# Patient Record
Sex: Male | Born: 1960 | Race: White | Hispanic: No | Marital: Married | Smoking: Never smoker
Health system: Southern US, Community
[De-identification: ages and names within clinical notes are randomized; demographics above are authoritative.]

## PROBLEM LIST (undated history)

## (undated) DIAGNOSIS — K56609 Unspecified intestinal obstruction, unspecified as to partial versus complete obstruction: Secondary | ICD-10-CM

## (undated) DIAGNOSIS — R7303 Prediabetes: Secondary | ICD-10-CM

## (undated) DIAGNOSIS — I7121 Aneurysm of the ascending aorta, without rupture: Secondary | ICD-10-CM

## (undated) DIAGNOSIS — I519 Heart disease, unspecified: Secondary | ICD-10-CM

## (undated) DIAGNOSIS — M069 Rheumatoid arthritis, unspecified: Secondary | ICD-10-CM

## (undated) DIAGNOSIS — R600 Localized edema: Secondary | ICD-10-CM

## (undated) DIAGNOSIS — I10 Essential (primary) hypertension: Secondary | ICD-10-CM

## (undated) DIAGNOSIS — E669 Obesity, unspecified: Secondary | ICD-10-CM

## (undated) DIAGNOSIS — M1990 Unspecified osteoarthritis, unspecified site: Secondary | ICD-10-CM

## (undated) DIAGNOSIS — E785 Hyperlipidemia, unspecified: Secondary | ICD-10-CM

## (undated) DIAGNOSIS — R079 Chest pain, unspecified: Secondary | ICD-10-CM

## (undated) DIAGNOSIS — L0390 Cellulitis, unspecified: Secondary | ICD-10-CM

## (undated) DIAGNOSIS — I712 Thoracic aortic aneurysm, without rupture: Secondary | ICD-10-CM

## (undated) DIAGNOSIS — K219 Gastro-esophageal reflux disease without esophagitis: Secondary | ICD-10-CM

## (undated) DIAGNOSIS — J189 Pneumonia, unspecified organism: Secondary | ICD-10-CM

## (undated) DIAGNOSIS — K760 Fatty (change of) liver, not elsewhere classified: Secondary | ICD-10-CM

## (undated) DIAGNOSIS — M549 Dorsalgia, unspecified: Secondary | ICD-10-CM

## (undated) DIAGNOSIS — K769 Liver disease, unspecified: Secondary | ICD-10-CM

## (undated) DIAGNOSIS — G4730 Sleep apnea, unspecified: Secondary | ICD-10-CM

## (undated) DIAGNOSIS — Z87442 Personal history of urinary calculi: Secondary | ICD-10-CM

## (undated) DIAGNOSIS — M2550 Pain in unspecified joint: Secondary | ICD-10-CM

## (undated) HISTORY — PX: HERNIA REPAIR: SHX51

## (undated) HISTORY — DX: Obesity, unspecified: E66.9

## (undated) HISTORY — DX: Dorsalgia, unspecified: M54.9

## (undated) HISTORY — PX: JOINT REPLACEMENT: SHX530

## (undated) HISTORY — DX: Rheumatoid arthritis, unspecified: M06.9

## (undated) HISTORY — DX: Heart disease, unspecified: I51.9

## (undated) HISTORY — DX: Cellulitis, unspecified: L03.90

## (undated) HISTORY — DX: Fatty (change of) liver, not elsewhere classified: K76.0

## (undated) HISTORY — DX: Liver disease, unspecified: K76.9

## (undated) HISTORY — DX: Localized edema: R60.0

## (undated) HISTORY — DX: Prediabetes: R73.03

## (undated) HISTORY — DX: Pain in unspecified joint: M25.50

---

## 2000-07-16 ENCOUNTER — Inpatient Hospital Stay (HOSPITAL_COMMUNITY): Admission: EM | Admit: 2000-07-16 | Discharge: 2000-07-18 | Payer: Self-pay | Admitting: Emergency Medicine

## 2000-07-16 ENCOUNTER — Encounter: Payer: Self-pay | Admitting: Emergency Medicine

## 2000-07-17 ENCOUNTER — Encounter: Payer: Self-pay | Admitting: Internal Medicine

## 2004-10-26 ENCOUNTER — Ambulatory Visit: Payer: Self-pay | Admitting: Family Medicine

## 2008-02-15 ENCOUNTER — Other Ambulatory Visit: Payer: Self-pay

## 2008-02-15 ENCOUNTER — Inpatient Hospital Stay: Payer: Self-pay | Admitting: Surgery

## 2008-02-27 ENCOUNTER — Ambulatory Visit: Payer: Self-pay | Admitting: Surgery

## 2009-08-15 ENCOUNTER — Emergency Department (HOSPITAL_COMMUNITY): Admission: EM | Admit: 2009-08-15 | Discharge: 2009-08-16 | Payer: Self-pay | Admitting: Emergency Medicine

## 2009-12-24 ENCOUNTER — Ambulatory Visit (HOSPITAL_COMMUNITY): Admission: RE | Admit: 2009-12-24 | Discharge: 2009-12-24 | Payer: Self-pay | Admitting: Family Medicine

## 2010-01-24 ENCOUNTER — Emergency Department (HOSPITAL_COMMUNITY): Admission: EM | Admit: 2010-01-24 | Discharge: 2010-01-24 | Payer: Self-pay | Admitting: Emergency Medicine

## 2010-02-02 ENCOUNTER — Ambulatory Visit: Payer: Self-pay | Admitting: Gastroenterology

## 2010-03-03 ENCOUNTER — Ambulatory Visit: Payer: Self-pay | Admitting: Gastroenterology

## 2010-03-08 LAB — PATHOLOGY REPORT

## 2010-09-06 ENCOUNTER — Emergency Department (HOSPITAL_COMMUNITY)
Admission: EM | Admit: 2010-09-06 | Discharge: 2010-09-07 | Payer: Self-pay | Source: Home / Self Care | Admitting: Emergency Medicine

## 2010-09-12 LAB — URINE MICROSCOPIC-ADD ON

## 2010-09-12 LAB — DIFFERENTIAL
Basophils Absolute: 0 10*3/uL (ref 0.0–0.1)
Basophils Relative: 0 % (ref 0–1)
Eosinophils Absolute: 0 10*3/uL (ref 0.0–0.7)
Eosinophils Relative: 0 % (ref 0–5)
Lymphocytes Relative: 5 % — ABNORMAL LOW (ref 12–46)
Lymphs Abs: 0.6 10*3/uL — ABNORMAL LOW (ref 0.7–4.0)
Monocytes Absolute: 1 10*3/uL (ref 0.1–1.0)
Monocytes Relative: 7 % (ref 3–12)
Neutro Abs: 11.8 10*3/uL — ABNORMAL HIGH (ref 1.7–7.7)
Neutrophils Relative %: 88 % — ABNORMAL HIGH (ref 43–77)

## 2010-09-12 LAB — LACTIC ACID, PLASMA
Lactic Acid, Venous: 2.4 mmol/L — ABNORMAL HIGH (ref 0.5–2.2)
Lactic Acid, Venous: 1.5 mmol/L (ref 0.5–2.2)

## 2010-09-12 LAB — CBC
HCT: 51.9 % (ref 39.0–52.0)
Hemoglobin: 18.2 g/dL — ABNORMAL HIGH (ref 13.0–17.0)
MCH: 31.8 pg (ref 26.0–34.0)
MCHC: 35.1 g/dL (ref 30.0–36.0)
MCV: 90.6 fL (ref 78.0–100.0)
Platelets: 165 10*3/uL (ref 150–400)
RBC: 5.73 MIL/uL (ref 4.22–5.81)
RDW: 13.1 % (ref 11.5–15.5)
WBC: 13.4 10*3/uL — ABNORMAL HIGH (ref 4.0–10.5)

## 2010-09-12 LAB — URINALYSIS, ROUTINE W REFLEX MICROSCOPIC
Ketones, ur: NEGATIVE mg/dL
Leukocytes, UA: NEGATIVE
Nitrite: NEGATIVE
Protein, ur: 30 mg/dL — AB
Specific Gravity, Urine: 1.026 (ref 1.005–1.030)
Urine Glucose, Fasting: NEGATIVE mg/dL
Urobilinogen, UA: 0.2 mg/dL (ref 0.0–1.0)
pH: 5.5 (ref 5.0–8.0)

## 2010-09-12 LAB — COMPREHENSIVE METABOLIC PANEL
ALT: 82 U/L — ABNORMAL HIGH (ref 0–53)
AST: 46 U/L — ABNORMAL HIGH (ref 0–37)
Albumin: 4.1 g/dL (ref 3.5–5.2)
Alkaline Phosphatase: 63 U/L (ref 39–117)
BUN: 16 mg/dL (ref 6–23)
CO2: 26 mEq/L (ref 19–32)
Calcium: 9.1 mg/dL (ref 8.4–10.5)
Chloride: 101 mEq/L (ref 96–112)
Creatinine, Ser: 1.07 mg/dL (ref 0.4–1.5)
GFR calc Af Amer: >60 mL/min (ref >60)
GFR calc non Af Amer: >60 mL/min (ref >60)
Glucose, Bld: 156 mg/dL — ABNORMAL HIGH (ref 70–99)
Potassium: 3.8 mEq/L (ref 3.5–5.1)
Sodium: 136 mEq/L (ref 135–145)
Total Bilirubin: 1.3 mg/dL — ABNORMAL HIGH (ref 0.3–1.2)
Total Protein: 7.1 g/dL (ref 6.0–8.3)

## 2010-09-12 LAB — LIPASE, BLOOD: Lipase: 17 U/L (ref 11–59)

## 2010-10-28 ENCOUNTER — Other Ambulatory Visit: Payer: Self-pay | Admitting: Specialist

## 2010-10-28 ENCOUNTER — Ambulatory Visit
Admission: RE | Admit: 2010-10-28 | Discharge: 2010-10-28 | Disposition: A | Discharge status: Home / Self Care | Payer: No Typology Code available for payment source | Source: Ambulatory Visit | Attending: Specialist | Admitting: Specialist

## 2010-10-28 DIAGNOSIS — M542 Cervicalgia: Secondary | ICD-10-CM

## 2010-11-14 LAB — POCT CARDIAC MARKERS
CKMB, poc: 1 ng/mL (ref 1.0–8.0)
CKMB, poc: 1.1 ng/mL (ref 1.0–8.0)
Myoglobin, poc: 69.9 ng/mL (ref 12–200)
Myoglobin, poc: 77 ng/mL (ref 12–200)
Troponin i, poc: <0.05 ng/mL (ref 0.00–0.09)
Troponin i, poc: <0.05 ng/mL (ref 0.00–0.09)

## 2010-11-14 LAB — DIFFERENTIAL
Basophils Absolute: 0 10*3/uL (ref 0.0–0.1)
Basophils Relative: 1 % (ref 0–1)
Eosinophils Absolute: 0.1 10*3/uL (ref 0.0–0.7)
Eosinophils Relative: 1 % (ref 0–5)
Lymphocytes Relative: 31 % (ref 12–46)
Lymphs Abs: 1.9 10*3/uL (ref 0.7–4.0)
Monocytes Absolute: 0.5 10*3/uL (ref 0.1–1.0)
Monocytes Relative: 9 % (ref 3–12)
Neutro Abs: 3.6 10*3/uL (ref 1.7–7.7)
Neutrophils Relative %: 59 % (ref 43–77)

## 2010-11-14 LAB — CBC
HCT: 44.7 % (ref 39.0–52.0)
Hemoglobin: 15.4 g/dL (ref 13.0–17.0)
MCHC: 34.6 g/dL (ref 30.0–36.0)
MCV: 91.7 fL (ref 78.0–100.0)
Platelets: 149 10*3/uL — ABNORMAL LOW (ref 150–400)
RBC: 4.87 MIL/uL (ref 4.22–5.81)
RDW: 12.8 % (ref 11.5–15.5)
WBC: 6.1 10*3/uL (ref 4.0–10.5)

## 2010-11-14 LAB — URINE MICROSCOPIC-ADD ON

## 2010-11-14 LAB — COMPREHENSIVE METABOLIC PANEL
ALT: 53 U/L (ref 0–53)
AST: 35 U/L (ref 0–37)
Albumin: 3.8 g/dL (ref 3.5–5.2)
Alkaline Phosphatase: 61 U/L (ref 39–117)
BUN: 16 mg/dL (ref 6–23)
CO2: 26 mEq/L (ref 19–32)
Calcium: 8.8 mg/dL (ref 8.4–10.5)
Chloride: 106 mEq/L (ref 96–112)
Creatinine, Ser: 0.79 mg/dL (ref 0.4–1.5)
GFR calc Af Amer: >60 mL/min (ref >60)
GFR calc non Af Amer: >60 mL/min (ref >60)
Glucose, Bld: 98 mg/dL (ref 70–99)
Potassium: 3.5 mEq/L (ref 3.5–5.1)
Sodium: 138 mEq/L (ref 135–145)
Total Bilirubin: 0.5 mg/dL (ref 0.3–1.2)
Total Protein: 6.8 g/dL (ref 6.0–8.3)

## 2010-11-14 LAB — URINALYSIS, ROUTINE W REFLEX MICROSCOPIC
Bilirubin Urine: NEGATIVE
Glucose, UA: NEGATIVE mg/dL
Ketones, ur: NEGATIVE mg/dL
Leukocytes, UA: NEGATIVE
Nitrite: NEGATIVE
Protein, ur: NEGATIVE mg/dL
Specific Gravity, Urine: 1.019 (ref 1.005–1.030)
Urobilinogen, UA: 1 mg/dL (ref 0.0–1.0)
pH: 6 (ref 5.0–8.0)

## 2010-11-14 LAB — LIPASE, BLOOD: Lipase: 25 U/L (ref 11–59)

## 2010-11-28 LAB — DIFFERENTIAL
Basophils Absolute: 0.1 10*3/uL (ref 0.0–0.1)
Basophils Relative: 0 % (ref 0–1)
Eosinophils Absolute: 0.1 10*3/uL (ref 0.0–0.7)
Eosinophils Relative: 0 % (ref 0–5)
Lymphocytes Relative: 7 % — ABNORMAL LOW (ref 12–46)
Lymphs Abs: 1 10*3/uL (ref 0.7–4.0)
Monocytes Absolute: 1 10*3/uL (ref 0.1–1.0)
Monocytes Relative: 7 % (ref 3–12)
Neutro Abs: 12.9 10*3/uL — ABNORMAL HIGH (ref 1.7–7.7)
Neutrophils Relative %: 86 % — ABNORMAL HIGH (ref 43–77)

## 2010-11-28 LAB — COMPREHENSIVE METABOLIC PANEL
ALT: 47 U/L (ref 0–53)
AST: 66 U/L — ABNORMAL HIGH (ref 0–37)
Albumin: 4.3 g/dL (ref 3.5–5.2)
Alkaline Phosphatase: 65 U/L (ref 39–117)
BUN: 18 mg/dL (ref 6–23)
CO2: 24 mEq/L (ref 19–32)
Calcium: 9.2 mg/dL (ref 8.4–10.5)
Chloride: 100 mEq/L (ref 96–112)
Creatinine, Ser: 1.52 mg/dL — ABNORMAL HIGH (ref 0.4–1.5)
GFR calc Af Amer: 60 mL/min — ABNORMAL LOW (ref >60)
GFR calc non Af Amer: 49 mL/min — ABNORMAL LOW (ref >60)
Glucose, Bld: 171 mg/dL — ABNORMAL HIGH (ref 70–99)
Potassium: 5.5 mEq/L — ABNORMAL HIGH (ref 3.5–5.1)
Sodium: 136 mEq/L (ref 135–145)
Total Bilirubin: 2.4 mg/dL — ABNORMAL HIGH (ref 0.3–1.2)
Total Protein: 7 g/dL (ref 6.0–8.3)

## 2010-11-28 LAB — CBC
HCT: 51.4 % (ref 39.0–52.0)
Hemoglobin: 17.9 g/dL — ABNORMAL HIGH (ref 13.0–17.0)
MCHC: 34.8 g/dL (ref 30.0–36.0)
MCV: 91.8 fL (ref 78.0–100.0)
Platelets: 168 10*3/uL (ref 150–400)
RBC: 5.6 MIL/uL (ref 4.22–5.81)
RDW: 12.5 % (ref 11.5–15.5)
WBC: 15 10*3/uL — ABNORMAL HIGH (ref 4.0–10.5)

## 2010-11-28 LAB — URINE MICROSCOPIC-ADD ON

## 2010-11-28 LAB — LIPASE, BLOOD: Lipase: 15 U/L (ref 11–59)

## 2010-11-28 LAB — POCT CARDIAC MARKERS
CKMB, poc: 2.2 ng/mL (ref 1.0–8.0)
Myoglobin, poc: 132 ng/mL (ref 12–200)
Troponin i, poc: <0.05 ng/mL (ref 0.00–0.09)

## 2010-11-28 LAB — URINALYSIS, ROUTINE W REFLEX MICROSCOPIC
Bilirubin Urine: NEGATIVE
Glucose, UA: NEGATIVE mg/dL
Ketones, ur: NEGATIVE mg/dL
Leukocytes, UA: NEGATIVE
Nitrite: NEGATIVE
Protein, ur: 30 mg/dL — AB
Specific Gravity, Urine: >1.046 — ABNORMAL HIGH (ref 1.005–1.030)
Urobilinogen, UA: 0.2 mg/dL (ref 0.0–1.0)
pH: 6 (ref 5.0–8.0)

## 2011-01-13 NOTE — H&P (Signed)
[place]. [facility]  Patient:    Keith Vance, Keith Vance                         MRN: [id number] Adm. Date:  [id number] Attending:  Dyanne Carrel CC:         Dr. Cay Schillings in [place]   History and Physical  ADMITTING DIAGNOSES: 1. Abdominal pain. 2. Nausea, vomiting, and diarrhea. 3. Dehydration.  HISTORY:  The patient is a 50 year old white male with a one-week history of intermittent nausea, vomiting, and diarrhea with epigastric pain.  His symptoms began approximately July 10, 2000, and by the morning of November 14, the patient was in severe pain.  He went to [facility] and was evaluated there.  The patient states he had an NG tube, IV fluids, x-rays, and an abdominal ultrasound.  He recalls the two doctors disagreed - one saying that he had "a blockage" and the other saying that he had an infection. The patient was discharged on July 12, 2000 (I am not clear if he ever was admitted or just seen in the ER) and improved with no specific treatment other than pain medication.  He felt much better by the next day, and continued to improve until today.  Yesterday, he says, he actually felt pretty good.  By earlier today, he had a recurrence of his nausea, vomiting, diarrhea, and epigastric pain and came to [facility] ER.  In the emergency room, the patient was afebrile with a temperature of 98.3.  His laboratory studies were basically normal, except for a white count of 23.8 and a moderately-elevated SGPT at 52. His CT of abdomen showed a probable ileus.  Patient was requiring repeated doses of morphine for pain relief.  He will be admitted for nausea, vomiting, diarrhea, and abdominal pain.  The patient states his epigastric pain is constant and burning and nonradiating.  He denies any hematemesis, melena, or rectal bleeding.  He has no prior history of similar episodes or symptoms.  The patient did have an oral antibiotics  about three weeks ago for a penile rash, which cleared with treatment.  He is unsure of the name of the medication.  The patients wife and two children moved into a new house in [place] about a month ago.  They are on well water.  Apparently, the patient is really the only one of the family that drinks the well water.  No other family members are sick.  PAST MEDICAL HISTORY: 1. Hypertension, diagnosed three months ago.  Treated with Maxzide.  He has    been on no other antihypertensives. 2. Status post umbilical hernia repair eight years ago. 3. Status post right inguinal herniorrhaphy at age 50.  ALLERGIES:  None.  MEDICATIONS:  Maxzide 75 q.d.  FAMILY HISTORY:  His father had hypertension and diabetes.  His mother is alive and well, and there is no GI history in the family.  SOCIAL HISTORY:  He is married and lives in [place].  See above.  He is a Psychologist, occupational.  He does not drink or smoke.  REVIEW OF SYSTEMS:  As stated above, with the addition that he denies much fever, maybe 99 to 100 occasionally.  He denies any cough, chest pain, or dyspnea.  PHYSICAL EXAMINATION:  GENERAL:  The patient is alert and oriented and in mild discomfort.  He had just previously had morphine 5 mg before I arrived.  VITAL SIGNS:  Temperature  98.3, blood pressure 153/91, pulse 86, respiratory rate 20.  HEENT:  Dry mucous membranes.  NECK:  No mass or JVD.  HEART:  Regular rate and rhythm without murmur.  LUNGS:  Clear.  ABDOMEN:  Soft with a tender epigastrium, but no other areas of tenderness. His bowel sounds are hypoactive to absent, and he has no masses, rebound, or guarding.  RECTAL:  Reveals no masses and heme negative stool.  LABORATORY DATA:  White count 23.8 with a hemoglobin of 17.6 and a platelet count of 332,000.  Urinalysis is negative except for a specific gravity of 1.040.  Chemistry shows sodium 139, potassium 3.3, bicarb 31, chloride 100, BUN 18, creatinine 1.0, glucose  117.  His SGOT is normal at 33 and his SGPT is mildly elevated at 52, with normal being 0-42.  His alkaline phosphatase is normal at 67, and his total bilirubin is 1.3, with normal being 0.0-1.2.  His amylase is normal at 50 (normal 27-131) and lipase is normal at 25 (normal 22-51).  His CT shows an ileus with a question of an early small bowel obstruction, though the radiologist is favoring an ileus.  There are no masses and no apparent gallbladder disease.  IMPRESSION:  The patient is a 50 year old white male with a one-week history of intermittent nausea, vomiting, and diarrhea with epigastric pain.  His labs show a leukocytosis and his CT scan is consistent with an ileus.  I am unclear of the etiology at this point.  He will be admitted, maintained on IV fluids, and given medication for pain control.  Stool studies will be obtained, including culture, O&P, and C difficile toxin, given his previous antibiotic use.  We will hold his blood pressure medication, especially as it is a diuretic and he appears mildly dehydrated.  Will follow clinically and repeat his labs.  We will consider further evaluation including abdominal ultrasound or x-rays in another day or two, depending on his clinical course. DD:  07/16/00 TD:  07/17/00 Job: [id number]

## 2011-01-13 NOTE — Discharge Summary (Signed)
[place]. [facility] [place]  Patient:    Keith Vance, Keith Vance                         MRN: [id number] Adm. Date:  [id number] Disc. Date: [id number] Attending:  Dyanne Carrel CC:         Roosvelt Harps, M.D.  Marnee Spring. Wiliam Ke, M.D.  Cay Schillings, M.D., [place], [place]   Discharge Summary  DISCHARGE DIAGNOSES: 1. Ileus, presenting with nausea, vomiting and epigastric pain,    recurrent. 2. Hypertension. 3. Status post umbilical hernia repair eight years ago. 4. Right inguinal hernia repair at 50 years of age.  DISCHARGE MEDICATIONS: 1. Maxzide 75/50 one p.o. q.d. 2. Protonics 40 mg p.o. q.d.  PROCEDURES:  Contracted abdominal CT and pelvic CT consistent with nonspecific ileus with fluid levels in both the small and large bowel.  Abdominal series revealing right lower lobe atelectasis and a left upper quadrant ileus.  CONSULTATIONS:  None.  FOLLOW-UP APPOINTMENT:  Dr. Cay Schillings, [place], [facility] in one to weeks.  Outpatient colonoscopy with Dr. Luther Parody on Tuesday, July 31, 2000, at 9:30 a.m.  DISCHARGE CONDITION:  The patient had stable vital signs, tolerated two full meals with resolution of his nausea, vomiting and abdominal pain.  He is eager to go home.  REASON FOR ADMISSION:  The patient is a 50 year old white male with a one week history of intermittent nausea, vomiting and diarrhea associated with severe epigastric pain.  He notes that his symptoms began approximately one week prior to admission and indeed the patient was admitted overnight at [facility] at which time he had NG tube placed and received IV fluids. He was discharged shortly thereafter after resolution of his symptoms and he reports feeling quite well for several days until recurrence of symptoms the day of admission to [facility].  The patient describes his pain as located in the epigastrium, nonradiating but sharp, burning, constant.  He denied any GI  bleed in the form of hematemesis, melena, hematochezia and he denied any similar prior episodes.  He did report receiving a course of antibiotics approximately three weeks prior to admission for a penile rash which cleared up.  He reports moving into a new home with well water, however, denying drinking well water.  REVIEW OF SYSTEMS:  The patient denies any weight loss or change in stool.  He did endorse very slight intermittent hematochezia after straining.  He did endorse occasional Advil use up to three tablets in a 24 hour period for musculoskeletal aches and pains.  He is a nondrinker, nonsmoker.  His review of systems was otherwise negative.  FAMILY HISTORY:  Negative for inflammatory bowel disease, colon cancer.  LABORATORY DATA:  His evaluation in the emergency room revealed a leukocytosis of 23.8, hemoglobin of 17.6 and hematocrit of 48.3.  Amylase and lipase were within normal limits.  Liver function tests:  AST was 33, ALT 52, alkaline phosphatase 67, total bilirubin 1.3, lipase 25.  HOSPITAL COURSE: #1 - ILEUS ASSOCIATED WITH NAUSEA, VOMITING, DIARRHEA AND EPIGASTRIC PAIN AS WELL AS LEUKOCYTOSIS:  The patient was admitted for recurrence of these symptoms.  Differential diagnosis included intermittent bowel obstruction secondary to adhesions, colitis either inflammatory or infectious, especially given recent course of antibiotics, gastroenteritis, peptic ulcer disease, pancreatitis.  Following admission, a KUB was obtained which was consistent with a left upper quadrant ileus.   Helicobacter pylorus antibodies were normal at 1.2.  Stool studies were  obtained.  There was no evidence of fecal leukocytes and a C. difficile panel was sent and is pending.  As noted above, his lipase and amylase were within normal limits making pancreatitis unlikely. The patient was started on a proton pump inhibitor.  Through the course of his hospitalization on conservative management, IV  fluids and pain medications, the patient improved symptomatically to the point where he could tolerate a full diet without any symptoms.  He was therefore deemed ready for discharge given that his problem was recurrent and the etiology has yet to be clearly defined.  It was recommended that he be seen as an outpatient by GI for endoscopy to evaluate the colon and possibly upper GI tract.  Dr. Wiliam Ke of general surgery was also called regarding this patient at the patients request.  However, the patient was discharged prior to Dr. Althea Grimmer consultation.  Should the patient require surgery, the patients family does have a prior relationship with Dr. Wiliam Ke.  Of note, the patients discharge hematocrit is 43.7 and hemoglobin 16.3 and the leukocytosis quickly resolved after admission.  Discharge white count was 8.1 and that is without any antibiotic therapy. DD:  07/18/00 TD:  07/19/00 Job: [id number]

## 2012-02-12 ENCOUNTER — Emergency Department (HOSPITAL_COMMUNITY)
Admission: EM | Admit: 2012-02-12 | Discharge: 2012-02-12 | Disposition: A | Discharge status: Home / Self Care | Payer: BC Managed Care – PPO | Attending: Emergency Medicine | Admitting: Emergency Medicine

## 2012-02-12 ENCOUNTER — Encounter (HOSPITAL_COMMUNITY): Payer: Self-pay | Admitting: Emergency Medicine

## 2012-02-12 DIAGNOSIS — T7840XA Allergy, unspecified, initial encounter: Secondary | ICD-10-CM | POA: Insufficient documentation

## 2012-02-12 DIAGNOSIS — I10 Essential (primary) hypertension: Secondary | ICD-10-CM | POA: Insufficient documentation

## 2012-02-12 HISTORY — DX: Essential (primary) hypertension: I10

## 2012-02-12 HISTORY — DX: Hyperlipidemia, unspecified: E78.5

## 2012-02-12 MED ORDER — ONDANSETRON HCL 4 MG/2ML IJ SOLN
4.0000 mg | Freq: Once | INTRAMUSCULAR | Status: AC
  Administered 2012-02-12: 4 mg via INTRAVENOUS

## 2012-02-12 MED ORDER — ONDANSETRON HCL 4 MG/2ML IJ SOLN
INTRAMUSCULAR | Status: AC
  Administered 2012-02-12: 4 mg via INTRAVENOUS
  Filled 2012-02-12: qty 2

## 2012-02-12 MED ORDER — SODIUM CHLORIDE 0.9 % IV BOLUS (SEPSIS)
1000.0000 mL | Freq: Once | INTRAVENOUS | Status: AC
  Administered 2012-02-12: 1000 mL via INTRAVENOUS

## 2012-02-12 MED ORDER — DIPHENHYDRAMINE HCL 25 MG PO CAPS: 25.0000 mg | ORAL_CAPSULE | Freq: Four times a day (QID) | ORAL | Status: DC | PRN

## 2012-02-12 MED ORDER — DIPHENHYDRAMINE HCL 50 MG/ML IJ SOLN
25.0000 mg | Freq: Once | INTRAMUSCULAR | Status: AC
  Administered 2012-02-12: 25 mg via INTRAVENOUS
  Filled 2012-02-12: qty 1

## 2012-02-12 MED ORDER — METHYLPREDNISOLONE SODIUM SUCC 125 MG IJ SOLR
125.0000 mg | Freq: Once | INTRAMUSCULAR | Status: AC
  Administered 2012-02-12: 125 mg via INTRAVENOUS
  Filled 2012-02-12: qty 2

## 2012-02-12 MED ORDER — PREDNISONE 10 MG PO TABS: 20.0000 mg | ORAL_TABLET | Freq: Every day | ORAL | Status: DC

## 2012-02-12 MED ORDER — FAMOTIDINE IN NACL 20-0.9 MG/50ML-% IV SOLN
20.0000 mg | Freq: Once | INTRAVENOUS | Status: AC
Start: 2012-02-12 — End: 2012-02-12
  Administered 2012-02-12: 20 mg via INTRAVENOUS
  Filled 2012-02-12: qty 50

## 2012-02-12 MED ORDER — FAMOTIDINE 20 MG PO TABS: 20.0000 mg | ORAL_TABLET | Freq: Two times a day (BID) | ORAL | Status: DC

## 2012-02-12 NOTE — ED Notes (Signed)
Pt now sleeping, appears in no acute distress, Vital signs WDL.  Lung sounds remain clear.

## 2012-02-12 NOTE — ED Notes (Signed)
[id number]<AV> Expected date:<BR> Expected time: 5:23 PM<BR> Means of arrival:<BR> Comments:<BR> M31 - 51yoM allergic reaction, hives only

## 2012-02-12 NOTE — ED Notes (Signed)
Pt. Reports no itching or discomfort now.

## 2012-02-12 NOTE — ED Provider Notes (Signed)
History     CSN: [id number]  Arrival date & time 02/12/12  1737   First MD Initiated Contact with Patient 02/12/12 1746      Chief Complaint  Patient presents with  . Allergic Reaction    (Consider location/radiation/quality/duration/timing/severity/associated sxs/prior treatment) HPI Pt with allergic reaction this evening that started with hive to feet and quickly covered entire body. Mild lip swelling. No SOB, wheezing. Pt states he ate oysters last night and used cleaning compound. Neither are new exposure to him. No previous allergic reactions Past Medical History  Diagnosis Date  . Hypertension   . Hyperlipidemia     History reviewed. No pertinent past surgical history.  History reviewed. No pertinent family history.  History  Substance Use Topics  . Smoking status: Not on file  . Smokeless tobacco: Not on file  . Alcohol Use:       Review of Systems  Constitutional: Negative for fever and chills.  HENT: Positive for facial swelling. Negative for trouble swallowing and neck pain.   Respiratory: Negative for cough, shortness of breath and wheezing.   Cardiovascular: Negative for chest pain, palpitations and leg swelling.  Gastrointestinal: Negative for abdominal pain.  Musculoskeletal: Negative for back pain.  Skin: Positive for rash. Negative for pallor and wound.    Allergies  Review of patient's allergies indicates no known allergies.  Home Medications   Current Outpatient Rx  Name Route Sig Dispense Refill  . LISINOPRIL-HYDROCHLOROTHIAZIDE 20-25 MG PO TABS Oral Take 1 tablet by mouth daily.    [name] SIMVASTATIN 20 MG PO TABS Oral Take 20 mg by mouth every evening.    [name] DIPHENHYDRAMINE HCL 25 MG PO CAPS Oral Take 1 capsule (25 mg total) by mouth every 6 (six) hours as needed for itching. 30 capsule 0  . FAMOTIDINE 20 MG PO TABS Oral Take 1 tablet (20 mg total) by mouth 2 (two) times daily. 30 tablet 0  . PREDNISONE 10 MG PO TABS Oral Take 2 tablets (20 mg  total) by mouth daily. 10 tablet 0    BP 143/96  Pulse 97  Temp 97.7 F (36.5 C) (Oral)  Resp 20  SpO2 98%  Physical Exam  Nursing note and vitals reviewed. Constitutional: He is oriented to person, place, and time. He appears well-developed and well-nourished. No distress.  HENT:  Head: Normocephalic and atraumatic.  Mouth/Throat: Oropharynx is clear and moist.       Mild lower lip swelling. O/P clear. No tongue swelling  Eyes: EOM are normal. Pupils are equal, round, and reactive to light.  Neck: Normal range of motion. Neck supple.  Cardiovascular: Normal rate and regular rhythm.   Pulmonary/Chest: Effort normal and breath sounds normal. No stridor. No respiratory distress. He has no wheezes. He has no rales.  Abdominal: Soft. Bowel sounds are normal. There is no tenderness. There is no rebound and no guarding.  Musculoskeletal: Normal range of motion. He exhibits no edema and no tenderness.  Neurological: He is alert and oriented to person, place, and time.       5/5 motor in all ext. Sensation intact  Skin: Skin is warm and dry. Rash (raised, erythematous rash covering body consistent with urticaria) noted. No erythema.  Psychiatric: He has a normal mood and affect. His behavior is normal.    ED Course  Procedures (including critical care time)  Labs Reviewed - No data to display No results found.   1. Allergic reaction       MDM  Pt with significant improvement. F/u with PMD. Return for any worsening of symptoms       Loren Racer, MD 02/12/12 2321

## 2012-02-12 NOTE — ED Notes (Signed)
Pt called out stating he felt like he was having a hard time breathing.  Upon entering room, pt began dry heaving.  Lung sounds clear on auscultation, O2 sats 98%.  Nausea medication ordered per protocol.

## 2012-02-12 NOTE — ED Notes (Addendum)
Per EMS, pt with hives all over body.  Pt had oysters last night (states he eats oysters and shell fish frequently without reaction) but denies any new medications, detergents, lotions, etc. Pt states hives appeared suddenly at 4:15pm today.  Pt with clear lung sounds, denies SOB.  Pt given 50mg  Benadryl PO per EMS.  During assessment, pt noted to have hives and swelling to bilateral arms, legs, abd, and face.  Pt reports severe itching.

## 2012-02-12 NOTE — ED Notes (Signed)
Rechecked BP 143/96

## 2012-02-12 NOTE — Discharge Instructions (Signed)

## 2012-03-07 DIAGNOSIS — R198 Other specified symptoms and signs involving the digestive system and abdomen: Secondary | ICD-10-CM | POA: Insufficient documentation

## 2012-03-07 DIAGNOSIS — R7989 Other specified abnormal findings of blood chemistry: Secondary | ICD-10-CM | POA: Insufficient documentation

## 2012-03-07 DIAGNOSIS — G4730 Sleep apnea, unspecified: Secondary | ICD-10-CM | POA: Insufficient documentation

## 2012-06-07 ENCOUNTER — Ambulatory Visit: Payer: BC Managed Care – PPO | Admitting: Internal Medicine

## 2015-02-27 ENCOUNTER — Inpatient Hospital Stay (HOSPITAL_COMMUNITY)
Admission: EM | Admit: 2015-02-27 | Discharge: 2015-03-01 | DRG: 390 | Disposition: A | Discharge status: Home / Self Care | Payer: No Typology Code available for payment source | Attending: Internal Medicine | Admitting: Internal Medicine

## 2015-02-27 ENCOUNTER — Encounter (HOSPITAL_COMMUNITY): Payer: Self-pay

## 2015-02-27 ENCOUNTER — Emergency Department (HOSPITAL_COMMUNITY): Payer: No Typology Code available for payment source

## 2015-02-27 DIAGNOSIS — E785 Hyperlipidemia, unspecified: Secondary | ICD-10-CM | POA: Diagnosis present

## 2015-02-27 DIAGNOSIS — R1033 Periumbilical pain: Secondary | ICD-10-CM

## 2015-02-27 DIAGNOSIS — I10 Essential (primary) hypertension: Secondary | ICD-10-CM | POA: Diagnosis present

## 2015-02-27 DIAGNOSIS — K529 Noninfective gastroenteritis and colitis, unspecified: Secondary | ICD-10-CM | POA: Diagnosis present

## 2015-02-27 DIAGNOSIS — Z6838 Body mass index (BMI) 38.0-38.9, adult: Secondary | ICD-10-CM

## 2015-02-27 DIAGNOSIS — K5669 Other intestinal obstruction: Secondary | ICD-10-CM

## 2015-02-27 DIAGNOSIS — K56609 Unspecified intestinal obstruction, unspecified as to partial versus complete obstruction: Secondary | ICD-10-CM

## 2015-02-27 DIAGNOSIS — E669 Obesity, unspecified: Secondary | ICD-10-CM | POA: Diagnosis present

## 2015-02-27 DIAGNOSIS — R109 Unspecified abdominal pain: Secondary | ICD-10-CM | POA: Diagnosis present

## 2015-02-27 DIAGNOSIS — K5660 Unspecified intestinal obstruction: Principal | ICD-10-CM | POA: Diagnosis present

## 2015-02-27 DIAGNOSIS — Z8719 Personal history of other diseases of the digestive system: Secondary | ICD-10-CM | POA: Diagnosis present

## 2015-02-27 DIAGNOSIS — D72829 Elevated white blood cell count, unspecified: Secondary | ICD-10-CM | POA: Diagnosis present

## 2015-02-27 DIAGNOSIS — R1110 Vomiting, unspecified: Secondary | ICD-10-CM

## 2015-02-27 LAB — URINALYSIS, ROUTINE W REFLEX MICROSCOPIC
Bilirubin Urine: NEGATIVE
Glucose, UA: NEGATIVE mg/dL
Ketones, ur: NEGATIVE mg/dL
Leukocytes, UA: NEGATIVE
Nitrite: NEGATIVE
Protein, ur: NEGATIVE mg/dL
Specific Gravity, Urine: 1.025 (ref 1.005–1.030)
Urobilinogen, UA: 0.2 mg/dL (ref 0.0–1.0)
pH: 5 (ref 5.0–8.0)

## 2015-02-27 LAB — CBC WITH DIFFERENTIAL/PLATELET
Basophils Absolute: 0 10*3/uL (ref 0.0–0.1)
Basophils Relative: 0 % (ref 0–1)
Eosinophils Absolute: 0.1 10*3/uL (ref 0.0–0.7)
Eosinophils Relative: 1 % (ref 0–5)
HCT: 45.9 % (ref 39.0–52.0)
Hemoglobin: 15.9 g/dL (ref 13.0–17.0)
Lymphocytes Relative: 12 % (ref 12–46)
Lymphs Abs: 1.6 10*3/uL (ref 0.7–4.0)
MCH: 31.1 pg (ref 26.0–34.0)
MCHC: 34.6 g/dL (ref 30.0–36.0)
MCV: 89.6 fL (ref 78.0–100.0)
Monocytes Absolute: 0.9 10*3/uL (ref 0.1–1.0)
Monocytes Relative: 6 % (ref 3–12)
Neutro Abs: 11.4 10*3/uL — ABNORMAL HIGH (ref 1.7–7.7)
Neutrophils Relative %: 81 % — ABNORMAL HIGH (ref 43–77)
Platelets: 162 10*3/uL (ref 150–400)
RBC: 5.12 MIL/uL (ref 4.22–5.81)
RDW: 13.2 % (ref 11.5–15.5)
WBC: 14.1 10*3/uL — ABNORMAL HIGH (ref 4.0–10.5)

## 2015-02-27 LAB — COMPREHENSIVE METABOLIC PANEL
ALT: 61 U/L (ref 17–63)
AST: 45 U/L — ABNORMAL HIGH (ref 15–41)
Albumin: 4.2 g/dL (ref 3.5–5.0)
Alkaline Phosphatase: 64 U/L (ref 38–126)
Anion gap: 12 (ref 5–15)
BUN: 19 mg/dL (ref 6–20)
CO2: 25 mmol/L (ref 22–32)
Calcium: 9 mg/dL (ref 8.9–10.3)
Chloride: 102 mmol/L (ref 101–111)
Creatinine, Ser: 0.97 mg/dL (ref 0.61–1.24)
GFR calc Af Amer: >60 mL/min (ref >60)
GFR calc non Af Amer: >60 mL/min (ref >60)
Glucose, Bld: 146 mg/dL — ABNORMAL HIGH (ref 65–99)
Potassium: 3.9 mmol/L (ref 3.5–5.1)
Sodium: 139 mmol/L (ref 135–145)
Total Bilirubin: 0.9 mg/dL (ref 0.3–1.2)
Total Protein: 7.4 g/dL (ref 6.5–8.1)

## 2015-02-27 LAB — URINE MICROSCOPIC-ADD ON

## 2015-02-27 LAB — I-STAT CG4 LACTIC ACID, ED: Lactic Acid, Venous: 1.71 mmol/L (ref 0.5–2.0)

## 2015-02-27 LAB — LIPASE, BLOOD: Lipase: 18 U/L — ABNORMAL LOW (ref 22–51)

## 2015-02-27 MED ORDER — ONDANSETRON HCL 4 MG PO TABS: 4.0000 mg | ORAL_TABLET | Freq: Four times a day (QID) | ORAL | Status: DC | PRN

## 2015-02-27 MED ORDER — ACETAMINOPHEN 650 MG RE SUPP: 650.0000 mg | Freq: Four times a day (QID) | RECTAL | Status: DC | PRN

## 2015-02-27 MED ORDER — IOHEXOL 300 MG/ML  SOLN
100.0000 mL | Freq: Once | INTRAMUSCULAR | Status: AC | PRN
Start: 2015-02-27 — End: 2015-02-27
  Administered 2015-02-27: 100 mL via INTRAVENOUS

## 2015-02-27 MED ORDER — ONDANSETRON HCL 4 MG/2ML IJ SOLN: 4.0000 mg | Freq: Three times a day (TID) | INTRAMUSCULAR | Status: DC | PRN

## 2015-02-27 MED ORDER — HYDROMORPHONE HCL 1 MG/ML IJ SOLN
0.5000 mg | INTRAMUSCULAR | Status: DC | PRN
  Administered 2015-02-27: 1 mg via INTRAVENOUS
  Filled 2015-02-27: qty 1

## 2015-02-27 MED ORDER — SODIUM CHLORIDE 0.9 % IV SOLN
1000.0000 mL | INTRAVENOUS | Status: DC
  Administered 2015-02-27: 1000 mL via INTRAVENOUS

## 2015-02-27 MED ORDER — PANTOPRAZOLE SODIUM 40 MG IV SOLR
40.0000 mg | Freq: Two times a day (BID) | INTRAVENOUS | Status: DC
  Administered 2015-02-27 – 2015-02-28 (×4): 40 mg via INTRAVENOUS
  Filled 2015-02-27 (×4): qty 40

## 2015-02-27 MED ORDER — OXYCODONE HCL 5 MG PO TABS: 5.0000 mg | ORAL_TABLET | Freq: Four times a day (QID) | ORAL | Status: DC | PRN

## 2015-02-27 MED ORDER — HYDRALAZINE HCL 20 MG/ML IJ SOLN: 5.0000 mg | Freq: Four times a day (QID) | INTRAMUSCULAR | Status: DC | PRN

## 2015-02-27 MED ORDER — ALUM & MAG HYDROXIDE-SIMETH 200-200-20 MG/5ML PO SUSP
30.0000 mL | Freq: Four times a day (QID) | ORAL | Status: DC | PRN
Start: 2015-02-27 — End: 2015-03-01

## 2015-02-27 MED ORDER — ONDANSETRON HCL 4 MG/2ML IJ SOLN
4.0000 mg | Freq: Once | INTRAMUSCULAR | Status: AC
  Administered 2015-02-27: 4 mg via INTRAVENOUS
  Filled 2015-02-27: qty 2

## 2015-02-27 MED ORDER — IOHEXOL 300 MG/ML  SOLN
25.0000 mL | Freq: Once | INTRAMUSCULAR | Status: AC | PRN
  Administered 2015-02-27: 25 mL via ORAL

## 2015-02-27 MED ORDER — ACETAMINOPHEN 325 MG PO TABS: 650.0000 mg | ORAL_TABLET | Freq: Four times a day (QID) | ORAL | Status: DC | PRN

## 2015-02-27 MED ORDER — SODIUM CHLORIDE 0.9 % IV SOLN
INTRAVENOUS | Status: DC
  Administered 2015-02-27: 08:00:00 via INTRAVENOUS

## 2015-02-27 MED ORDER — ONDANSETRON HCL 4 MG/2ML IJ SOLN
4.0000 mg | Freq: Four times a day (QID) | INTRAMUSCULAR | Status: DC | PRN
  Administered 2015-02-27: 4 mg via INTRAVENOUS
  Filled 2015-02-27: qty 2

## 2015-02-27 MED ORDER — OXYCODONE HCL 5 MG PO TABS: 5.0000 mg | ORAL_TABLET | ORAL | Status: DC | PRN

## 2015-02-27 MED ORDER — HYDROMORPHONE HCL 1 MG/ML IJ SOLN
1.0000 mg | Freq: Once | INTRAMUSCULAR | Status: AC
  Administered 2015-02-27: 1 mg via INTRAVENOUS
  Filled 2015-02-27: qty 1

## 2015-02-27 MED ORDER — HYDROMORPHONE HCL 1 MG/ML IJ SOLN: 1.0000 mg | INTRAMUSCULAR | Status: DC | PRN

## 2015-02-27 MED ORDER — PANTOPRAZOLE SODIUM 40 MG IV SOLR
40.0000 mg | Freq: Two times a day (BID) | INTRAVENOUS | Status: DC
Start: 2015-02-27 — End: 2015-02-27

## 2015-02-27 MED ORDER — SODIUM CHLORIDE 0.9 % IV SOLN
1000.0000 mL | Freq: Once | INTRAVENOUS | Status: AC
  Administered 2015-02-27: 1000 mL via INTRAVENOUS

## 2015-02-27 MED ORDER — SODIUM CHLORIDE 0.9 % IV SOLN
INTRAVENOUS | Status: DC
  Administered 2015-02-27: 14:00:00 via INTRAVENOUS

## 2015-02-27 MED ORDER — HYDROMORPHONE HCL 1 MG/ML IJ SOLN: 0.5000 mg | INTRAMUSCULAR | Status: DC | PRN

## 2015-02-27 MED ORDER — PNEUMOCOCCAL VAC POLYVALENT 25 MCG/0.5ML IJ INJ
0.5000 mL | INJECTION | INTRAMUSCULAR | Status: AC
  Administered 2015-02-28: 0.5 mL via INTRAMUSCULAR
  Filled 2015-02-27: qty 0.5

## 2015-02-27 NOTE — ED Notes (Signed)
Pt reports feeling nauseous before going to bed this evening, began dry heaving early this morning when he woke up. Pt c/o generalized abdominal pain.

## 2015-02-27 NOTE — ED Notes (Signed)
Report given to RN on 6N

## 2015-02-27 NOTE — ED Notes (Signed)
Attempt to call report to floor x1. 

## 2015-02-27 NOTE — ED Notes (Signed)
CT aware pt finished contrast 

## 2015-02-27 NOTE — Progress Notes (Signed)
PROGRESS NOTE    Keith Vance WUJ:[id number] DOB: 1961/03/02 DOA: 02/27/2015 [facility]CP: [facility]  HPI/Brief narrative 54 y.o. male with a history of a previous Umbilical Hernia Repair, HTN, and Hyperlipidemia who presents to the ED with complaints of sudden sharp 8/10 Peri-Umbilical ABD Pain and nausea and vomiting since 9 pm.He reports that he has had 2 prior episodes of SBO in the past. He was evaluated in the ED and a CT scan of the ABD was performed and revealed findings suggestive of enteritis, and Ileus vs Early SBO.     Assessment/Plan:  Enteritis with ileus Vs SBO - Gives prior h/o SBO's - last one 3 years ago- resolved with conservative Rx. - Treating supportively with bowel rest, IV fluids and pain Mx. - Improving - Continue current care for additional 24 hours and if continues to improve then advance diet as tolerated. - follow KUB in AM - If worsens or not progressing, will call surgeons - Mobilize and minimize opioids.  Essential hypertension - Continue when necessary IV hydralazine. Oral medications on hold. - Controlled  HLD - Not on treatment    DVT prophylaxis: SCDs Code Status: Full Family Communication: None at bedside Disposition Plan: DC home when medically stable   Consultants:  None  Procedures:  None  Antibiotics:  None   Subjective: Feels better. Decreased abdominal pain. No vomiting since 3 AM. Last small BM 2:30 AM. No flatus. No sickly contacts or eating anything unusual prior to symptoms.  Objective: Filed Vitals:   02/27/15 0615 02/27/15 0630 02/27/15 0802 02/27/15 0841  BP: 123/76 121/81 128/80   Pulse: 82 77 75   Temp:   98.2 F (36.8 C)   TempSrc:   Oral   Resp: 14 13 16    Height:    5\' 10"  (1.778 m)  Weight:    121.11 kg (267 lb)  SpO2: 87% 96% 96%     Intake/Output Summary (Last 24 hours) at 02/27/15 1317 Last data filed at 02/27/15 0457  Gross per 24 hour  Intake      0 ml  Output     50 ml    Net    -50 ml   Filed Weights   02/27/15 0841  Weight: 121.11 kg (267 lb)     Exam:  General exam: Pleasant middle-aged male lying comfortably in bed. Obese. Respiratory system: Clear. No increased work of breathing. Cardiovascular system: S1 & S2 heard, RRR. No JVD, murmurs, gallops, clicks or pedal edema. Gastrointestinal system: Abdomen is nondistended, soft and nontender. Diminished bowel sounds heard. Central nervous system: Alert and oriented. No focal neurological deficits. Extremities: Symmetric 5 x 5 power.   Data Reviewed: Basic Metabolic Panel:  Recent Labs Lab 02/27/15 0350  NA 139  K 3.9  CL 102  CO2 25  GLUCOSE 146*  BUN 19  CREATININE 0.97  CALCIUM 9.0   Liver Function Tests:  Recent Labs Lab 02/27/15 0350  AST 45*  ALT 61  ALKPHOS 64  BILITOT 0.9  PROT 7.4  ALBUMIN 4.2    Recent Labs Lab 02/27/15 0350  LIPASE 18*   No results for input(s): AMMONIA in the last 168 hours. CBC:  Recent Labs Lab 02/27/15 0350  WBC 14.1*  NEUTROABS 11.4*  HGB 15.9  HCT 45.9  MCV 89.6  PLT 162   Cardiac Enzymes: No results for input(s): CKTOTAL, CKMB, CKMBINDEX, TROPONINI in the last 168 hours. BNP (last 3 results) No results for input(s): PROBNP in the last  8760 hours. CBG: No results for input(s): GLUCAP in the last 168 hours.  No results found for this or any previous visit (from the past 240 hour(s)).       Studies: Ct Abdomen Pelvis W Contrast  02/27/2015   CLINICAL DATA:  Initial evaluation for acute mid abdominal pain and distension, nausea and vomiting. History of hypertension, bowel blockage is. Prior inguinal hernia and umbilical hernia repair.  EXAM: CT ABDOMEN AND PELVIS WITH CONTRAST  TECHNIQUE: Multidetector CT imaging of the abdomen and pelvis was performed using the standard protocol following bolus administration of intravenous contrast.  CONTRAST:  OMNIPAQUE IOHEXOL 300 MG/ML  SOLN  COMPARISON:  Prior CT from 09/06/2010.   FINDINGS: Mild atelectatic changes present within the partially visualized left lower lobe. The visualized lungs are otherwise clear.  13 mm cyst present within the right hepatic lobe. Additional tiny subcentimeter hypodensity noted near the hepatic dome noted, too small the characterize, but may reflect an additional small cyst. Liver is otherwise unremarkable. Gallbladder within normal limits. No biliary dilatation. Spleen, adrenal glands, and pancreas demonstrate a normal contrast enhanced appearance. Few scattered small nonobstructive calculi present within the left kidney, the largest of which measures 3 mm in the lower pole. No hydronephrosis. No focal enhancing renal mass. No right-sided renal calculi.  Stomach within normal limits. There are multiple mildly prominent fluid-filled loops of bowel clustered within the lower abdomen. These loops of bowel measure up to 3.2 cm in diameter. A few air-fluid levels are present most of these dilated loops of bowel are proximal to a single loop located within the right lower quadrant was demonstrates prominent wall thickening (series 2, image 81). No definite discrete transition point identified, although the terminal ileum is decompressed. Findings are favored to reflect acute enteritis with associated ileus the appendix is well visualized in the Roux right lower quadrant and is unremarkable without evidence for acute appendicitis. Colon is decompressed without acute inflammatory changes.  Bladder within normal limits.  Prostate normal.  No free air or fluid. No pathologically enlarged intra-abdominal or pelvic lymph nodes identified. Normal intravascular enhancement seen within the intra-abdominal aorta and its branch vessels. Mild plaque present within the infrarenal aorta without aneurysm.  No acute osseous abnormality. No worrisome lytic or blastic osseous lesions.  IMPRESSION: 1. Multiple prominent fluid-filled loops of small bowel with associated wall  thickening clustered within the lower abdomen. Findings are felt to most likely reflect acute enteritis with associated ileus. While possible partial or early small bowel obstruction is not entirely excluded, this is felt to be less likely. If there is high clinical suspicion for developing obstructive process, short interval follow-up scan would likely be helpful to evaluate for interval change. 2. No other acute intra-abdominal or pelvic process. 3. Nonobstructive left renal nephrolithiasis measuring up to 3 mm.   Electronically Signed   By: Rise Mu M.D.   On: 02/27/2015 05:10        Scheduled Meds: . sodium chloride   Intravenous STAT  . pantoprazole (PROTONIX) IV  40 mg Intravenous Q12H  . [START ON 02/28/2015] pneumococcal 23 valent vaccine  0.5 mL Intramuscular Tomorrow-1000   Continuous Infusions: . sodium chloride 1,000 mL (02/27/15 0631)  . sodium chloride      Principal Problem:   SBO (small bowel obstruction) Active Problems:   Enteritis   Abdominal pain, periumbilical   Nausea and vomiting   Hypertension   Hyperlipidemia   Leukocytosis    Time spent: 25 minutes.  Marcellus ScottHONGALGI,Bronte Sabado, MD, FACP, FHM. [facility] Pager [phone]  If 7PM-7AM, please contact night-coverage [email or web address] Password [email or web address] 02/27/2015, 1:17 PM    LOS: 0 days

## 2015-02-27 NOTE — ED Notes (Signed)
Attempt to call report to floor x2. 

## 2015-02-27 NOTE — ED Notes (Signed)
Pt returned from CT °

## 2015-02-27 NOTE — H&P (Signed)
[facility] Admission History and Physical       BURRIS MATHERNE ZOX:[id number] DOB: [birth year] DOA: 02/27/2015  Referring physician: EDP PCP: [facility]  Specialists:   Chief Complaint: ABD Pain Nausea and Vomiting  HPI: Keith Vance is a 54 y.o. male with a history of a previous Umbilical Hernia Repair, HTN, and Hyperlipidemia who presents to the ED with complaints of sudden sharp 8/10 Peri-Umbilical ABD Pain and nausea and vomiting since 9 pm.   He denies any hematemesis.  He denies any fevers or chills or diarrhea or constipation.  His last BM was in the AM.  He reprts that he has had 2 prior episodes of SBO in the past.    He was evaluated in the ED and a CT scan of the ABD was performed and revealed findings suggestive of enteritis, and Ileus vs Early SBO.       Review of Systems:  Constitutional: No Weight Loss, No Weight Gain, Night Sweats, Fevers, Chills, Dizziness, Light Headedness, Fatigue, or Generalized Weakness HEENT: No Headaches, Difficulty Swallowing,Tooth/Dental Problems,Sore Throat,  No Sneezing, Rhinitis, Ear Ache, Nasal Congestion, or Post Nasal Drip,  Cardio-vascular:  No Chest pain, Orthopnea, PND, Edema in Lower Extremities, Anasarca, Dizziness, Palpitations  Resp: No Dyspnea, No DOE, No Productive Cough, No Non-Productive Cough, No Hemoptysis, No Wheezing.    GI: No Heartburn, Indigestion, +Abdominal Pain, +Nausea, +Vomiting, Diarrhea, Constipation, Hematemesis, Hematochezia, Melena, Change in Bowel Habits,  Loss of Appetite  GU: No Dysuria, No Change in Color of Urine, No Urgency or Urinary Frequency, No Flank pain.  Musculoskeletal: No Joint Pain or Swelling, No Decreased Range of Motion, No Back Pain.  Neurologic: No Syncope, No Seizures, Muscle Weakness, Paresthesia, Vision Disturbance or Loss, No Diplopia, No Vertigo, No Difficulty Walking,  Skin: No Rash or Lesions. Psych: No Change in Mood or Affect, No Depression or Anxiety, No Memory  loss, No Confusion, or Hallucinations   Past Medical History  Diagnosis Date  . Hypertension   . Hyperlipidemia      No past surgical history on file.    Prior to Admission medications   Medication Sig Start Date End Date Taking? Authorizing Provider  amLODipine (NORVASC) 10 MG tablet Take 10 mg by mouth daily. 02/06/15  Yes Historical Provider, MD  lisinopril-hydrochlorothiazide (PRINZIDE,ZESTORETIC) 20-25 MG per tablet Take 1 tablet by mouth daily.   Yes Historical Provider, MD  diphenhydrAMINE (BENADRYL) 25 mg capsule Take 1 capsule (25 mg total) by mouth every 6 (six) hours as needed for itching. Patient not taking: Reported on 02/27/2015 02/12/12 02/22/12  Loren Racer, MD  famotidine (PEPCID) 20 MG tablet Take 1 tablet (20 mg total) by mouth 2 (two) times daily. Patient not taking: Reported on 02/27/2015 02/12/12 02/11/13  Loren Racer, MD  predniSONE (DELTASONE) 10 MG tablet Take 2 tablets (20 mg total) by mouth daily. Patient not taking: Reported on 02/27/2015 02/12/12   Loren Racer, MD     No Known Allergies    Social History:  reports that he has never smoked. He has never used smokeless tobacco. He reports that he does not drink alcohol or use illicit drugs.     No family history on file.     Physical Exam:  GEN:  Pleasant Obese 54 y.o. Caucasian male examined and in no acute distress; cooperative with exam Filed Vitals:   02/27/15 0338 02/27/15 0415 02/27/15 0454  BP: 129/84 102/63 129/80  Pulse: 82 82 81  Temp: 97.9 F (36.6 C)  TempSrc: Oral    Resp: 15 15 18   SpO2: 93% 94% 94%   Blood pressure 129/80, pulse 81, temperature 97.9 F (36.6 C), temperature source Oral, resp. rate 18, SpO2 94 %. PSYCH: He is alert and oriented x4; does not appear anxious does not appear depressed; affect is normal HEENT: Normocephalic and Atraumatic, Mucous membranes pink; PERRLA; EOM intact; Fundi:  Benign;  No scleral icterus, Nares: Patent, Oropharynx: Clear, Fair  Dentition,    Neck:  FROM, No Cervical Lymphadenopathy nor Thyromegaly or Carotid Bruit; No JVD; Breasts:: Not examined CHEST WALL: No tenderness CHEST: Normal respiration, clear to auscultation bilaterally HEART: Regular rate and rhythm; no murmurs rubs or gallops BACK: No kyphosis or scoliosis; No CVA tenderness ABDOMEN: Decreased Bowel Sounds, Obese, Soft Non-Tender, No Rebound or Guarding; No Masses, No Organomegaly, No Pannus; No Intertriginous candida. Rectal Exam: Not done EXTREMITIES: No  Cyanosis, Clubbing, or Edema; No Ulcerations. Genitalia: not examined PULSES: 2+ and symmetric SKIN: Normal hydration no rash or ulceration CNS:  Alert and Oriented x 4, No Focal Deficits Vascular: pulses palpable throughout    Labs on Admission:  Basic Metabolic Panel:  Recent Labs Lab 02/27/15 0350  NA 139  K 3.9  CL 102  CO2 25  GLUCOSE 146*  BUN 19  CREATININE 0.97  CALCIUM 9.0   Liver Function Tests:  Recent Labs Lab 02/27/15 0350  AST 45*  ALT 61  ALKPHOS 64  BILITOT 0.9  PROT 7.4  ALBUMIN 4.2    Recent Labs Lab 02/27/15 0350  LIPASE 18*   No results for input(s): AMMONIA in the last 168 hours. CBC:  Recent Labs Lab 02/27/15 0350  WBC 14.1*  NEUTROABS 11.4*  HGB 15.9  HCT 45.9  MCV 89.6  PLT 162   Cardiac Enzymes: No results for input(s): CKTOTAL, CKMB, CKMBINDEX, TROPONINI in the last 168 hours.  BNP (last 3 results) No results for input(s): BNP in the last 8760 hours.  ProBNP (last 3 results) No results for input(s): PROBNP in the last 8760 hours.  CBG: No results for input(s): GLUCAP in the last 168 hours.  Radiological Exams on Admission: Ct Abdomen Pelvis W Contrast  02/27/2015   CLINICAL DATA:  Initial evaluation for acute mid abdominal pain and distension, nausea and vomiting. History of hypertension, bowel blockage is. Prior inguinal hernia and umbilical hernia repair.  EXAM: CT ABDOMEN AND PELVIS WITH CONTRAST  TECHNIQUE:  Multidetector CT imaging of the abdomen and pelvis was performed using the standard protocol following bolus administration of intravenous contrast.  CONTRAST:  100mL OMNIPAQUE IOHEXOL 300 MG/ML  SOLN  COMPARISON:  Prior CT from 09/06/2010.  FINDINGS: Mild atelectatic changes present within the partially visualized left lower lobe. The visualized lungs are otherwise clear.  13 mm cyst present within the right hepatic lobe. Additional tiny subcentimeter hypodensity noted near the hepatic dome noted, too small the characterize, but may reflect an additional small cyst. Liver is otherwise unremarkable. Gallbladder within normal limits. No biliary dilatation. Spleen, adrenal glands, and pancreas demonstrate a normal contrast enhanced appearance. Few scattered small nonobstructive calculi present within the left kidney, the largest of which measures 3 mm in the lower pole. No hydronephrosis. No focal enhancing renal mass. No right-sided renal calculi.  Stomach within normal limits. There are multiple mildly prominent fluid-filled loops of bowel clustered within the lower abdomen. These loops of bowel measure up to 3.2 cm in diameter. A few air-fluid levels are present most of these dilated loops of bowel are  proximal to a single loop located within the right lower quadrant was demonstrates prominent wall thickening (series 2, image 81). No definite discrete transition point identified, although the terminal ileum is decompressed. Findings are favored to reflect acute enteritis with associated ileus the appendix is well visualized in the Roux right lower quadrant and is unremarkable without evidence for acute appendicitis. Colon is decompressed without acute inflammatory changes.  Bladder within normal limits.  Prostate normal.  No free air or fluid. No pathologically enlarged intra-abdominal or pelvic lymph nodes identified. Normal intravascular enhancement seen within the intra-abdominal aorta and its branch vessels.  Mild plaque present within the infrarenal aorta without aneurysm.  No acute osseous abnormality. No worrisome lytic or blastic osseous lesions.  IMPRESSION: 1. Multiple prominent fluid-filled loops of small bowel with associated wall thickening clustered within the lower abdomen. Findings are felt to most likely reflect acute enteritis with associated ileus. While possible partial or early small bowel obstruction is not entirely excluded, this is felt to be less likely. If there is high clinical suspicion for developing obstructive process, short interval follow-up scan would likely be helpful to evaluate for interval change. 2. No other acute intra-abdominal or pelvic process. 3. Nonobstructive left renal nephrolithiasis measuring up to 3 mm.   Electronically Signed   By: Rise Mu M.D.   On: 02/27/2015 05:10       Assessment/Plan:   54 y.o. male with  Principal Problem:   1.    SBO (small bowel obstruction)/ vs  Enteritis   NPO except for ICe Chips   Anti-Emetics PRN   IV Protonix  Active Problems:   2.   Abdominal pain, periumbilical- due to #1   Pain Control        3.   Nausea and vomiting   PRN IV Zosyn     4.   Hypertension   PRN IV Hydralazine    Resume Amlodipine and Lisinopril/HCTZ when Sxs Resolve     5.   Hyperlipidemia   Not currently on Rx     6.   Leukocytosis- Stress Rxn    Monitor Trend     7.   DVT Prophylaxis   SCDs          Code Status:     FULL CODE       Family Communication:   Family at Bedside     Disposition Plan:    Inpatient  Status        Time spent:  42 Minutes      Ron Parker [facility] Pager [phone]   If 7AM -7PM Please Contact the Day Rounding Team MD for [facility]  If 7PM-7AM, Please Contact Night-Floor Coverage  [email or web address] Password [facility] 02/27/2015, 6:24 AM     ADDENDUM:   Patient was seen and examined on 02/27/2015

## 2015-02-27 NOTE — ED Provider Notes (Signed)
CSN: [id number]     Arrival date & time 02/27/15  4098 History  This chart was scribed for Dione Booze, MD by Roxy Cedar, ED Scribe. This patient was seen in room D30C/D30C and the patient's care was started at 3:32 AM.   Chief Complaint  Patient presents with  . Abdominal Pain   Patient is a 54 y.o. male presenting with abdominal pain. The history is provided by the patient. No language interpreter was used.  Abdominal Pain Associated symptoms: nausea   Associated symptoms: no constipation, no diarrhea and no vomiting    HPI Comments: Keith Vance is a 54 y.o. male with a PMHx of hypertension, hyperlipidemia and umbilical hernia, who presents to the Emergency Department complaining of moderate nausea, diaphoresis, fullness to stomach, periumbilical abdominal pain onset 1AM. He denies associated constipation or diarrhea. He states he has a hx of gas blockage in the past that presented with similar symptoms. Pt rates pain to be 8/10 and at worst, 10/10. Pt denies smoking or drinking. Pt is seen by PCP Dr. Mayford Knife.  Past Medical History  Diagnosis Date  . Hypertension   . Hyperlipidemia    No past surgical history on file. No family history on file. History  Substance Use Topics  . Smoking status: Never Smoker   . Smokeless tobacco: Never Used  . Alcohol Use: No   Review of Systems  Constitutional: Positive for diaphoresis.  Gastrointestinal: Positive for nausea and abdominal pain. Negative for vomiting, diarrhea and constipation.  All other systems reviewed and are negative.  Allergies  Review of patient's allergies indicates no known allergies.  Home Medications   Prior to Admission medications   Medication Sig Start Date End Date Taking? Authorizing Provider  diphenhydrAMINE (BENADRYL) 25 mg capsule Take 1 capsule (25 mg total) by mouth every 6 (six) hours as needed for itching. 02/12/12 02/22/12  Loren Racer, MD  famotidine (PEPCID) 20 MG tablet Take 1 tablet (20  mg total) by mouth 2 (two) times daily. 02/12/12 02/11/13  Loren Racer, MD  lisinopril-hydrochlorothiazide (PRINZIDE,ZESTORETIC) 20-25 MG per tablet Take 1 tablet by mouth daily.    Historical Provider, MD  predniSONE (DELTASONE) 10 MG tablet Take 2 tablets (20 mg total) by mouth daily. 02/12/12   Loren Racer, MD  simvastatin (ZOCOR) 20 MG tablet Take 20 mg by mouth every evening.    Historical Provider, MD   Triage Vitals: BP 129/84 mmHg  Pulse 82  Temp(Src) 97.9 F (36.6 C) (Oral)  Resp 15  SpO2 93%  Physical Exam  Constitutional: He is oriented to person, place, and time. He appears well-developed and well-nourished. No distress.  Appears uncomfortable.  HENT:  Head: Normocephalic and atraumatic.  Eyes: Conjunctivae and EOM are normal. Pupils are equal, round, and reactive to light.  Neck: Normal range of motion. Neck supple. No JVD present.  Cardiovascular: Normal rate, regular rhythm and normal heart sounds.   No murmur heard. Pulmonary/Chest: Effort normal and breath sounds normal. He has no wheezes. He has no rales. He exhibits no tenderness.  Abdominal: Soft. Bowel sounds are normal. He exhibits no distension and no mass. There is tenderness.  Moderate periumbilical tenderness. No rebound or guarding. Decreased bowel sounds.  Musculoskeletal: Normal range of motion. He exhibits no edema.  Lymphadenopathy:    He has no cervical adenopathy.  Neurological: He is alert and oriented to person, place, and time. No cranial nerve deficit. He exhibits normal muscle tone. Coordination normal.  Skin: Skin is warm and dry.  No rash noted.  Psychiatric: He has a normal mood and affect. His behavior is normal. Judgment and thought content normal.  Nursing note and vitals reviewed.  ED Course  Procedures (including critical care time)  DIAGNOSTIC STUDIES: Oxygen Saturation is 93% on RA, low by my interpretation.    COORDINATION OF CARE: 3:36 AM- Discussed plans to order diagnostic  CT imaging of abdomen, EKG and lab work. Will give Zofran, Dilaudid and IV fluids. Pt advised of plan for treatment and pt agrees.  Labs Review Results for orders placed or performed during the hospital encounter of 02/27/15  Comprehensive metabolic panel  Result Value Ref Range   Sodium 139 135 - 145 mmol/L   Potassium 3.9 3.5 - 5.1 mmol/L   Chloride 102 101 - 111 mmol/L   CO2 25 22 - 32 mmol/L   Glucose, Bld 146 (H) 65 - 99 mg/dL   BUN 19 6 - 20 mg/dL   Creatinine, Ser 4.090.97 0.61 - 1.24 mg/dL   Calcium 9.0 8.9 - 81.110.3 mg/dL   Total Protein 7.4 6.5 - 8.1 g/dL   Albumin 4.2 3.5 - 5.0 g/dL   AST 45 (H) 15 - 41 U/L   ALT 61 17 - 63 U/L   Alkaline Phosphatase 64 38 - 126 U/L   Total Bilirubin 0.9 0.3 - 1.2 mg/dL   GFR calc non Af Amer >60 >60 mL/min   GFR calc Af Amer >60 >60 mL/min   Anion gap 12 5 - 15  Lipase, blood  Result Value Ref Range   Lipase 18 (L) 22 - 51 U/L  CBC with Differential  Result Value Ref Range   WBC 14.1 (H) 4.0 - 10.5 K/uL   RBC 5.12 4.22 - 5.81 MIL/uL   Hemoglobin 15.9 13.0 - 17.0 g/dL   HCT 91.445.9 78.239.0 - 95.652.0 %   MCV 89.6 78.0 - 100.0 fL   MCH 31.1 26.0 - 34.0 pg   MCHC 34.6 30.0 - 36.0 g/dL   RDW 21.313.2 08.611.5 - 57.815.5 %   Platelets 162 150 - 400 K/uL   Neutrophils Relative % 81 (H) 43 - 77 %   Neutro Abs 11.4 (H) 1.7 - 7.7 K/uL   Lymphocytes Relative 12 12 - 46 %   Lymphs Abs 1.6 0.7 - 4.0 K/uL   Monocytes Relative 6 3 - 12 %   Monocytes Absolute 0.9 0.1 - 1.0 K/uL   Eosinophils Relative 1 0 - 5 %   Eosinophils Absolute 0.1 0.0 - 0.7 K/uL   Basophils Relative 0 0 - 1 %   Basophils Absolute 0.0 0.0 - 0.1 K/uL  Urinalysis, Routine w reflex microscopic (not [facility])  Result Value Ref Range   Color, Urine YELLOW YELLOW   APPearance CLOUDY (A) CLEAR   Specific Gravity, Urine 1.025 1.005 - 1.030   pH 5.0 5.0 - 8.0   Glucose, UA NEGATIVE NEGATIVE mg/dL   Hgb urine dipstick TRACE (A) NEGATIVE   Bilirubin Urine NEGATIVE NEGATIVE   Ketones, ur NEGATIVE  NEGATIVE mg/dL   Protein, ur NEGATIVE NEGATIVE mg/dL   Urobilinogen, UA 0.2 0.0 - 1.0 mg/dL   Nitrite NEGATIVE NEGATIVE   Leukocytes, UA NEGATIVE NEGATIVE  Urine microscopic-add on  Result Value Ref Range   Squamous Epithelial / LPF RARE RARE   WBC, UA 0-2 <3 WBC/hpf   RBC / HPF 0-2 <3 RBC/hpf   Bacteria, UA RARE RARE   Casts HYALINE CASTS (A) NEGATIVE   Urine-Other MUCOUS PRESENT   I-Stat  CG4 Lactic Acid, ED  Result Value Ref Range   Lactic Acid, Venous 1.71 0.5 - 2.0 mmol/L   Imaging Review Ct Abdomen Pelvis W Contrast  02/27/2015   CLINICAL DATA:  Initial evaluation for acute mid abdominal pain and distension, nausea and vomiting. History of hypertension, bowel blockage is. Prior inguinal hernia and umbilical hernia repair.  EXAM: CT ABDOMEN AND PELVIS WITH CONTRAST  TECHNIQUE: Multidetector CT imaging of the abdomen and pelvis was performed using the standard protocol following bolus administration of intravenous contrast.  CONTRAST:  OMNIPAQUE IOHEXOL 300 MG/ML  SOLN  COMPARISON:  Prior CT from 09/06/2010.  FINDINGS: Mild atelectatic changes present within the partially visualized left lower lobe. The visualized lungs are otherwise clear.  13 mm cyst present within the right hepatic lobe. Additional tiny subcentimeter hypodensity noted near the hepatic dome noted, too small the characterize, but may reflect an additional small cyst. Liver is otherwise unremarkable. Gallbladder within normal limits. No biliary dilatation. Spleen, adrenal glands, and pancreas demonstrate a normal contrast enhanced appearance. Few scattered small nonobstructive calculi present within the left kidney, the largest of which measures 3 mm in the lower pole. No hydronephrosis. No focal enhancing renal mass. No right-sided renal calculi.  Stomach within normal limits. There are multiple mildly prominent fluid-filled loops of bowel clustered within the lower abdomen. These loops of bowel measure up to 3.2 cm in  diameter. A few air-fluid levels are present most of these dilated loops of bowel are proximal to a single loop located within the right lower quadrant was demonstrates prominent wall thickening (series 2, image 81). No definite discrete transition point identified, although the terminal ileum is decompressed. Findings are favored to reflect acute enteritis with associated ileus the appendix is well visualized in the Roux right lower quadrant and is unremarkable without evidence for acute appendicitis. Colon is decompressed without acute inflammatory changes.  Bladder within normal limits.  Prostate normal.  No free air or fluid. No pathologically enlarged intra-abdominal or pelvic lymph nodes identified. Normal intravascular enhancement seen within the intra-abdominal aorta and its branch vessels. Mild plaque present within the infrarenal aorta without aneurysm.  No acute osseous abnormality. No worrisome lytic or blastic osseous lesions.  IMPRESSION: 1. Multiple prominent fluid-filled loops of small bowel with associated wall thickening clustered within the lower abdomen. Findings are felt to most likely reflect acute enteritis with associated ileus. While possible partial or early small bowel obstruction is not entirely excluded, this is felt to be less likely. If there is high clinical suspicion for developing obstructive process, short interval follow-up scan would likely be helpful to evaluate for interval change. 2. No other acute intra-abdominal or pelvic process. 3. Nonobstructive left renal nephrolithiasis measuring up to 3 mm.   Electronically Signed   By: Rise Mu M.D.   On: 02/27/2015 05:10   Images viewed by me.   EKG Interpretation   Date/Time:  Saturday February 27 2015 03:32:37 EDT Ventricular Rate:  88 PR Interval:  150 QRS Duration: 108 QT Interval:  378 QTC Calculation: 457 R Axis:   -63 Text Interpretation:  Sinus rhythm LAD, consider left anterior fascicular  block  Baseline wander in lead(s) V2 When compared with ECG of 01/05/2010,  No significant change was found Confirmed by [facility]  MD, Artesia Berkey ([id number]) on  02/27/2015 3:38:00 AM     MDM   Final diagnoses:  Abdominal pain, unspecified abdominal location  SBO (small bowel obstruction)    Abdominal pain and nausea and  vomiting strongly suspicious for small bowel obstruction. He has had surgery on umbilical hernia and inguinal hernia and has history of prior obstruction. He is given IV fluids, hydromorphone for pain, ondansetron for nausea and is sent for CT scan. CT scan shows dilated small bowel loops which are consistent with a small bowel obstruction. Radiologist is not convinced but I do believe that the clinical picture and x-ray are consistent with small bowel obstruction. Case is discussed with Dr. Lovell Sheehan [facility] who agrees to admit the patient.  I personally performed the services described in this documentation, which was scribed in my presence. The recorded information has been reviewed and is accurate.    Dione Booze, MD 02/27/15 [phone]

## 2015-02-27 NOTE — Progress Notes (Signed)
Utilization review completed.  

## 2015-02-28 ENCOUNTER — Inpatient Hospital Stay (HOSPITAL_COMMUNITY): Payer: No Typology Code available for payment source

## 2015-02-28 DIAGNOSIS — E785 Hyperlipidemia, unspecified: Secondary | ICD-10-CM

## 2015-02-28 DIAGNOSIS — I10 Essential (primary) hypertension: Secondary | ICD-10-CM

## 2015-02-28 LAB — CBC
HCT: 40.8 % (ref 39.0–52.0)
Hemoglobin: 13.7 g/dL (ref 13.0–17.0)
MCH: 30.5 pg (ref 26.0–34.0)
MCHC: 33.6 g/dL (ref 30.0–36.0)
MCV: 90.9 fL (ref 78.0–100.0)
Platelets: 126 10*3/uL — ABNORMAL LOW (ref 150–400)
RBC: 4.49 MIL/uL (ref 4.22–5.81)
RDW: 13.3 % (ref 11.5–15.5)
WBC: 5 10*3/uL (ref 4.0–10.5)

## 2015-02-28 MED ORDER — HYDROCHLOROTHIAZIDE 25 MG PO TABS
25.0000 mg | ORAL_TABLET | Freq: Every day | ORAL | Status: DC
  Administered 2015-02-28 – 2015-03-01 (×2): 25 mg via ORAL
  Filled 2015-02-28 (×2): qty 1

## 2015-02-28 MED ORDER — LISINOPRIL-HYDROCHLOROTHIAZIDE 20-25 MG PO TABS: 1.0000 | ORAL_TABLET | Freq: Every day | ORAL | Status: DC

## 2015-02-28 MED ORDER — AMLODIPINE BESYLATE 10 MG PO TABS
10.0000 mg | ORAL_TABLET | Freq: Every day | ORAL | Status: DC
  Administered 2015-02-28 – 2015-03-01 (×2): 10 mg via ORAL
  Filled 2015-02-28 (×2): qty 1

## 2015-02-28 MED ORDER — LISINOPRIL 20 MG PO TABS
20.0000 mg | ORAL_TABLET | Freq: Every day | ORAL | Status: DC
  Administered 2015-02-28 – 2015-03-01 (×2): 20 mg via ORAL
  Filled 2015-02-28 (×2): qty 1

## 2015-02-28 NOTE — Plan of Care (Signed)
Problem: Phase I Progression Outcomes Goal: OOB as tolerated unless otherwise ordered Outcome: Completed/Met Date Met:  02/28/15 Ambulating in hallway.

## 2015-02-28 NOTE — Progress Notes (Signed)
PROGRESS NOTE    Kandis CockingRoy A Thielman [id number] DOB: [birth year] DOA: 02/27/2015 [facility]CP: [facility]  HPI/Brief narrative 54 y.o. male with a history of a previous Umbilical Hernia Repair, HTN, and Hyperlipidemia who presents to the ED with complaints of sudden sharp 8/10 Peri-Umbilical ABD Pain and nausea and vomiting since 9 pm.He reports that he has had 2 prior episodes of SBO in the past. He was evaluated in the ED and a CT scan of the ABD was performed and revealed findings suggestive of enteritis, and Ileus vs Early SBO.     Assessment/Plan:  Enteritis with ileus Vs SBO - Gives prior h/o SBO's - last one 3 years ago- resolved with conservative Rx. - Treating supportively with bowel rest, IV fluids and pain Mx. - If worsens or not progressing, will call surgeons - Mobilize and minimize opioids. - Improved. Passing flatus. No BM yet. KUB without SBO. - Start clear liquid diet and advance diet as tolerated.  Essential hypertension - Continue when necessary IV hydralazine. Resume home meds. - Controlled  HLD - Not on treatment    DVT prophylaxis: SCDs Code Status: Full Family Communication: Discussed with patient's mother and aunt at bedside. Disposition Plan: DC home possibly 7/4.   Consultants:  None  Procedures:  None  Antibiotics:  None   Subjective: Feels much better. No abdominal pain. Flatus + + +. No BM. No nausea or vomiting.  Objective: Filed Vitals:   02/27/15 2112 02/28/15 0124 02/28/15 0517 02/28/15 0954  BP: 142/85 140/89 123/90 128/86  Pulse: 70 64 53 64  Temp: 99.2 F (37.3 C) 98.8 F (37.1 C) 98.7 F (37.1 C) 98.2 F (36.8 C)  TempSrc: Oral Oral Oral Oral  Resp: 16 18 16 18   Height:      Weight:      SpO2: 98% 96% 97% 98%    Intake/Output Summary (Last 24 hours) at 02/28/15 1334 Last data filed at 02/28/15 1304  Gross per 24 hour  Intake   1280 ml  Output    300 ml  Net    980 ml   Filed Weights   02/27/15  0841  Weight: 121.11 kg (267 lb)     Exam:  General exam: Pleasant middle-aged male sitting up comfortably in chair. Obese. Respiratory system: Clear. No increased work of breathing. Cardiovascular system: S1 & S2 heard, RRR. No JVD, murmurs, gallops, clicks or pedal edema. Gastrointestinal system: Abdomen is nondistended, soft and nontender. Normal bowel sounds heard. Central nervous system: Alert and oriented. No focal neurological deficits. Extremities: Symmetric 5 x 5 power.   Data Reviewed: Basic Metabolic Panel:  Recent Labs Lab 02/27/15 0350  NA 139  K 3.9  CL 102  CO2 25  GLUCOSE 146*  BUN 19  CREATININE 0.97  CALCIUM 9.0   Liver Function Tests:  Recent Labs Lab 02/27/15 0350  AST 45*  ALT 61  ALKPHOS 64  BILITOT 0.9  PROT 7.4  ALBUMIN 4.2    Recent Labs Lab 02/27/15 0350  LIPASE 18*   No results for input(s): AMMONIA in the last 168 hours. CBC:  Recent Labs Lab 02/27/15 0350 02/28/15 0430  WBC 14.1* 5.0  NEUTROABS 11.4*  --   HGB 15.9 13.7  HCT 45.9 40.8  MCV 89.6 90.9  PLT 162 126*   Cardiac Enzymes: No results for input(s): CKTOTAL, CKMB, CKMBINDEX, TROPONINI in the last 168 hours. BNP (last 3 results) No results for input(s): PROBNP in the last 8760 hours. CBG: No  results for input(s): GLUCAP in the last 168 hours.  No results found for this or any previous visit (from the past 240 hour(s)).       Studies: Ct Abdomen Pelvis W Contrast  02/27/2015   CLINICAL DATA:  Initial evaluation for acute mid abdominal pain and distension, nausea and vomiting. History of hypertension, bowel blockage is. Prior inguinal hernia and umbilical hernia repair.  EXAM: CT ABDOMEN AND PELVIS WITH CONTRAST  TECHNIQUE: Multidetector CT imaging of the abdomen and pelvis was performed using the standard protocol following bolus administration of intravenous contrast.  CONTRAST:  OMNIPAQUE IOHEXOL 300 MG/ML  SOLN  COMPARISON:  Prior CT from  09/06/2010.  FINDINGS: Mild atelectatic changes present within the partially visualized left lower lobe. The visualized lungs are otherwise clear.  13 mm cyst present within the right hepatic lobe. Additional tiny subcentimeter hypodensity noted near the hepatic dome noted, too small the characterize, but may reflect an additional small cyst. Liver is otherwise unremarkable. Gallbladder within normal limits. No biliary dilatation. Spleen, adrenal glands, and pancreas demonstrate a normal contrast enhanced appearance. Few scattered small nonobstructive calculi present within the left kidney, the largest of which measures 3 mm in the lower pole. No hydronephrosis. No focal enhancing renal mass. No right-sided renal calculi.  Stomach within normal limits. There are multiple mildly prominent fluid-filled loops of bowel clustered within the lower abdomen. These loops of bowel measure up to 3.2 cm in diameter. A few air-fluid levels are present most of these dilated loops of bowel are proximal to a single loop located within the right lower quadrant was demonstrates prominent wall thickening (series 2, image 81). No definite discrete transition point identified, although the terminal ileum is decompressed. Findings are favored to reflect acute enteritis with associated ileus the appendix is well visualized in the Roux right lower quadrant and is unremarkable without evidence for acute appendicitis. Colon is decompressed without acute inflammatory changes.  Bladder within normal limits.  Prostate normal.  No free air or fluid. No pathologically enlarged intra-abdominal or pelvic lymph nodes identified. Normal intravascular enhancement seen within the intra-abdominal aorta and its branch vessels. Mild plaque present within the infrarenal aorta without aneurysm.  No acute osseous abnormality. No worrisome lytic or blastic osseous lesions.  IMPRESSION: 1. Multiple prominent fluid-filled loops of small bowel with associated  wall thickening clustered within the lower abdomen. Findings are felt to most likely reflect acute enteritis with associated ileus. While possible partial or early small bowel obstruction is not entirely excluded, this is felt to be less likely. If there is high clinical suspicion for developing obstructive process, short interval follow-up scan would likely be helpful to evaluate for interval change. 2. No other acute intra-abdominal or pelvic process. 3. Nonobstructive left renal nephrolithiasis measuring up to 3 mm.   Electronically Signed   By: Rise Mu M.D.   On: 02/27/2015 05:10   Dg Abd 2 Views  02/28/2015   CLINICAL DATA:  Small bowel obstruction. Hypertension and hyperlipidemia.  EXAM: ABDOMEN - 2 VIEW  COMPARISON:  CT of 1 day prior  FINDINGS: Contrast has passed into the colon, which is normal in caliber. No small bowel distention identified. No free intraperitoneal air. Distal gas and contrast identified.  IMPRESSION: No evidence of bowel obstruction or other acute process.   Electronically Signed   By: Jeronimo Greaves M.D.   On: 02/28/2015 09:25        Scheduled Meds: . pantoprazole (PROTONIX) IV  40 mg Intravenous  Q12H   Continuous Infusions: . sodium chloride 75 mL/hr at 02/27/15 1338    Principal Problem:   SBO (small bowel obstruction) Active Problems:   Enteritis   Abdominal pain, periumbilical   Nausea and vomiting   Hypertension   Hyperlipidemia   Leukocytosis    Time spent: 25 minutes.    Marcellus Scott, MD, FACP, FHM. [facility] Pager [phone]  If 7PM-7AM, please contact night-coverage [email or web address] Password [email or web address] 02/28/2015, 1:34 PM    LOS: 1 day

## 2015-02-28 NOTE — Progress Notes (Signed)
Patient's family brought his CPAP machine from home last night for patient's use. Please do not charge him for a hospital CPAP. Thank you.

## 2015-03-01 LAB — CBC
HCT: 42.8 % (ref 39.0–52.0)
Hemoglobin: 14.6 g/dL (ref 13.0–17.0)
MCH: 31.1 pg (ref 26.0–34.0)
MCHC: 34.1 g/dL (ref 30.0–36.0)
MCV: 91.1 fL (ref 78.0–100.0)
Platelets: 151 10*3/uL (ref 150–400)
RBC: 4.7 MIL/uL (ref 4.22–5.81)
RDW: 13 % (ref 11.5–15.5)
WBC: 7.8 10*3/uL (ref 4.0–10.5)

## 2015-03-01 MED ORDER — PANTOPRAZOLE SODIUM 40 MG PO TBEC
40.0000 mg | DELAYED_RELEASE_TABLET | Freq: Two times a day (BID) | ORAL | Status: DC
  Administered 2015-03-01: 40 mg via ORAL
  Filled 2015-03-01: qty 1

## 2015-03-01 NOTE — Discharge Instructions (Signed)
Small Bowel Obstruction °A small bowel obstruction is a blockage (obstruction) of the small intestine (small bowel). The small bowel is a long, slender tube that connects the stomach to the colon. Its job is to absorb nutrients from the fluids and foods you consume into the bloodstream.  °CAUSES  °There are many causes of intestinal blockage. The most common ones include: °· Hernias. This is a more common cause in children than adults. °· Inflammatory bowel disease (enteritis and colitis). °· Twisting of the bowel (volvulus). °· Tumors. °· Scar tissue (adhesions) from previous surgery or radiation treatment. °· Recent surgery. This may cause an acute small bowel obstruction called an ileus. °SYMPTOMS  °· Abdominal pain. This may be dull cramps or sharp pain. It may occur in one area or may be present in the entire abdomen. Pain can range from mild to severe, depending on the degree of obstruction. °· Nausea and vomiting. Vomit may be greenish or yellow bile color. °· Distended or swollen stomach. Abdominal bloating is a common symptom. °· Constipation. °· Lack of passing gas. °· Frequent belching. °· Diarrhea. This may occur if runny stool is able to leak around the obstruction. °DIAGNOSIS  °Your caregiver can usually diagnose small bowel obstruction by taking a history, doing a physical exam, and taking X-rays. If the cause is unclear, a CT scan (computerized tomography) of your abdomen and pelvis may be needed. °TREATMENT  °Treatment of the blockage depends on the cause and how bad the problem is.  °· Sometimes, the obstruction improves with bed rest and intravenous (IV) fluids. °· Resting the bowel is very important. This means following a simple diet. Sometimes, a clear liquid diet may be required for several days. °· Sometimes, a small tube (nasogastric tube) is placed into the stomach to decompress the bowel. When the bowel is blocked, it usually swells up like a balloon filled with air and fluids.  Decompression means that the air and fluids are removed by suction through that tube. This can help with pain, discomfort, and nausea. It can also help the obstruction resolve faster. °· Surgery may be required if other treatments do not work. Bowel obstruction from a hernia may require early surgery and can be an emergency procedure. Adhesions that cause frequent or severe obstructions may also require surgery. °HOME CARE INSTRUCTIONS °If your bowel obstruction is only partial or incomplete, you may be allowed to go home. °· Get plenty of rest. °· Follow your diet as directed by your caregiver. °· Only consume clear liquids until your condition improves. °· Avoid solid foods as instructed. °SEEK IMMEDIATE MEDICAL CARE IF: °· You have increased pain or cramping. °· You vomit blood. °· You have uncontrolled vomiting or nausea. °· You cannot drink fluids due to vomiting or pain. °· You develop confusion. °· You begin feeling very dry or thirsty (dehydrated). °· You have severe bloating. °· You have chills. °· You have a fever. °· You feel extremely weak or you faint. °MAKE SURE YOU: °· Understand these instructions. °· Will watch your condition. °· Will get help right away if you are not doing well or get worse. °Document Released: 10/31/2005 Document Revised: 11/06/2011 Document Reviewed: 10/28/2010 °[employer]® Patient Information ©2015 [employer]. This information is not intended to replace advice given to you by your health care provider. Make sure you discuss any questions you have with your health care provider. ° °

## 2015-03-01 NOTE — Progress Notes (Signed)
Keith Vance Keith Vance to be D/C'd Home per MD order.  Discussed with the patient and all questions fully answered.  VSS, Skin clean, dry and intact without evidence of skin break down, no evidence of skin tears noted. IV catheter discontinued intact. Site without signs and symptoms of complications. Dressing and pressure applied.  An After Visit Summary was printed and given to the patient. Patient received prescription.  D/c education completed with patient/family including follow up instructions, medication list, d/c activities limitations if indicated, with other d/c instructions as indicated by MD - patient able to verbalize understanding, all questions fully answered.   Patient instructed to return to ED, call 911, or call MD for any changes in condition.   Patient escorted via WC, and D/C home via private auto.  [name] 03/01/2015 12:24

## 2015-03-01 NOTE — Discharge Summary (Signed)
Physician Discharge Summary  Keith CockingRoy A Alcocer ZOX:[id number] DOB: 1960/12/11 DOA: 02/27/2015  PCP: [facility]  Admit date: 02/27/2015 Discharge date: 03/01/2015  Time spent: Less than 30 minutes  Recommendations for Outpatient Follow-up:  1. [facility] clinic/PCP in 5 days.  Discharge Diagnoses:  Principal Problem:   SBO (small bowel obstruction) Active Problems:   Enteritis   Abdominal pain, periumbilical   Nausea and vomiting   Hypertension   Hyperlipidemia   Leukocytosis   Discharge Condition: Improved & Stable  Diet recommendation: Heart healthy diet.  Filed Weights   02/27/15 0841  Weight: 121.11 kg (267 lb)    History of present illness:  54 y.o. male with a history of a previous Umbilical Hernia Repair, HTN, and Hyperlipidemia who presents to the ED with complaints of sudden sharp 8/10 Peri-Umbilical ABD Pain and nausea and vomiting since 9 pm.He reports that he has had 2 prior episodes of SBO in the past. He was evaluated in the ED and a CT scan of the ABD was performed and revealed findings suggestive of enteritis, and Ileus vs Early SBO.  Hospital Course:   Enteritis with ileus Vs SBO - Gives prior h/o SBO's - last one 3 years ago- resolved with conservative Rx. - Treating supportively with bowel rest, IV fluids and pain Mx. - Mobilized and minimize opioids. - Improved. Passing flatus. No BM yet. KUB without SBO. - Start clear liquid diet and advance diet as tolerated. - Ileus resolved. Patient tolerating a regular consistency diet. Had 2 small BMs overnight. Passing flatus. Denies abdominal pain or distention.  Essential hypertension - Continue home meds. - Controlled  HLD - Not on treatment    Consultants:  None  Procedures:  None   Discharge Exam:  Complaints:  Patient tolerating regular consistency diet. Had 2 small BMs overnight. Passing flatus. Denies abdominal pain or distention.  Filed Vitals:   02/28/15 1332 02/28/15  2134 03/01/15 0058 03/01/15 0601  BP: 132/84 131/89 129/87 128/84  Pulse: 62 60 57 65  Temp: 98.8 F (37.1 C) 97.9 F (36.6 C) 98.5 F (36.9 C) 98.7 F (37.1 C)  TempSrc: Oral Oral Oral Oral  Resp: 18 18 18 18   Height:      Weight:      SpO2: 98% 96% 96% 95%     General exam: Pleasant middle-aged male sitting up comfortably in chair. Obese. Respiratory system: Clear. No increased work of breathing. Cardiovascular system: S1 & S2 heard, RRR. No JVD, murmurs, gallops, clicks or pedal edema. Gastrointestinal system: Abdomen is nondistended, soft and nontender. Normal bowel sounds heard. Central nervous system: Alert and oriented. No focal neurological deficits. Extremities: Symmetric 5 x 5 power.  Discharge Instructions      Discharge Instructions    Call MD for:  persistant nausea and vomiting    Complete by:  As directed      Call MD for:  severe uncontrolled pain    Complete by:  As directed      Diet - low sodium heart healthy    Complete by:  As directed      Increase activity slowly    Complete by:  As directed             Medication List    STOP taking these medications        diphenhydrAMINE 25 mg capsule  Commonly known as:  BENADRYL     famotidine 20 MG tablet  Commonly known as:  PEPCID  TAKE these medications        amLODipine 10 MG tablet  Commonly known as:  NORVASC  Take 10 mg by mouth daily.     lisinopril-hydrochlorothiazide 20-25 MG per tablet  Commonly known as:  PRINZIDE,ZESTORETIC  Take 1 tablet by mouth daily.       Follow-up Information    Follow up with [facility]. Schedule an appointment as soon as possible for a visit in 5 days.   Specialty:  Family Medicine   Contact information:   [address] [place] [address] [phone]        The results of significant diagnostics from this hospitalization (including imaging, microbiology, ancillary and laboratory) are listed below for reference.     Significant Diagnostic Studies: Ct Abdomen Pelvis W Contrast  02/27/2015   CLINICAL DATA:  Initial evaluation for acute mid abdominal pain and distension, nausea and vomiting. History of hypertension, bowel blockage is. Prior inguinal hernia and umbilical hernia repair.  EXAM: CT ABDOMEN AND PELVIS WITH CONTRAST  TECHNIQUE: Multidetector CT imaging of the abdomen and pelvis was performed using the standard protocol following bolus administration of intravenous contrast.  CONTRAST:  OMNIPAQUE IOHEXOL 300 MG/ML  SOLN  COMPARISON:  Prior CT from 09/06/2010.  FINDINGS: Mild atelectatic changes present within the partially visualized left lower lobe. The visualized lungs are otherwise clear.  13 mm cyst present within the right hepatic lobe. Additional tiny subcentimeter hypodensity noted near the hepatic dome noted, too small the characterize, but may reflect an additional small cyst. Liver is otherwise unremarkable. Gallbladder within normal limits. No biliary dilatation. Spleen, adrenal glands, and pancreas demonstrate a normal contrast enhanced appearance. Few scattered small nonobstructive calculi present within the left kidney, the largest of which measures 3 mm in the lower pole. No hydronephrosis. No focal enhancing renal mass. No right-sided renal calculi.  Stomach within normal limits. There are multiple mildly prominent fluid-filled loops of bowel clustered within the lower abdomen. These loops of bowel measure up to 3.2 cm in diameter. A few air-fluid levels are present most of these dilated loops of bowel are proximal to a single loop located within the right lower quadrant was demonstrates prominent wall thickening (series 2, image 81). No definite discrete transition point identified, although the terminal ileum is decompressed. Findings are favored to reflect acute enteritis with associated ileus the appendix is well visualized in the Roux right lower quadrant and is unremarkable without  evidence for acute appendicitis. Colon is decompressed without acute inflammatory changes.  Bladder within normal limits.  Prostate normal.  No free air or fluid. No pathologically enlarged intra-abdominal or pelvic lymph nodes identified. Normal intravascular enhancement seen within the intra-abdominal aorta and its branch vessels. Mild plaque present within the infrarenal aorta without aneurysm.  No acute osseous abnormality. No worrisome lytic or blastic osseous lesions.  IMPRESSION: 1. Multiple prominent fluid-filled loops of small bowel with associated wall thickening clustered within the lower abdomen. Findings are felt to most likely reflect acute enteritis with associated ileus. While possible partial or early small bowel obstruction is not entirely excluded, this is felt to be less likely. If there is high clinical suspicion for developing obstructive process, short interval follow-up scan would likely be helpful to evaluate for interval change. 2. No other acute intra-abdominal or pelvic process. 3. Nonobstructive left renal nephrolithiasis measuring up to 3 mm.   Electronically Signed   By: Rise Mu M.D.   On: 02/27/2015 05:10   Dg Abd  2 Views  02/28/2015   CLINICAL DATA:  Small bowel obstruction. Hypertension and hyperlipidemia.  EXAM: ABDOMEN - 2 VIEW  COMPARISON:  CT of 1 day prior  FINDINGS: Contrast has passed into the colon, which is normal in caliber. No small bowel distention identified. No free intraperitoneal air. Distal gas and contrast identified.  IMPRESSION: No evidence of bowel obstruction or other acute process.   Electronically Signed   By: Jeronimo Greaves M.D.   On: 02/28/2015 09:25    Microbiology: No results found for this or any previous visit (from the past 240 hour(s)).   Labs: Basic Metabolic Panel:  Recent Labs Lab 02/27/15 0350  NA 139  K 3.9  CL 102  CO2 25  GLUCOSE 146*  BUN 19  CREATININE 0.97  CALCIUM 9.0   Liver Function Tests:  Recent  Labs Lab 02/27/15 0350  AST 45*  ALT 61  ALKPHOS 64  BILITOT 0.9  PROT 7.4  ALBUMIN 4.2    Recent Labs Lab 02/27/15 0350  LIPASE 18*   No results for input(s): AMMONIA in the last 168 hours. CBC:  Recent Labs Lab 02/27/15 0350 02/28/15 0430 03/01/15 0327  WBC 14.1* 5.0 7.8  NEUTROABS 11.4*  --   --   HGB 15.9 13.7 14.6  HCT 45.9 40.8 42.8  MCV 89.6 90.9 91.1  PLT 162 126* 151   Cardiac Enzymes: No results for input(s): CKTOTAL, CKMB, CKMBINDEX, TROPONINI in the last 168 hours. BNP: BNP (last 3 results) No results for input(s): BNP in the last 8760 hours.  ProBNP (last 3 results) No results for input(s): PROBNP in the last 8760 hours.  CBG: No results for input(s): GLUCAP in the last 168 hours.      Signed:  Marcellus Scott, MD, FACP, FHM. [facility] Pager [phone]  If 7PM-7AM, please contact night-coverage [email or web address] Password [email or web address] 03/01/2015, 11:50 AM

## 2015-08-25 ENCOUNTER — Emergency Department (HOSPITAL_COMMUNITY): Payer: No Typology Code available for payment source

## 2015-08-25 ENCOUNTER — Encounter (HOSPITAL_COMMUNITY): Payer: Self-pay | Admitting: Emergency Medicine

## 2015-08-25 ENCOUNTER — Inpatient Hospital Stay (HOSPITAL_COMMUNITY): Payer: No Typology Code available for payment source

## 2015-08-25 ENCOUNTER — Inpatient Hospital Stay (HOSPITAL_COMMUNITY)
Admission: EM | Admit: 2015-08-25 | Discharge: 2015-08-26 | DRG: 390 | Disposition: A | Discharge status: Home / Self Care | Payer: No Typology Code available for payment source | Attending: General Surgery | Admitting: General Surgery

## 2015-08-25 DIAGNOSIS — E785 Hyperlipidemia, unspecified: Secondary | ICD-10-CM | POA: Diagnosis present

## 2015-08-25 DIAGNOSIS — Z8719 Personal history of other diseases of the digestive system: Secondary | ICD-10-CM | POA: Diagnosis present

## 2015-08-25 DIAGNOSIS — K56609 Unspecified intestinal obstruction, unspecified as to partial versus complete obstruction: Secondary | ICD-10-CM

## 2015-08-25 DIAGNOSIS — K5660 Unspecified intestinal obstruction: Principal | ICD-10-CM | POA: Diagnosis present

## 2015-08-25 DIAGNOSIS — K5669 Other intestinal obstruction: Secondary | ICD-10-CM | POA: Diagnosis present

## 2015-08-25 DIAGNOSIS — I10 Essential (primary) hypertension: Secondary | ICD-10-CM | POA: Diagnosis present

## 2015-08-25 LAB — URINALYSIS, ROUTINE W REFLEX MICROSCOPIC
Bilirubin Urine: NEGATIVE
Glucose, UA: NEGATIVE mg/dL
Hgb urine dipstick: NEGATIVE
Ketones, ur: NEGATIVE mg/dL
Leukocytes, UA: NEGATIVE
Nitrite: NEGATIVE
Protein, ur: NEGATIVE mg/dL
Specific Gravity, Urine: 1.034 — ABNORMAL HIGH (ref 1.005–1.030)
pH: 5.5 (ref 5.0–8.0)

## 2015-08-25 LAB — LIPASE, BLOOD: Lipase: 20 U/L (ref 11–51)

## 2015-08-25 LAB — COMPREHENSIVE METABOLIC PANEL
ALT: 77 U/L — ABNORMAL HIGH (ref 17–63)
AST: 45 U/L — ABNORMAL HIGH (ref 15–41)
Albumin: 3.6 g/dL (ref 3.5–5.0)
Alkaline Phosphatase: 59 U/L (ref 38–126)
Anion gap: 9 (ref 5–15)
BUN: 19 mg/dL (ref 6–20)
CO2: 27 mmol/L (ref 22–32)
Calcium: 8.7 mg/dL — ABNORMAL LOW (ref 8.9–10.3)
Chloride: 103 mmol/L (ref 101–111)
Creatinine, Ser: 0.92 mg/dL (ref 0.61–1.24)
GFR calc Af Amer: >60 mL/min (ref >60)
GFR calc non Af Amer: >60 mL/min (ref >60)
Glucose, Bld: 119 mg/dL — ABNORMAL HIGH (ref 65–99)
Potassium: 3.9 mmol/L (ref 3.5–5.1)
Sodium: 139 mmol/L (ref 135–145)
Total Bilirubin: 1.3 mg/dL — ABNORMAL HIGH (ref 0.3–1.2)
Total Protein: 6.4 g/dL — ABNORMAL LOW (ref 6.5–8.1)

## 2015-08-25 LAB — CBC WITH DIFFERENTIAL/PLATELET
Basophils Absolute: 0 10*3/uL (ref 0.0–0.1)
Basophils Relative: 0 %
Eosinophils Absolute: 0.1 10*3/uL (ref 0.0–0.7)
Eosinophils Relative: 2 %
HCT: 48.1 % (ref 39.0–52.0)
Hemoglobin: 16.5 g/dL (ref 13.0–17.0)
Lymphocytes Relative: 18 %
Lymphs Abs: 1.2 10*3/uL (ref 0.7–4.0)
MCH: 31.9 pg (ref 26.0–34.0)
MCHC: 34.3 g/dL (ref 30.0–36.0)
MCV: 93 fL (ref 78.0–100.0)
Monocytes Absolute: 0.8 10*3/uL (ref 0.1–1.0)
Monocytes Relative: 11 %
Neutro Abs: 4.7 10*3/uL (ref 1.7–7.7)
Neutrophils Relative %: 69 %
Platelets: 161 10*3/uL (ref 150–400)
RBC: 5.17 MIL/uL (ref 4.22–5.81)
RDW: 12.7 % (ref 11.5–15.5)
WBC: 6.8 10*3/uL (ref 4.0–10.5)

## 2015-08-25 LAB — LACTIC ACID, PLASMA
Lactic Acid, Venous: 1 mmol/L (ref 0.5–2.0)
Lactic Acid, Venous: 0.9 mmol/L (ref 0.5–2.0)

## 2015-08-25 MED ORDER — IOHEXOL 300 MG/ML  SOLN
100.0000 mL | Freq: Once | INTRAMUSCULAR | Status: AC | PRN
  Administered 2015-08-25: 100 mL via INTRAVENOUS

## 2015-08-25 MED ORDER — DIATRIZOATE MEGLUMINE & SODIUM 66-10 % PO SOLN
90.0000 mL | Freq: Once | ORAL | Status: AC
  Administered 2015-08-25: 90 mL via NASOGASTRIC

## 2015-08-25 MED ORDER — ACETAMINOPHEN 325 MG PO TABS: 650.0000 mg | ORAL_TABLET | Freq: Four times a day (QID) | ORAL | Status: DC | PRN

## 2015-08-25 MED ORDER — SODIUM CHLORIDE 0.9 % IV SOLN
INTRAVENOUS | Status: DC
  Administered 2015-08-25: 12:00:00 via INTRAVENOUS

## 2015-08-25 MED ORDER — PANTOPRAZOLE SODIUM 40 MG IV SOLR
40.0000 mg | Freq: Every day | INTRAVENOUS | Status: DC
  Administered 2015-08-25: 40 mg via INTRAVENOUS
  Filled 2015-08-25: qty 40

## 2015-08-25 MED ORDER — HYDROMORPHONE HCL 1 MG/ML IJ SOLN
1.0000 mg | INTRAMUSCULAR | Status: DC | PRN
  Administered 2015-08-25 – 2015-08-26 (×5): 1 mg via INTRAVENOUS
  Filled 2015-08-25 (×5): qty 1

## 2015-08-25 MED ORDER — ENOXAPARIN SODIUM 40 MG/0.4ML ~~LOC~~ SOLN
40.0000 mg | SUBCUTANEOUS | Status: DC
  Administered 2015-08-25: 40 mg via SUBCUTANEOUS
  Filled 2015-08-25: qty 0.4

## 2015-08-25 MED ORDER — ONDANSETRON HCL 4 MG/2ML IJ SOLN
4.0000 mg | Freq: Four times a day (QID) | INTRAMUSCULAR | Status: DC | PRN
  Administered 2015-08-25 (×2): 4 mg via INTRAVENOUS
  Filled 2015-08-25 (×2): qty 2

## 2015-08-25 MED ORDER — HYDROMORPHONE HCL 1 MG/ML IJ SOLN
1.0000 mg | Freq: Once | INTRAMUSCULAR | Status: AC
  Administered 2015-08-25: 1 mg via INTRAVENOUS
  Filled 2015-08-25: qty 1

## 2015-08-25 MED ORDER — DIATRIZOATE MEGLUMINE & SODIUM 66-10 % PO SOLN
ORAL | Status: AC
  Filled 2015-08-25: qty 90

## 2015-08-25 MED ORDER — FAMOTIDINE IN NACL 20-0.9 MG/50ML-% IV SOLN
20.0000 mg | Freq: Once | INTRAVENOUS | Status: AC
  Administered 2015-08-25: 20 mg via INTRAVENOUS
  Filled 2015-08-25: qty 50

## 2015-08-25 MED ORDER — ACETAMINOPHEN 650 MG RE SUPP: 650.0000 mg | Freq: Four times a day (QID) | RECTAL | Status: DC | PRN

## 2015-08-25 MED ORDER — ONDANSETRON HCL 4 MG/2ML IJ SOLN
4.0000 mg | Freq: Once | INTRAMUSCULAR | Status: AC
  Administered 2015-08-25: 4 mg via INTRAVENOUS
  Filled 2015-08-25: qty 2

## 2015-08-25 MED ORDER — ONDANSETRON 4 MG PO TBDP: 4.0000 mg | ORAL_TABLET | Freq: Four times a day (QID) | ORAL | Status: DC | PRN

## 2015-08-25 MED ORDER — SODIUM CHLORIDE 0.9 % IV SOLN
INTRAVENOUS | Status: DC
  Administered 2015-08-25: 19:00:00 via INTRAVENOUS

## 2015-08-25 NOTE — H&P (Signed)
Keith Vance is an 54 y.o. male.   Chief Complaint: abd pain, n/v HPI: 53 yom with prior history of mesh repair of UH who has prior bowel obstruction that resolved conservatively.  He returns with no flatus in 24 hours, having some bms,some periumbilical abd pain, n/v.  He has not had a fever.   Past Medical History  Diagnosis Date  . Hypertension   . Hyperlipidemia    PSH: UH with mesh  No family history on file. Social History:  reports that he has never smoked. He has never used smokeless tobacco. He reports that he does not drink alcohol or use illicit drugs.  Allergies: No Known Allergies  Meds reviewed  Results for orders placed or performed during the hospital encounter of 08/25/15 (from the past 48 hour(s))  CBC with Differential     Status: None   Collection Time: 08/25/15 11:34 AM  Result Value Ref Range   WBC 6.8 4.0 - 10.5 K/uL   RBC 5.17 4.22 - 5.81 MIL/uL   Hemoglobin 16.5 13.0 - 17.0 g/dL   HCT 48.1 39.0 - 52.0 %   MCV 93.0 78.0 - 100.0 fL   MCH 31.9 26.0 - 34.0 pg   MCHC 34.3 30.0 - 36.0 g/dL   RDW 12.7 11.5 - 15.5 %   Platelets 161 150 - 400 K/uL   Neutrophils Relative % 69 %   Neutro Abs 4.7 1.7 - 7.7 K/uL   Lymphocytes Relative 18 %   Lymphs Abs 1.2 0.7 - 4.0 K/uL   Monocytes Relative 11 %   Monocytes Absolute 0.8 0.1 - 1.0 K/uL   Eosinophils Relative 2 %   Eosinophils Absolute 0.1 0.0 - 0.7 K/uL   Basophils Relative 0 %   Basophils Absolute 0.0 0.0 - 0.1 K/uL  Comprehensive metabolic panel     Status: Abnormal   Collection Time: 08/25/15 12:06 PM  Result Value Ref Range   Sodium 139 135 - 145 mmol/L   Potassium 3.9 3.5 - 5.1 mmol/L   Chloride 103 101 - 111 mmol/L   CO2 27 22 - 32 mmol/L   Glucose, Bld 119 (H) 65 - 99 mg/dL   BUN 19 6 - 20 mg/dL   Creatinine, Ser 0.92 0.61 - 1.24 mg/dL   Calcium 8.7 (L) 8.9 - 10.3 mg/dL   Total Protein 6.4 (L) 6.5 - 8.1 g/dL   Albumin 3.6 3.5 - 5.0 g/dL   AST 45 (H) 15 - 41 U/L   ALT 77 (H) 17 - 63 U/L    Alkaline Phosphatase 59 38 - 126 U/L   Total Bilirubin 1.3 (H) 0.3 - 1.2 mg/dL   GFR calc non Af Amer >60 >60 mL/min   GFR calc Af Amer >60 >60 mL/min    Comment: (NOTE) The eGFR has been calculated using the CKD EPI equation. This calculation has not been validated in all clinical situations. eGFR's persistently <60 mL/min signify possible Chronic Kidney Disease.    Anion gap 9 5 - 15  Lipase, blood     Status: None   Collection Time: 08/25/15 12:06 PM  Result Value Ref Range   Lipase 20 11 - 51 U/L  Urinalysis, Routine w reflex microscopic (not at [facility])     Status: Abnormal   Collection Time: 08/25/15  1:27 PM  Result Value Ref Range   Color, Urine YELLOW YELLOW   APPearance CLEAR CLEAR   Specific Gravity, Urine 1.034 (H) 1.005 - 1.030   pH 5.5 5.0 -  8.0   Glucose, UA NEGATIVE NEGATIVE mg/dL   Hgb urine dipstick NEGATIVE NEGATIVE   Bilirubin Urine NEGATIVE NEGATIVE   Ketones, ur NEGATIVE NEGATIVE mg/dL   Protein, ur NEGATIVE NEGATIVE mg/dL   Nitrite NEGATIVE NEGATIVE   Leukocytes, UA NEGATIVE NEGATIVE    Comment: MICROSCOPIC NOT DONE ON URINES WITH NEGATIVE PROTEIN, BLOOD, LEUKOCYTES, NITRITE, OR GLUCOSE <1000 mg/dL.  Lactic acid, plasma     Status: None   Collection Time: 08/25/15  1:37 PM  Result Value Ref Range   Lactic Acid, Venous 1.0 0.5 - 2.0 mmol/L   Ct Abdomen Pelvis W Contrast  08/25/2015  CLINICAL DATA:  Abdominal pain, nausea, vomiting. History of small bowel obstruction. Symptoms began last night. EXAM: CT ABDOMEN AND PELVIS WITH CONTRAST TECHNIQUE: Multidetector CT imaging of the abdomen and pelvis was performed using the standard protocol following bolus administration of intravenous contrast. CONTRAST:  171m OMNIPAQUE IOHEXOL 300 MG/ML  SOLN COMPARISON:  02/27/2015 FINDINGS: Linear areas of scarring at the left lung base. Heart is normal size. Right lung bases clear. No effusions. Mild diffuse fatty infiltration of the liver. Small low-density lesion in the  right hepatic lobe is stable since prior study, likely small cyst. Gallbladder, spleen, pancreas, adrenals and kidneys are unremarkable. There are dilated small bowel loops with decompressed distal small bowel loops. Findings most compatible with small bowel obstruction. Transition in the mid lying in the mid to lower pelvis without visible obstructing cause, presumably adhesions. Stomach and large bowel grossly unremarkable. No free fluid, free air or adenopathy. Aorta is normal caliber. Appendix is normal. No acute bony abnormality or focal bone lesion. IMPRESSION: Findings compatible with distal small bowel obstruction, partial, presumably related to adhesions in the mid lower pelvis. Mild fatty infiltration of the liver. Electronically Signed   By: KRolm BaptiseM.D.   On: 08/25/2015 13:28    Review of Systems  Constitutional: Negative for fever and chills.  Respiratory: Negative for shortness of breath.   Cardiovascular: Negative for chest pain.  Gastrointestinal: Positive for nausea, vomiting and abdominal pain. Negative for diarrhea and blood in stool.  Genitourinary: Negative for dysuria and urgency.    Blood pressure 128/73, pulse 84, temperature 98.3 F (36.8 C), temperature source Oral, resp. rate 18, SpO2 94 %. Physical Exam  Vitals reviewed. Constitutional: He appears well-developed and well-nourished.  HENT:  Head: Normocephalic and atraumatic.  Eyes: No scleral icterus.  Neck: Neck supple.  Cardiovascular: Normal rate, regular rhythm and normal heart sounds.   Respiratory: Effort normal and breath sounds normal. He has no wheezes. He has no rales.  GI: He exhibits distension. Bowel sounds are decreased. There is no tenderness. No hernia.     Assessment/Plan SBO  This is likely adhesion related. He does not need surgery. Ng placed. Will do sbo protocol and hopefully resolves  Broly Hatfield 08/25/2015, 3:32 PM

## 2015-08-25 NOTE — ED Notes (Signed)
Patient transported to CT 

## 2015-08-25 NOTE — ED Notes (Signed)
Patient comes from PCP states he has been coughing up blood for 2 days. Patient states it started with nausea then developed a cough. Patient alert and orinted x4.

## 2015-08-25 NOTE — ED Notes (Signed)
Pt unable to void at this time. 

## 2015-08-25 NOTE — ED Provider Notes (Signed)
CSN: [id number]     Arrival date & time 08/25/15  4098 History   First MD Initiated Contact with Patient 08/25/15 [phone]     No chief complaint on file.    (Consider location/radiation/quality/duration/timing/severity/associated sxs/prior Treatment) HPI   54 year old male with nausea, mild abdominal pain & cough. Her. Patient reports coughing approximately past 2 days. Occasionally small amount of blood comes up. Denies any chest pain. No shortness of breath. No fevers or chills. No urinary complaints. No specific respiratory complaints otherwise. Abdominal surgical history significant for hernia repair with mesh.  Past Medical History  Diagnosis Date  . Hypertension   . Hyperlipidemia    No past surgical history on file. No family history on file. Social History  Substance Use Topics  . Smoking status: Never Smoker   . Smokeless tobacco: Never Used  . Alcohol Use: No    Review of Systems  All systems reviewed and negative, other than as noted in HPI.   Allergies  Review of patient's allergies indicates no known allergies.  Home Medications   Prior to Admission medications   Medication Sig Start Date End Date Taking? Authorizing Provider  amLODipine (NORVASC) 10 MG tablet Take 10 mg by mouth daily. 02/06/15   Historical Provider, MD  lisinopril-hydrochlorothiazide (PRINZIDE,ZESTORETIC) 20-25 MG per tablet Take 1 tablet by mouth daily.    Historical Provider, MD   There were no vitals taken for this visit. Physical Exam  Constitutional: He appears well-developed and well-nourished. No distress.  HENT:  Head: Normocephalic and atraumatic.  Eyes: Conjunctivae are normal. Right eye exhibits no discharge. Left eye exhibits no discharge.  Neck: Neck supple.  Cardiovascular: Normal rate, regular rhythm and normal heart sounds.  Exam reveals no gallop and no friction rub.   No murmur heard. Pulmonary/Chest: Effort normal and breath sounds normal. No respiratory distress.   Abdominal: Soft. He exhibits no distension. There is no tenderness.  Mild distention. Soft. Minimal diffuse tenderness without rebound or guarding.  Musculoskeletal: He exhibits no edema or tenderness.  Neurological: He is alert.  Skin: Skin is warm and dry.  Psychiatric: He has a normal mood and affect. His behavior is normal. Thought content normal.  Nursing note and vitals reviewed.   ED Course  Procedures (including critical care time) Labs Review Labs Reviewed  URINALYSIS, ROUTINE W REFLEX MICROSCOPIC (NOT AT [facility]) - Abnormal; Notable for the following:    Specific Gravity, Urine 1.034 (*)    All other components within normal limits  COMPREHENSIVE METABOLIC PANEL - Abnormal; Notable for the following:    Glucose, Bld 119 (*)    Calcium 8.7 (*)    Total Protein 6.4 (*)    AST 45 (*)    ALT 77 (*)    Total Bilirubin 1.3 (*)    All other components within normal limits  CBC WITH DIFFERENTIAL/PLATELET  LIPASE, BLOOD  LACTIC ACID, PLASMA  LACTIC ACID, PLASMA    Imaging Review Ct Abdomen Pelvis W Contrast  08/25/2015  CLINICAL DATA:  Abdominal pain, nausea, vomiting. History of small bowel obstruction. Symptoms began last night. EXAM: CT ABDOMEN AND PELVIS WITH CONTRAST TECHNIQUE: Multidetector CT imaging of the abdomen and pelvis was performed using the standard protocol following bolus administration of intravenous contrast. CONTRAST:  OMNIPAQUE IOHEXOL 300 MG/ML  SOLN COMPARISON:  02/27/2015 FINDINGS: Linear areas of scarring at the left lung base. Heart is normal size. Right lung bases clear. No effusions. Mild diffuse fatty infiltration of the liver. Small low-density lesion in  the right hepatic lobe is stable since prior study, likely small cyst. Gallbladder, spleen, pancreas, adrenals and kidneys are unremarkable. There are dilated small bowel loops with decompressed distal small bowel loops. Findings most compatible with small bowel obstruction. Transition in the mid  lying in the mid to lower pelvis without visible obstructing cause, presumably adhesions. Stomach and large bowel grossly unremarkable. No free fluid, free air or adenopathy. Aorta is normal caliber. Appendix is normal. No acute bony abnormality or focal bone lesion. IMPRESSION: Findings compatible with distal small bowel obstruction, partial, presumably related to adhesions in the mid lower pelvis. Mild fatty infiltration of the liver. Electronically Signed   By: Charlett NoseKevin  Dover M.D.   On: 08/25/2015 13:28   I have personally reviewed and evaluated these images and lab results as part of my medical decision-making.   EKG Interpretation None      MDM   Final diagnoses:  SBO (small bowel obstruction) (HCC)  Small bowel obstruction (HCC)    54 year old male with abdominal pain, distention and vomiting. CT scan significant for small bowel obstruction. He has a history the same. Prior abdominal surgeries. NG tube was placed. Discussed with medicine for admission. Discussed the surgery for consultation. Unsure what to make of patient's report of some hematemesis.  He does report a nosebleed earlier. What he was vomiting may have been ingested blood or possibly a mallory weiss tear. He has not had any hematemesis while in the ED. He is not on any blood thinners. H&H is normal.     Raeford RazorStephen Rachid Parham, MD 09/08/15 [phone]

## 2015-08-25 NOTE — ED Notes (Signed)
NG placed

## 2015-08-26 ENCOUNTER — Inpatient Hospital Stay (HOSPITAL_COMMUNITY): Payer: No Typology Code available for payment source

## 2015-08-26 LAB — BASIC METABOLIC PANEL
Anion gap: 7 (ref 5–15)
BUN: 13 mg/dL (ref 6–20)
CO2: 27 mmol/L (ref 22–32)
Calcium: 8.3 mg/dL — ABNORMAL LOW (ref 8.9–10.3)
Chloride: 103 mmol/L (ref 101–111)
Creatinine, Ser: 0.77 mg/dL (ref 0.61–1.24)
GFR calc Af Amer: >60 mL/min (ref >60)
GFR calc non Af Amer: >60 mL/min (ref >60)
Glucose, Bld: 102 mg/dL — ABNORMAL HIGH (ref 65–99)
Potassium: 3.8 mmol/L (ref 3.5–5.1)
Sodium: 137 mmol/L (ref 135–145)

## 2015-08-26 LAB — CBC
HCT: 44.6 % (ref 39.0–52.0)
Hemoglobin: 15 g/dL (ref 13.0–17.0)
MCH: 31.6 pg (ref 26.0–34.0)
MCHC: 33.6 g/dL (ref 30.0–36.0)
MCV: 94.1 fL (ref 78.0–100.0)
Platelets: 130 10*3/uL — ABNORMAL LOW (ref 150–400)
RBC: 4.74 MIL/uL (ref 4.22–5.81)
RDW: 12.8 % (ref 11.5–15.5)
WBC: 5.6 10*3/uL (ref 4.0–10.5)

## 2015-08-26 NOTE — Discharge Instructions (Signed)
Small Bowel Obstruction A small bowel obstruction is a blockage in the small bowel. The small bowel, which is also called the small intestine, is a long, slender tube that connects the stomach to the colon. When a person eats and drinks, food and fluids go from the stomach to the small bowel. This is where most of the nutrients in the food and fluids are absorbed. A small bowel obstruction will prevent food and fluids from passing through the small bowel as they normally do during digestion. The small bowel can become partially or completely blocked. This can cause symptoms such as abdominal pain, vomiting, and bloating. If this condition is not treated, it can be dangerous because the small bowel could rupture. CAUSES Common causes of this condition include:  Scar tissue from previous surgery or radiation treatment.  Recent surgery. This may cause the movements of the bowel to slow down and cause food to block the intestine.  Hernias.  Inflammatory bowel disease (colitis).  Twisting of the bowel (volvulus).  Tumors.  A foreign body.  Slipping of a part of the bowel into another part (intussusception). SYMPTOMS Symptoms of this condition include:  Abdominal pain. This may be dull cramps or sharp pain. It may occur in one area, or it may be present in the entire abdomen. Pain can range from mild to severe, depending on the degree of obstruction.  Nausea and vomiting. Vomit may be greenish or a yellow bile color.  Abdominal bloating.  Constipation.  Lack of passing gas.  Frequent belching.  Diarrhea. This may occur if the obstruction is partial and runny stool is able to leak around the obstruction. DIAGNOSIS This condition may be diagnosed based on a physical exam, medical history, and X-rays of the abdomen. You may also have other tests, such as a CT scan of the abdomen and pelvis. TREATMENT Treatment for this condition depends on the cause and severity of the problem.  Treatment options may include:  Bed rest along with fluids and pain medicines that are given through an IV tube inserted into one of your veins. Sometimes, this is all that is needed for the obstruction to improve.  Following a simple diet. In some cases, a clear liquid diet may be required for several days. This allows the bowel to rest.  Placement of a small tube (nasogastric tube) into the stomach. When the bowel is blocked, it usually swells up like a balloon that is filled with air and fluids. The air and fluids may be removed by suction through the nasogastric tube. This can help with pain, discomfort, and nausea. It can also help the obstruction to clear up faster.  Surgery. This may be required if other treatments do not work. Bowel obstruction from a hernia may require early surgery and can be an emergency procedure. Surgery may also be required for scar tissue that causes frequent or severe obstructions. HOME CARE INSTRUCTIONS *        Stay on full liquids for at least 24 more hours, and then slowly into a soft diet.  Get plenty of rest.  Follow instructions from your health care provider about eating restrictions. You may need to avoid solid foods and consume only clear liquids until your condition improves.  Take over-the-counter and prescription medicines only as told by your health care provider.  Keep all follow-up visits as told by your health care provider. This is important. SEEK MEDICAL CARE IF:  You have a fever.  You have chills. SEEK IMMEDIATE  MEDICAL CARE IF:  You have increased pain or cramping.  You vomit blood.  You have uncontrolled vomiting or nausea.  You cannot drink fluids because of vomiting or pain.  You develop confusion.  You begin feeling very dry or thirsty (dehydrated).  You have severe bloating.  You feel extremely weak or you faint.   This information is not intended to replace advice given to you by your health care provider. Make  sure you discuss any questions you have with your health care provider.   Document Released: 10/31/2005 Document Revised: 05/05/2015 Document Reviewed: 10/08/2014 Elsevier Interactive Patient Education 2016 [employer] Inc[employer]  Full liquids for 24 more hours, then soft diet.  Soft-Food Meal Plan A soft-food meal plan includes foods that are safe and easy to swallow. This meal plan typically is used:  If you are having trouble chewing or swallowing foods.  As a transition meal plan after only having had liquid meals for a long period. WHAT DO I NEED TO KNOW ABOUT THE SOFT-FOOD MEAL PLAN? A soft-food meal plan includes tender foods that are soft and easy to chew and swallow. In most cases, bite-sized pieces of food are easier to swallow. A bite-sized piece is about  inch or smaller. Foods in this plan do not need to be ground or pureed. Foods that are very hard, crunchy, or sticky should be avoided. Also, breads, cereals, yogurts, and desserts with nuts, seeds, or fruits should be avoided. WHAT FOODS CAN I EAT? Grains Rice and wild rice. Moist bread, dressing, pasta, and noodles. Well-moistened dry or cooked cereals, such as farina (cooked wheat cereal), oatmeal, or grits. Biscuits, breads, muffins, pancakes, and waffles that have been well moistened. Vegetables Shredded lettuce. Cooked, tender vegetables, including potatoes without skins. Vegetable juices. Broths or creamed soups made with vegetables that are not stringy or chewy. Strained tomatoes (without seeds). Fruits Canned or well-cooked fruits. Soft (ripe), peeled fresh fruits, such as peaches, nectarines, kiwi, cantaloupe, honeydew melon, and watermelon (without seeds). Soft berries with small seeds, such as strawberries. Fruit juices (without pulp). Meats and Other Protein Sources Moist, tender, lean beef. Mutton. Lamb. Veal. Chicken. [place]. Liver. Ham. Fish without bones. Eggs. Dairy Milk, milk drinks, and cream. Plain cream cheese  and cottage cheese. Plain yogurt. Sweets/Desserts Flavored gelatin desserts. Custard. Plain ice cream, frozen yogurt, sherbet, milk shakes, and malts. Plain cakes and cookies. Plain hard candy.  Other Butter, margarine (without trans fat), and cooking oils. Mayonnaise. Cream sauces. Mild spices, salt, and sugar. Syrup, molasses, honey, and jelly. The items listed above may not be a complete list of recommended foods or beverages. Contact your dietitian for more options. WHAT FOODS ARE NOT RECOMMENDED? Grains Dry bread, toast, crackers that have not been moistened. Coarse or dry cereals, such as bran, granola, and shredded wheat. Tough or chewy crusty breads, such as [place] bread or baguettes. Vegetables Corn. Raw vegetables except shredded lettuce. Cooked vegetables that are tough or stringy. Tough, crisp, fried potatoes and potato skins. Fruits Fresh fruits with skins or seeds or both, such as apples, pears, or grapes. Stringy, high-pulp fruits, such as papaya, pineapple, coconut, or mango. Fruit leather, fruit roll-ups, and all dried fruits. Meats and Other Protein Sources Sausages and hot dogs. Meats with gristle. Fish with bones. Nuts, seeds, and chunky peanut or other nut butters. Sweets/Desserts Cakes or cookies that are very dry or chewy.  The items listed above may not be a complete list of foods and beverages to avoid. Contact your dietitian  for more information.   This information is not intended to replace advice given to you by your health care provider. Make sure you discuss any questions you have with your health care provider.   Document Released: 11/21/2007 Document Revised: 08/19/2013 Document Reviewed: 07/11/2013 Elsevier Interactive Patient Education [employer]

## 2015-08-26 NOTE — Progress Notes (Signed)
Subjective: Some ruq pain today but passing flatus  Objective: Vital signs in last 24 hours: Temp:  [98.3 F (36.8 C)-98.5 F (36.9 C)] 98.5 F (36.9 C) (12/28 2005) Pulse Rate:  [73-101] 78 (12/28 2005) Resp:  [17-18] 18 (12/28 2005) BP: (112-141)/(67-99) 135/88 mmHg (12/28 2005) SpO2:  [92 %-97 %] 93 % (12/28 2005) Weight:  [128.731 kg (283 lb 12.8 oz)] 128.731 kg (283 lb 12.8 oz) (12/28 2005) Last BM Date: 08/25/15  Intake/Output from previous day: 12/28 0701 - 12/29 0700 In: 913.3 [I.V.:913.3] Out: 2480 [Urine:1280; Emesis/NG output:1200] Intake/Output this shift: Total I/O In: -  Out: 500 [Urine:500]  GI: soft mild tender ruq   Lab Results:   Recent Labs  08/25/15 1134 08/26/15 0610  WBC 6.8 5.6  HGB 16.5 15.0  HCT 48.1 44.6  PLT 161 130*   BMET  Recent Labs  08/25/15 1206 08/26/15 0610  NA 139 137  K 3.9 3.8  CL 103 103  CO2 27 27  GLUCOSE 119* 102*  BUN 19 13  CREATININE 0.92 0.77  CALCIUM 8.7* 8.3*   PT/INR No results for input(s): LABPROT, INR in the last 72 hours. ABG No results for input(s): PHART, HCO3 in the last 72 hours.  Invalid input(s): PCO2, PO2  Studies/Results: Ct Abdomen Pelvis W Contrast  08/25/2015  CLINICAL DATA:  Abdominal pain, nausea, vomiting. History of small bowel obstruction. Symptoms began last night. EXAM: CT ABDOMEN AND PELVIS WITH CONTRAST TECHNIQUE: Multidetector CT imaging of the abdomen and pelvis was performed using the standard protocol following bolus administration of intravenous contrast. CONTRAST:  100mL OMNIPAQUE IOHEXOL 300 MG/ML  SOLN COMPARISON:  02/27/2015 FINDINGS: Linear areas of scarring at the left lung base. Heart is normal size. Right lung bases clear. No effusions. Mild diffuse fatty infiltration of the liver. Small low-density lesion in the right hepatic lobe is stable since prior study, likely small cyst. Gallbladder, spleen, pancreas, adrenals and kidneys are unremarkable. There are dilated  small bowel loops with decompressed distal small bowel loops. Findings most compatible with small bowel obstruction. Transition in the mid lying in the mid to lower pelvis without visible obstructing cause, presumably adhesions. Stomach and large bowel grossly unremarkable. No free fluid, free air or adenopathy. Aorta is normal caliber. Appendix is normal. No acute bony abnormality or focal bone lesion. IMPRESSION: Findings compatible with distal small bowel obstruction, partial, presumably related to adhesions in the mid lower pelvis. Mild fatty infiltration of the liver. Electronically Signed   By: Charlett NoseKevin  Dover M.D.   On: 08/25/2015 13:28   Dg Abd Portable 1v-small Bowel Obstruction Protocol-initial, 8 Hr Delay  08/26/2015  CLINICAL DATA:  8 hour delayed radiograph to assess ileus. Initial encounter. EXAM: PORTABLE ABDOMEN - 1 VIEW COMPARISON:  Abdominal radiograph performed 08/25/2015 FINDINGS: 8 hours following administration of contrast, contrast is now seen filling the colon. There is persistent distention of small-bowel loops to 4.3 cm in diameter. There is no evidence for bowel obstruction. Passage of contrast through the small bowel suggests against dysmotility. This may simply reflect the patient's baseline. The patient's enteric tube is seen ending overlying the body of the stomach, with the side port ending about the distal esophagus. No acute osseous abnormalities are seen. IMPRESSION: 8 hours following administration of contrast, contrast is now seen filling the colon. Persistent distention of small-bowel loops. No evidence for bowel obstruction. Distention of small bowel may simply reflect the patient's baseline, as passage of contrast into the colon suggests against dysmotility. Electronically Signed  By: Roanna Raider M.D.   On: 08/26/2015 04:59   Dg Abd Portable 1v  08/25/2015  CLINICAL DATA:  Check NG placement EXAM: PORTABLE ABDOMEN - 1 VIEW COMPARISON:  None. FINDINGS: Scattered large  and small bowel gas is noted with persistent small bowel dilatation. A nasogastric catheter is noted within the stomach. The bony structures are within normal limits. IMPRESSION: Nasogastric catheter within the stomach. Electronically Signed   By: Alcide Clever M.D.   On: 08/25/2015 15:37    Anti-infectives: Anti-infectives    None      Assessment/Plan: sbo Resolving will give fulls per protocol and reassess later for ruq pain and possible dc  [facility] 08/26/2015

## 2015-08-26 NOTE — Progress Notes (Signed)
Utilization Review Completed.Tripp Goins T12/29/2016  

## 2015-08-26 NOTE — Discharge Summary (Signed)
Physician Discharge Summary  Patient ID: Keith CockingRoy A Zenon MRN: [id number] DOB/AGE: 53-Nov-1962 53 y.o.  Admit date: 08/25/2015 Discharge date: 08/26/2015  Admission Diagnoses:  SBO s/p prior umbilical hernia repair  Discharge Diagnoses:  Same  Active Problems:   SBO (small bowel obstruction) (HCC)   PROCEDURES: None  Hospital Course: [age] yom with prior history of mesh repair of UH who has prior bowel obstruction that resolved conservatively. He returns with no flatus in 24 hours, having some bms,some periumbilical abd pain, n/v. He has not had a fever.   He was admitted from the ED.  NG was placed for decompression, he underwent SBO protocol and this went into the colon without issue.  The NG was pulled the 2nd hospital day and his diet was advanced.  He was ready to go home after lunch, on full liquids for another 24 hours, then soft diet for another week, before going to any fiber.  Follow up by PCP  Condition on D/C:  Improved    CBC Latest Ref Rng 08/26/2015 08/25/2015 03/01/2015  WBC 4.0 - 10.5 K/uL 5.6 6.8 7.8  Hemoglobin 13.0 - 17.0 g/dL 40.915.0 81.116.5 91.414.6  Hematocrit 39.0 - 52.0 % 44.6 48.1 42.8  Platelets 150 - 400 K/uL 130(L) 161 151   CMP Latest Ref Rng 08/26/2015 08/25/2015 02/27/2015  Glucose 65 - 99 mg/dL 782(N102(H) 562(Z119(H) 308(M146(H)  BUN 6 - 20 mg/dL 13 19 19   Creatinine 0.61 - 1.24 mg/dL 5.780.77 4.690.92 6.290.97  Sodium 135 - 145 mmol/L 137 139 139  Potassium 3.5 - 5.1 mmol/L 3.8 3.9 3.9  Chloride 101 - 111 mmol/L 103 103 102  CO2 22 - 32 mmol/L 27 27 25   Calcium 8.9 - 10.3 mg/dL 8.3(L) 8.7(L) 9.0  Total Protein 6.5 - 8.1 g/dL - 6.4(L) 7.4  Total Bilirubin 0.3 - 1.2 mg/dL - 1.3(H) 0.9  Alkaline Phos 38 - 126 U/L - 59 64  AST 15 - 41 U/L - 45(H) 45(H)  ALT 17 - 63 U/L - 77(H) 61   Disposition: 01-Home or Self Care     Medication List    TAKE these medications        amLODipine 10 MG tablet  Commonly known as:  NORVASC  Take 10 mg by mouth daily.     lisinopril-hydrochlorothiazide 20-25 MG tablet  Commonly known as:  PRINZIDE,ZESTORETIC  Take 1 tablet by mouth daily.           Follow-up Information    Call [facility].   Specialty:  Family Medicine   Why:  and follow up as needed.   Contact information:   [address] [place] [address] [phone]       Call Emelia LoronWAKEFIELD,MATTHEW, MD.   Specialty:  General Surgery   Why:  As needed   Contact information:   [address] [place] [address] [phone]       Signed: Sherrie GeorgeJENNINGS,Nicholl Onstott 08/26/2015, 12:33 PM

## 2015-08-26 NOTE — Progress Notes (Signed)
He is doing well, tolerated Full liquids, has had a large BM and would like to go home.  He plans to stay on liquids thru tomorrow.  I will let him go home now.

## 2015-08-26 NOTE — Progress Notes (Signed)
Pt discharged to home.  Discharge instructions explained to pt.  Pt has no questions at the time of discharge.  Pt states he has all belongings.  IV removed.  Pt ambulated off unit on his own.   

## 2015-11-11 ENCOUNTER — Emergency Department (HOSPITAL_COMMUNITY): Payer: BLUE CROSS/BLUE SHIELD

## 2015-11-11 ENCOUNTER — Encounter (HOSPITAL_COMMUNITY): Payer: Self-pay | Admitting: Emergency Medicine

## 2015-11-11 ENCOUNTER — Observation Stay (HOSPITAL_COMMUNITY): Payer: BLUE CROSS/BLUE SHIELD

## 2015-11-11 ENCOUNTER — Inpatient Hospital Stay (HOSPITAL_COMMUNITY)
Admission: EM | Admit: 2015-11-11 | Discharge: 2015-11-14 | DRG: 313 | Disposition: A | Discharge status: Home / Self Care | Payer: BLUE CROSS/BLUE SHIELD | Attending: Cardiovascular Disease | Admitting: Cardiovascular Disease

## 2015-11-11 DIAGNOSIS — Z87891 Personal history of nicotine dependence: Secondary | ICD-10-CM

## 2015-11-11 DIAGNOSIS — I10 Essential (primary) hypertension: Secondary | ICD-10-CM | POA: Diagnosis not present

## 2015-11-11 DIAGNOSIS — K5669 Other intestinal obstruction: Secondary | ICD-10-CM | POA: Diagnosis not present

## 2015-11-11 DIAGNOSIS — I959 Hypotension, unspecified: Secondary | ICD-10-CM

## 2015-11-11 DIAGNOSIS — R079 Chest pain, unspecified: Secondary | ICD-10-CM | POA: Diagnosis not present

## 2015-11-11 DIAGNOSIS — I129 Hypertensive chronic kidney disease with stage 1 through stage 4 chronic kidney disease, or unspecified chronic kidney disease: Secondary | ICD-10-CM | POA: Diagnosis present

## 2015-11-11 DIAGNOSIS — I209 Angina pectoris, unspecified: Secondary | ICD-10-CM

## 2015-11-11 DIAGNOSIS — N189 Chronic kidney disease, unspecified: Secondary | ICD-10-CM | POA: Diagnosis present

## 2015-11-11 DIAGNOSIS — E785 Hyperlipidemia, unspecified: Secondary | ICD-10-CM | POA: Diagnosis present

## 2015-11-11 DIAGNOSIS — Z6839 Body mass index (BMI) 39.0-39.9, adult: Secondary | ICD-10-CM

## 2015-11-11 DIAGNOSIS — Z8719 Personal history of other diseases of the digestive system: Secondary | ICD-10-CM | POA: Diagnosis present

## 2015-11-11 DIAGNOSIS — R0789 Other chest pain: Secondary | ICD-10-CM

## 2015-11-11 DIAGNOSIS — E872 Acidosis: Secondary | ICD-10-CM | POA: Diagnosis not present

## 2015-11-11 DIAGNOSIS — R140 Abdominal distension (gaseous): Secondary | ICD-10-CM

## 2015-11-11 DIAGNOSIS — R072 Precordial pain: Principal | ICD-10-CM | POA: Diagnosis present

## 2015-11-11 DIAGNOSIS — I712 Thoracic aortic aneurysm, without rupture: Secondary | ICD-10-CM | POA: Diagnosis not present

## 2015-11-11 DIAGNOSIS — K565 Intestinal adhesions [bands] with obstruction (postprocedural) (postinfection): Secondary | ICD-10-CM | POA: Diagnosis not present

## 2015-11-11 DIAGNOSIS — Z8249 Family history of ischemic heart disease and other diseases of the circulatory system: Secondary | ICD-10-CM

## 2015-11-11 DIAGNOSIS — E860 Dehydration: Secondary | ICD-10-CM | POA: Diagnosis not present

## 2015-11-11 DIAGNOSIS — K56609 Unspecified intestinal obstruction, unspecified as to partial versus complete obstruction: Secondary | ICD-10-CM

## 2015-11-11 DIAGNOSIS — Z79899 Other long term (current) drug therapy: Secondary | ICD-10-CM

## 2015-11-11 DIAGNOSIS — R7989 Other specified abnormal findings of blood chemistry: Secondary | ICD-10-CM

## 2015-11-11 HISTORY — DX: Sleep apnea, unspecified: G47.30

## 2015-11-11 HISTORY — DX: Aneurysm of the ascending aorta, without rupture: I71.21

## 2015-11-11 HISTORY — DX: Unspecified osteoarthritis, unspecified site: M19.90

## 2015-11-11 HISTORY — DX: Thoracic aortic aneurysm, without rupture: I71.2

## 2015-11-11 HISTORY — DX: Unspecified intestinal obstruction, unspecified as to partial versus complete obstruction: K56.609

## 2015-11-11 LAB — LIPASE, BLOOD: Lipase: 25 U/L (ref 11–51)

## 2015-11-11 LAB — BASIC METABOLIC PANEL
Anion gap: 11 (ref 5–15)
BUN: 18 mg/dL (ref 6–20)
CO2: 24 mmol/L (ref 22–32)
Calcium: 8.7 mg/dL — ABNORMAL LOW (ref 8.9–10.3)
Chloride: 102 mmol/L (ref 101–111)
Creatinine, Ser: 0.75 mg/dL (ref 0.61–1.24)
GFR calc Af Amer: >60 mL/min (ref >60)
GFR calc non Af Amer: >60 mL/min (ref >60)
Glucose, Bld: 113 mg/dL — ABNORMAL HIGH (ref 65–99)
Potassium: 3.5 mmol/L (ref 3.5–5.1)
Sodium: 137 mmol/L (ref 135–145)

## 2015-11-11 LAB — TROPONIN I
Troponin I: <0.03 ng/mL (ref <0.031)
Troponin I: <0.03 ng/mL (ref <0.031)
Troponin I: <0.03 ng/mL (ref <0.031)

## 2015-11-11 LAB — CBC
HCT: 47.2 % (ref 39.0–52.0)
HCT: 43.9 % (ref 39.0–52.0)
Hemoglobin: 16.2 g/dL (ref 13.0–17.0)
Hemoglobin: 14.7 g/dL (ref 13.0–17.0)
MCH: 31.1 pg (ref 26.0–34.0)
MCH: 30.7 pg (ref 26.0–34.0)
MCHC: 34.3 g/dL (ref 30.0–36.0)
MCHC: 33.5 g/dL (ref 30.0–36.0)
MCV: 90.6 fL (ref 78.0–100.0)
MCV: 91.6 fL (ref 78.0–100.0)
Platelets: 148 10*3/uL — ABNORMAL LOW (ref 150–400)
Platelets: 144 10*3/uL — ABNORMAL LOW (ref 150–400)
RBC: 5.21 MIL/uL (ref 4.22–5.81)
RBC: 4.79 MIL/uL (ref 4.22–5.81)
RDW: 13.4 % (ref 11.5–15.5)
RDW: 13.3 % (ref 11.5–15.5)
WBC: 7.4 10*3/uL (ref 4.0–10.5)
WBC: 6.1 10*3/uL (ref 4.0–10.5)

## 2015-11-11 LAB — CREATININE, SERUM
Creatinine, Ser: 0.79 mg/dL (ref 0.61–1.24)
GFR calc Af Amer: >60 mL/min (ref >60)
GFR calc non Af Amer: >60 mL/min (ref >60)

## 2015-11-11 LAB — HEPATIC FUNCTION PANEL
ALT: 147 U/L — ABNORMAL HIGH (ref 17–63)
AST: 66 U/L — ABNORMAL HIGH (ref 15–41)
Albumin: 3.8 g/dL (ref 3.5–5.0)
Alkaline Phosphatase: 58 U/L (ref 38–126)
Bilirubin, Direct: 0.3 mg/dL (ref 0.1–0.5)
Indirect Bilirubin: 1.1 mg/dL — ABNORMAL HIGH (ref 0.3–0.9)
Total Bilirubin: 1.4 mg/dL — ABNORMAL HIGH (ref 0.3–1.2)
Total Protein: 6.7 g/dL (ref 6.5–8.1)

## 2015-11-11 LAB — I-STAT TROPONIN, ED: Troponin i, poc: 0 ng/mL (ref 0.00–0.08)

## 2015-11-11 LAB — D-DIMER, QUANTITATIVE: D-Dimer, Quant: 2.65 ug/mL-FEU — ABNORMAL HIGH (ref 0.00–0.50)

## 2015-11-11 MED ORDER — ASPIRIN EC 81 MG PO TBEC: 81.0000 mg | DELAYED_RELEASE_TABLET | Freq: Every day | ORAL | Status: DC

## 2015-11-11 MED ORDER — LISINOPRIL-HYDROCHLOROTHIAZIDE 20-12.5 MG PO TABS: 1.0000 | ORAL_TABLET | Freq: Every day | ORAL | Status: DC

## 2015-11-11 MED ORDER — ONDANSETRON HCL 4 MG/2ML IJ SOLN
4.0000 mg | Freq: Four times a day (QID) | INTRAMUSCULAR | Status: DC | PRN
  Administered 2015-11-12 (×4): 4 mg via INTRAVENOUS
  Filled 2015-11-11 (×4): qty 2

## 2015-11-11 MED ORDER — HEPARIN SODIUM (PORCINE) 5000 UNIT/ML IJ SOLN
5000.0000 [IU] | Freq: Three times a day (TID) | INTRAMUSCULAR | Status: DC
  Administered 2015-11-11 – 2015-11-14 (×8): 5000 [IU] via SUBCUTANEOUS
  Filled 2015-11-11 (×7): qty 1

## 2015-11-11 MED ORDER — LISINOPRIL 20 MG PO TABS
20.0000 mg | ORAL_TABLET | Freq: Every day | ORAL | Status: DC
Start: 2015-11-11 — End: 2015-11-12

## 2015-11-11 MED ORDER — NITROGLYCERIN 0.4 MG SL SUBL: 0.4000 mg | SUBLINGUAL_TABLET | SUBLINGUAL | Status: DC | PRN

## 2015-11-11 MED ORDER — ASPIRIN 81 MG PO CHEW
324.0000 mg | CHEWABLE_TABLET | Freq: Once | ORAL | Status: AC
Start: 2015-11-11 — End: 2015-11-11
  Administered 2015-11-11: 324 mg via ORAL
  Filled 2015-11-11: qty 4

## 2015-11-11 MED ORDER — TRAMADOL HCL 50 MG PO TABS: 50.0000 mg | ORAL_TABLET | Freq: Four times a day (QID) | ORAL | Status: DC | PRN

## 2015-11-11 MED ORDER — ACETAMINOPHEN 325 MG PO TABS
650.0000 mg | ORAL_TABLET | ORAL | Status: DC | PRN
  Administered 2015-11-13 (×2): 650 mg via ORAL
  Filled 2015-11-11 (×2): qty 2

## 2015-11-11 MED ORDER — AMLODIPINE BESYLATE 10 MG PO TABS: 10.0000 mg | ORAL_TABLET | Freq: Every day | ORAL | Status: DC

## 2015-11-11 MED ORDER — OMEGA-3-ACID ETHYL ESTERS 1 G PO CAPS: 2.0000 g | ORAL_CAPSULE | Freq: Every day | ORAL | Status: DC

## 2015-11-11 MED ORDER — HYDROCHLOROTHIAZIDE 12.5 MG PO CAPS: 12.5000 mg | ORAL_CAPSULE | Freq: Every day | ORAL | Status: DC

## 2015-11-11 MED ORDER — IOHEXOL 350 MG/ML SOLN
100.0000 mL | Freq: Once | INTRAVENOUS | Status: AC | PRN
  Administered 2015-11-11: 100 mL via INTRAVENOUS

## 2015-11-11 NOTE — Plan of Care (Signed)
Problem: Education: Goal: Knowledge of [facility] Education information/materials will improve Outcome: Progressing Discussed lab results and CT scan of chest and Stress test

## 2015-11-11 NOTE — ED Notes (Signed)
Cardiologist at bedside.  

## 2015-11-11 NOTE — ED Notes (Signed)
Patient c/o sudden onset central CP that radiated to back and R shoulderblade accompanied by SOB and diaphoresis. Patient states the pain lasted about 20 minutes and went away. HX HTN and arthritis. A&O x 4.

## 2015-11-11 NOTE — ED Provider Notes (Signed)
CSN: [id number]     Arrival date & time 11/11/15  0509 History   First MD Initiated Contact with Patient 11/11/15 [phone]     Chief Complaint  Patient presents with  . Chest Pain     (Consider location/radiation/quality/duration/timing/severity/associated sxs/prior Treatment) HPI Patient was at work. At 4:30 AM he developed central chest pain that was an intense pressure and fullness sensation. He reports it radiated towards his right shoulder blade. He became very sweaty and short of breath. After approximately 20 minutes the symptoms resolved. He denies chest pain at this time. Patient reports he had been at work for about 30 minutes. He was not doing any exertional type work. He did not have associated abdominal pain. Patient states he did not eat anything this morning before going to work. He has a history of a couple episodes of small bowel obstruction treated conservatively. He reports this did not feel similar to that. Patient has hypertension controlled by 2 medications. He is compliant. History of hyperlipidemia that is controlled by diet and omega-3 Fish oil. Patient is a nonsmoker. Family history: Father died of cancer, mother is well without significant medical history. A sister 16 years younger without medical problems. Past Medical History  Diagnosis Date  . Hypertension   . Hyperlipidemia   . Arthritis    Past Surgical History  Procedure Laterality Date  . Hernia repair     Family History  Problem Relation Age of Onset  . Heart attack Maternal Grandmother     >49s of age  . Hypertension Father   . Diabetes Father    Social History  Substance Use Topics  . Smoking status: Former Smoker    Quit date: 11/11/1979  . Smokeless tobacco: Never Used  . Alcohol Use: No    Review of Systems 10 Systems reviewed and are negative for acute change except as noted in the HPI.    Allergies  Review of patient's allergies indicates no known allergies.  Home Medications    Prior to Admission medications   Medication Sig Start Date End Date Taking? Authorizing Provider  amLODipine (NORVASC) 10 MG tablet Take 10 mg by mouth daily. 02/06/15  Yes Historical Provider, MD  lisinopril-hydrochlorothiazide (PRINZIDE,ZESTORETIC) 20-12.5 MG tablet Take 1 tablet by mouth daily.   Yes Historical Provider, MD  omega-3 acid ethyl esters (LOVAZA) 1 g capsule Take 2 g by mouth daily.   Yes Historical Provider, MD  Red Yeast Rice Extract (RED YEAST RICE PO) Take 1 tablet by mouth daily.   Yes Historical Provider, MD  traMADol (ULTRAM) 50 MG tablet Take 50 mg by mouth every 6 (six) hours as needed for moderate pain.   Yes Historical Provider, MD   BP 98/58 mmHg  Pulse 73  Temp(Src) 98.3 F (36.8 C) (Oral)  Resp 15  Ht 5' 10.5" (1.791 m)  Wt 281 lb (127.461 kg)  BMI 39.74 kg/m2  SpO2 95% Physical Exam  Constitutional: He is oriented to person, place, and time. He appears well-developed and well-nourished.  HENT:  Head: Normocephalic and atraumatic.  Eyes: EOM are normal. Pupils are equal, round, and reactive to light.  Neck: Neck supple.  Cardiovascular: Normal rate, regular rhythm, normal heart sounds and intact distal pulses.   Pulmonary/Chest: Effort normal and breath sounds normal.  Abdominal: Soft. Bowel sounds are normal. He exhibits no distension. There is no tenderness.  Musculoskeletal: Normal range of motion. He exhibits no edema or tenderness.  Neurological: He is alert and oriented to person, place,  and time. He has normal strength. He exhibits normal muscle tone. Coordination normal. GCS eye subscore is 4. GCS verbal subscore is 5. GCS motor subscore is 6.  Skin: Skin is warm, dry and intact.  Psychiatric: He has a normal mood and affect.    ED Course  Procedures (including critical care time) Labs Review Labs Reviewed  BASIC METABOLIC PANEL - Abnormal; Notable for the following:    Glucose, Bld 113 (*)    Calcium 8.7 (*)    All other components  within normal limits  CBC - Abnormal; Notable for the following:    Platelets 148 (*)    All other components within normal limits  HEPATIC FUNCTION PANEL - Abnormal; Notable for the following:    AST 66 (*)    ALT 147 (*)    Total Bilirubin 1.4 (*)    Indirect Bilirubin 1.1 (*)    All other components within normal limits  LIPASE, BLOOD  TROPONIN I  D-DIMER, QUANTITATIVE (NOT AT [facility])  CBC  CREATININE, SERUM  TROPONIN I  TROPONIN I  TROPONIN I  I-STAT TROPOININ, ED    Imaging Review Dg Chest 2 View  11/11/2015  CLINICAL DATA:  Centralized chest pain, shortness of breath, diaphoresis tonight. History of hypertension, hyperlipidemia. EXAM: CHEST  2 VIEW COMPARISON:  None available for comparison at time of study interpretation. FINDINGS: Cardiac silhouette is mildly enlarged, mediastinal silhouette is normal. Low inspiratory examination with bibasilar atelectasis. No pleural effusion or focal consolidation. Mildly elevated RIGHT hemidiaphragm. No pneumothorax. Soft tissue planes and included osseous structures are nonsuspicious. IMPRESSION: Mild cardiomegaly and bibasilar atelectasis. Electronically Signed   By: Awilda Metroourtnay  Bloomer M.D.   On: 11/11/2015 05:57   I have personally reviewed and evaluated these images and lab results as part of my medical decision-making.   EKG Interpretation   Date/Time:  Thursday November 11 2015 05:18:08 EDT Ventricular Rate:  79 PR Interval:  157 QRS Duration: 114 QT Interval:  396 QTC Calculation: 454 R Axis:   -64 Text Interpretation:  Sinus rhythm Left anterior fascicular block No  significant change since last tracing Confirmed by LITTLE MD, RACHEL  [phone]([id number]) on 11/11/2015 5:54:41 AM     Consult: 07:55 reviewed with Rosann Auerbachrish, cardiology to see in the emergency department. MDM   Final diagnoses:  Chest pain   Cardiology has assessed and plan for admission for rule out for MI and stress testing. Patient is alert and in stable condition without  active chest pain. He has risk factors of hypertension and hyperlipidemia.    Arby BarretteMarcy Annaly Skop, MD 11/11/15 1224

## 2015-11-11 NOTE — H&P (Signed)
Patient ID: Keith Vance MRN: [id number], DOB/AGE: [birth year]   Admit date: 11/11/2015   Primary Physician: [facility] Primary Cardiologist: new  Pt. Profile:  Keith Vance is a morbidly obese 55 year old Caucasian male with PMH of hypertension and hyperlipidemia presented with chest pain which lasted 20 minutes at rest.  Problem List  Past Medical History  Diagnosis Date  . Hypertension   . Hyperlipidemia   . Arthritis     Past Surgical History  Procedure Laterality Date  . Hernia repair       Allergies  No Known Allergies  HPI  Keith Vance is a morbidly obese 55 year old Caucasian male with PMH of hypertension and hyperlipidemia. He reportedly had a clean cardiac catheterization at [facility] in 1999 when his first wife passed away and he passed out from exhaustion after not sleeping for 4 days. Since then, he has been doing relatively well. According to his wife, he stays active. He works as a Psychologist, occupationalwelder, at home he frequently move things around and do heavy activities. He denies any recent exertional symptom, declining functional status or any chest pain during physical exertion. He denies any recent lower extremity edema, orthopnea or paroxysmal nocturnal dyspnea. Last night he had some chills but no fever or cough.  This morning, he went to work at 4:00AM. While talking to his coworkers around 4:30, he became diaphoretic, short of breath, and had sharp right sided chest pain radiating to the back shoulder blade. It lasted roughly 20 minutes before resolving and has not recurred since. He [facility] medical [facility]. Initial troponin was negative. EKG shows no acute changes. CBC and BMET were negative. Chest x-ray shows bibasilar atelectasis, otherwise no acute process. Cardiology has been consulted for chest pain.   Home Medications  Prior to Admission medications   Medication Sig Start Date End Date Taking? Authorizing Provider    amLODipine (NORVASC) 10 MG tablet Take 10 mg by mouth daily. 02/06/15  Yes Historical Provider, MD  lisinopril-hydrochlorothiazide (PRINZIDE,ZESTORETIC) 20-12.5 MG tablet Take 1 tablet by mouth daily.   Yes Historical Provider, MD  omega-3 acid ethyl esters (LOVAZA) 1 g capsule Take 2 g by mouth daily.   Yes Historical Provider, MD  Red Yeast Rice Extract (RED YEAST RICE PO) Take 1 tablet by mouth daily.   Yes Historical Provider, MD  traMADol (ULTRAM) 50 MG tablet Take 50 mg by mouth every 6 (six) hours as needed for moderate pain.   Yes Historical Provider, MD    Family History  Family History  Problem Relation Age of Onset  . Heart attack Maternal Grandmother     80>60s of age  . Hypertension Father   . Diabetes Father     Social History  Social History   Social History  . Marital Status: Single    Spouse Name: N/A  . Number of Children: N/A  . Years of Education: N/A   Occupational History  . Not on file.   Social History Main Topics  . Smoking status: Former Smoker    Quit date: 11/11/1979  . Smokeless tobacco: Never Used  . Alcohol Use: No  . Drug Use: No  . Sexual Activity: Yes   Other Topics Concern  . Not on file   Social History Narrative     Review of Systems General:  No chills, fever, night sweats or weight changes.  Cardiovascular:  No dyspnea on exertion, edema, orthopnea, palpitations, paroxysmal nocturnal dyspnea. +chest pain, diaphoresis, SOB  Dermatological: No rash, lesions/masses Respiratory: No cough Urologic: No hematuria, dysuria Abdominal:   No nausea, vomiting, diarrhea, bright red blood per rectum, melena, or hematemesis Neurologic:  No visual changes, wkns, changes in mental status. All other systems reviewed and are otherwise negative except as noted above.  Physical Exam  Blood pressure 113/82, pulse 78, temperature 98.3 F (36.8 C), temperature source Oral, resp. rate 15, height 5' 10.5" (1.791 m), weight 281 lb (127.461 kg), SpO2  92 %.  General: Pleasant, NAD Psych: Normal affect. Neuro: Alert and oriented X 3. Moves all extremities spontaneously. HEENT: Normal  Neck: Supple without bruits or JVD. Lungs:  Resp regular and unlabored, CTA. Heart: RRR no s3, s4, or murmurs. Abdomen: Soft, non-tender, non-distended, BS + x 4.  Extremities: No clubbing, cyanosis or edema. DP/PT/Radials 2+ and equal bilaterally.  Labs  Troponin [facility] Care Test)  Recent Labs  11/11/15 0557  TROPIPOC 0.00   No results for input(s): CKTOTAL, CKMB, TROPONINI in the last 72 hours. Lab Results  Component Value Date   WBC 7.4 11/11/2015   HGB 16.2 11/11/2015   HCT 47.2 11/11/2015   MCV 90.6 11/11/2015   PLT 148* 11/11/2015     Recent Labs Lab 11/11/15 0533  NA 137  K 3.5  CL 102  CO2 24  BUN 18  CREATININE 0.75  CALCIUM 8.7*  PROT 6.7  BILITOT 1.4*  ALKPHOS 58  ALT 147*  AST 66*  GLUCOSE 113*   No results found for: CHOL, HDL, LDLCALC, TRIG No results found for: DDIMER   Radiology/Studies  Dg Chest 2 View  11/11/2015  CLINICAL DATA:  Centralized chest pain, shortness of breath, diaphoresis tonight. History of hypertension, hyperlipidemia. EXAM: CHEST  2 VIEW COMPARISON:  None available for comparison at time of study interpretation. FINDINGS: Cardiac silhouette is mildly enlarged, mediastinal silhouette is normal. Low inspiratory examination with bibasilar atelectasis. No pleural effusion or focal consolidation. Mildly elevated RIGHT hemidiaphragm. No pneumothorax. Soft tissue planes and included osseous structures are nonsuspicious. IMPRESSION: Mild cardiomegaly and bibasilar atelectasis. Electronically Signed   By: Awilda Metro M.D.   On: 11/11/2015 05:57    ECG  Normal sinus rhythm without significant ST-T wave changes.   ASSESSMENT AND PLAN  1. Chest pain at rest  - No EKG or troponin to support ACS, characteristic of chest pain somewhat atypical. Recent heavy activity without chest  discomfort.  - Cardia risk factors include obesity, hypertension, hyperlipidemia, and family history of CAD.  - Will discuss with M.D. regarding trend trop overnight and obtaining Myoview in AM. His BP is normal, relatively low suspicion for dissection  2. HTN: stable  3. HLD   Signed, Azalee Course, PA-C 11/11/2015, 10:45 AM  Agree with note by Azalee Course PA-C  Pt came to ER with prolonged atyp CP with RUE radiation and SOB. + CRF. Currently pain free. Exam benign. Labs OK. Will admit, cycle enz, check d-dimer, 2D and MV. If neg home tomorrow.   Runell Gess, M.D., FACP, [facility], [facility] [address] [place], [place]  [address]  [phone] 11/11/2015 12:58 PM

## 2015-11-11 NOTE — ED Notes (Signed)
Ordered pt a heart healthy lunch tray. Spoke w/ Uvaldo Bristleatjana.

## 2015-11-11 NOTE — ED Notes (Signed)
Called dietary and had the location of pt's lunch tray moved to floor/room that he is going to. Uvaldo Bristleatjana said she would call the floor and let them know.

## 2015-11-12 ENCOUNTER — Inpatient Hospital Stay (HOSPITAL_COMMUNITY): Payer: BLUE CROSS/BLUE SHIELD

## 2015-11-12 ENCOUNTER — Observation Stay (HOSPITAL_COMMUNITY): Payer: BLUE CROSS/BLUE SHIELD

## 2015-11-12 ENCOUNTER — Encounter (HOSPITAL_COMMUNITY): Payer: Self-pay | Admitting: Internal Medicine

## 2015-11-12 ENCOUNTER — Ambulatory Visit (HOSPITAL_COMMUNITY): Payer: BLUE CROSS/BLUE SHIELD

## 2015-11-12 DIAGNOSIS — R140 Abdominal distension (gaseous): Secondary | ICD-10-CM | POA: Diagnosis not present

## 2015-11-12 DIAGNOSIS — R072 Precordial pain: Secondary | ICD-10-CM | POA: Diagnosis present

## 2015-11-12 DIAGNOSIS — Z6839 Body mass index (BMI) 39.0-39.9, adult: Secondary | ICD-10-CM | POA: Diagnosis not present

## 2015-11-12 DIAGNOSIS — I129 Hypertensive chronic kidney disease with stage 1 through stage 4 chronic kidney disease, or unspecified chronic kidney disease: Secondary | ICD-10-CM | POA: Diagnosis present

## 2015-11-12 DIAGNOSIS — Z8249 Family history of ischemic heart disease and other diseases of the circulatory system: Secondary | ICD-10-CM | POA: Diagnosis not present

## 2015-11-12 DIAGNOSIS — K5669 Other intestinal obstruction: Secondary | ICD-10-CM

## 2015-11-12 DIAGNOSIS — I959 Hypotension, unspecified: Secondary | ICD-10-CM | POA: Diagnosis not present

## 2015-11-12 DIAGNOSIS — N189 Chronic kidney disease, unspecified: Secondary | ICD-10-CM | POA: Diagnosis present

## 2015-11-12 DIAGNOSIS — I712 Thoracic aortic aneurysm, without rupture: Secondary | ICD-10-CM

## 2015-11-12 DIAGNOSIS — Z87891 Personal history of nicotine dependence: Secondary | ICD-10-CM | POA: Diagnosis not present

## 2015-11-12 DIAGNOSIS — E860 Dehydration: Secondary | ICD-10-CM | POA: Diagnosis not present

## 2015-11-12 DIAGNOSIS — R079 Chest pain, unspecified: Secondary | ICD-10-CM | POA: Diagnosis present

## 2015-11-12 DIAGNOSIS — K565 Intestinal adhesions [bands] with obstruction (postprocedural) (postinfection): Secondary | ICD-10-CM | POA: Diagnosis not present

## 2015-11-12 DIAGNOSIS — E872 Acidosis: Secondary | ICD-10-CM | POA: Diagnosis not present

## 2015-11-12 DIAGNOSIS — E785 Hyperlipidemia, unspecified: Secondary | ICD-10-CM | POA: Diagnosis present

## 2015-11-12 DIAGNOSIS — I10 Essential (primary) hypertension: Secondary | ICD-10-CM

## 2015-11-12 DIAGNOSIS — Z79899 Other long term (current) drug therapy: Secondary | ICD-10-CM | POA: Diagnosis not present

## 2015-11-12 LAB — GLUCOSE, CAPILLARY
Glucose-Capillary: 106 mg/dL — ABNORMAL HIGH (ref 65–99)
Glucose-Capillary: 131 mg/dL — ABNORMAL HIGH (ref 65–99)
Glucose-Capillary: 82 mg/dL (ref 65–99)
Glucose-Capillary: 90 mg/dL (ref 65–99)
Glucose-Capillary: 97 mg/dL (ref 65–99)

## 2015-11-12 LAB — BASIC METABOLIC PANEL
Anion gap: 11 (ref 5–15)
BUN: 12 mg/dL (ref 6–20)
CO2: 28 mmol/L (ref 22–32)
Calcium: 8.8 mg/dL — ABNORMAL LOW (ref 8.9–10.3)
Chloride: 99 mmol/L — ABNORMAL LOW (ref 101–111)
Creatinine, Ser: 0.82 mg/dL (ref 0.61–1.24)
GFR calc Af Amer: >60 mL/min (ref >60)
GFR calc non Af Amer: >60 mL/min (ref >60)
Glucose, Bld: 111 mg/dL — ABNORMAL HIGH (ref 65–99)
Potassium: 3.7 mmol/L (ref 3.5–5.1)
Sodium: 138 mmol/L (ref 135–145)

## 2015-11-12 LAB — LIPID PANEL
Cholesterol: 171 mg/dL (ref 0–200)
HDL: 53 mg/dL (ref >40)
LDL Cholesterol: 100 mg/dL — ABNORMAL HIGH (ref 0–99)
Total CHOL/HDL Ratio: 3.2 RATIO
Triglycerides: 91 mg/dL (ref <150)
VLDL: 18 mg/dL (ref 0–40)

## 2015-11-12 LAB — CBC
HCT: 50.1 % (ref 39.0–52.0)
Hemoglobin: 16.6 g/dL (ref 13.0–17.0)
MCH: 30.3 pg (ref 26.0–34.0)
MCHC: 33.1 g/dL (ref 30.0–36.0)
MCV: 91.4 fL (ref 78.0–100.0)
Platelets: 166 10*3/uL (ref 150–400)
RBC: 5.48 MIL/uL (ref 4.22–5.81)
RDW: 13.2 % (ref 11.5–15.5)
WBC: 8.5 10*3/uL (ref 4.0–10.5)

## 2015-11-12 LAB — LACTIC ACID, PLASMA
Lactic Acid, Venous: 2.2 mmol/L (ref 0.5–2.0)
Lactic Acid, Venous: 2.1 mmol/L (ref 0.5–2.0)

## 2015-11-12 LAB — TROPONIN I: Troponin I: <0.03 ng/mL (ref <0.031)

## 2015-11-12 MED ORDER — DIATRIZOATE MEGLUMINE & SODIUM 66-10 % PO SOLN
90.0000 mL | Freq: Once | ORAL | Status: AC
  Administered 2015-11-12: 90 mL via NASOGASTRIC

## 2015-11-12 MED ORDER — DIATRIZOATE MEGLUMINE & SODIUM 66-10 % PO SOLN
ORAL | Status: AC
  Filled 2015-11-12: qty 90

## 2015-11-12 MED ORDER — SODIUM CHLORIDE 0.9 % IV BOLUS (SEPSIS)
2000.0000 mL | Freq: Once | INTRAVENOUS | Status: AC
  Administered 2015-11-12: 2000 mL via INTRAVENOUS

## 2015-11-12 MED ORDER — PROMETHAZINE HCL 25 MG PO TABS
25.0000 mg | ORAL_TABLET | Freq: Once | ORAL | Status: AC
  Administered 2015-11-12: 25 mg via ORAL
  Filled 2015-11-12: qty 1

## 2015-11-12 MED ORDER — IOHEXOL 300 MG/ML  SOLN: 100.0000 mL | Freq: Once | INTRAMUSCULAR | Status: DC | PRN

## 2015-11-12 MED ORDER — ASPIRIN 300 MG RE SUPP: 300.0000 mg | Freq: Every day | RECTAL | Status: DC

## 2015-11-12 MED ORDER — HYDRALAZINE HCL 20 MG/ML IJ SOLN: 10.0000 mg | INTRAMUSCULAR | Status: DC | PRN

## 2015-11-12 MED ORDER — IOHEXOL 300 MG/ML  SOLN
25.0000 mL | INTRAMUSCULAR | Status: AC
  Administered 2015-11-12 (×2): 25 mL via ORAL

## 2015-11-12 MED ORDER — DEXTROSE-NACL 5-0.9 % IV SOLN
INTRAVENOUS | Status: AC
  Administered 2015-11-12: 05:00:00 via INTRAVENOUS

## 2015-11-12 MED ORDER — MORPHINE SULFATE (PF) 4 MG/ML IV SOLN
4.0000 mg | Freq: Once | INTRAVENOUS | Status: AC
  Administered 2015-11-12: 4 mg via INTRAVENOUS
  Filled 2015-11-12: qty 1

## 2015-11-12 MED ORDER — SODIUM CHLORIDE 0.9 % IV BOLUS (SEPSIS): 1000.0000 mL | Freq: Once | INTRAVENOUS | Status: DC

## 2015-11-12 NOTE — Progress Notes (Signed)
Patient notified RN of increasing abdominal distention, pain, and nausea. Patient states that he has had two hospital admissions in the past year for these same symptoms. Patient states "I had two bowel obstructions and they had to pump my stomach both times." Bowel sounds present in all four quadrants. Zofran was given which improved patient's nausea. Notified Dr. Onalee HuaAlvarez of patient's symptoms; orders for abdominal xray received. Will continue to monitor.

## 2015-11-12 NOTE — Consult Note (Addendum)
Reason for Consult: Nausea vomiting. Referring Physician: Dr. Philbert Riser. Cardiologist.  Keith Vance is an 55 y.o. male.  HPI: Please to hypertension and previous history of small bowel obstruction presents to the ER with chest pain. Patient was seen by cardiologist and was planning to have stress test done today when at around 3 AM patient started developing abdominal discomfort with nausea vomiting. X-ray shows features concerning for ileus versus obstruction. Patient has had NG tube placed and almost 1 L suction done. Patient denies any chest pain or shortness of breath at this time. Abdomen appears distended. Patient last bowel movement was yesterday.  Past Medical History  Diagnosis Date  . Hypertension   . Hyperlipidemia   . Arthritis   . Sleep apnea     Past Surgical History  Procedure Laterality Date  . Hernia repair      Family History  Problem Relation Age of Onset  . Heart attack Maternal Grandmother     >55s of age  . Hypertension Father   . Diabetes Father     Social History:  reports that he quit smoking about 36 years ago. He has never used smokeless tobacco. He reports that he does not drink alcohol or use illicit drugs.  Allergies: No Known Allergies  Medications: I have reviewed the patient's current medications.  Results for orders placed or performed during the hospital encounter of 11/11/15 (from the past 48 hour(s))  Basic metabolic panel     Status: Abnormal   Collection Time: 11/11/15  5:33 AM  Result Value Ref Range   Sodium 137 135 - 145 mmol/L   Potassium 3.5 3.5 - 5.1 mmol/L   Chloride 102 101 - 111 mmol/L   CO2 24 22 - 32 mmol/L   Glucose, Bld 113 (H) 65 - 99 mg/dL   BUN 18 6 - 20 mg/dL   Creatinine, Ser 0.75 0.61 - 1.24 mg/dL   Calcium 8.7 (L) 8.9 - 10.3 mg/dL   GFR calc non Af Amer >60 >60 mL/min   GFR calc Af Amer >60 >60 mL/min    Comment: (NOTE) The eGFR has been calculated using the CKD EPI equation. This calculation has not been  validated in all clinical situations. eGFR's persistently <60 mL/min signify possible Chronic Kidney Disease.    Anion gap 11 5 - 15  CBC     Status: Abnormal   Collection Time: 11/11/15  5:33 AM  Result Value Ref Range   WBC 7.4 4.0 - 10.5 K/uL   RBC 5.21 4.22 - 5.81 MIL/uL   Hemoglobin 16.2 13.0 - 17.0 g/dL   HCT 47.2 39.0 - 52.0 %   MCV 90.6 78.0 - 100.0 fL   MCH 31.1 26.0 - 34.0 pg   MCHC 34.3 30.0 - 36.0 g/dL   RDW 13.4 11.5 - 15.5 %   Platelets 148 (L) 150 - 400 K/uL  Hepatic function panel     Status: Abnormal   Collection Time: 11/11/15  5:33 AM  Result Value Ref Range   Total Protein 6.7 6.5 - 8.1 g/dL   Albumin 3.8 3.5 - 5.0 g/dL   AST 66 (H) 15 - 41 U/L   ALT 147 (H) 17 - 63 U/L   Alkaline Phosphatase 58 38 - 126 U/L   Total Bilirubin 1.4 (H) 0.3 - 1.2 mg/dL   Bilirubin, Direct 0.3 0.1 - 0.5 mg/dL   Indirect Bilirubin 1.1 (H) 0.3 - 0.9 mg/dL  Lipase, blood     Status: None  Collection Time: 11/11/15  5:33 AM  Result Value Ref Range   Lipase 25 11 - 51 U/L  D-dimer, quantitative (not at [facility])     Status: Abnormal   Collection Time: 11/11/15  5:33 AM  Result Value Ref Range   D-Dimer, Quant 2.65 (H) 0.00 - 0.50 ug/mL-FEU    Comment: (NOTE) At the manufacturer cut-off of 0.50 ug/mL FEU, this assay has been documented to exclude PE with a sensitivity and negative predictive value of 97 to 99%.  At this time, this assay has not been approved by the FDA to exclude DVT/VTE. Results should be correlated with clinical presentation.   I-stat troponin, ED (not at [facility], [facility])     Status: None   Collection Time: 11/11/15  5:57 AM  Result Value Ref Range   Troponin i, poc 0.00 0.00 - 0.08 ng/mL   Comment 3            Comment: Due to the release kinetics of cTnI, a negative result within the first hours of the onset of symptoms does not rule out myocardial infarction with certainty. If myocardial infarction is still suspected, repeat the test at appropriate  intervals.   Troponin I     Status: None   Collection Time: 11/11/15 10:51 AM  Result Value Ref Range   Troponin I <0.03 <0.031 ng/mL    Comment:        NO INDICATION OF MYOCARDIAL INJURY.   CBC     Status: Abnormal   Collection Time: 11/11/15  1:03 PM  Result Value Ref Range   WBC 6.1 4.0 - 10.5 K/uL   RBC 4.79 4.22 - 5.81 MIL/uL   Hemoglobin 14.7 13.0 - 17.0 g/dL   HCT 43.9 39.0 - 52.0 %   MCV 91.6 78.0 - 100.0 fL   MCH 30.7 26.0 - 34.0 pg   MCHC 33.5 30.0 - 36.0 g/dL   RDW 13.3 11.5 - 15.5 %   Platelets 144 (L) 150 - 400 K/uL  Creatinine, serum     Status: None   Collection Time: 11/11/15  1:03 PM  Result Value Ref Range   Creatinine, Ser 0.79 0.61 - 1.24 mg/dL   GFR calc non Af Amer >60 >60 mL/min   GFR calc Af Amer >60 >60 mL/min    Comment: (NOTE) The eGFR has been calculated using the CKD EPI equation. This calculation has not been validated in all clinical situations. eGFR's persistently <60 mL/min signify possible Chronic Kidney Disease.   Troponin I     Status: None   Collection Time: 11/11/15  1:03 PM  Result Value Ref Range   Troponin I <0.03 <0.031 ng/mL    Comment:        NO INDICATION OF MYOCARDIAL INJURY.   Troponin I     Status: None   Collection Time: 11/11/15  6:36 PM  Result Value Ref Range   Troponin I <0.03 <0.031 ng/mL    Comment:        NO INDICATION OF MYOCARDIAL INJURY.   Troponin I     Status: None   Collection Time: 11/11/15 11:40 PM  Result Value Ref Range   Troponin I <0.03 <0.031 ng/mL    Comment:        NO INDICATION OF MYOCARDIAL INJURY.   Basic metabolic panel     Status: Abnormal   Collection Time: 11/11/15 11:40 PM  Result Value Ref Range   Sodium 138 135 - 145 mmol/L   Potassium 3.7 3.5 -  5.1 mmol/L   Chloride 99 (L) 101 - 111 mmol/L   CO2 28 22 - 32 mmol/L   Glucose, Bld 111 (H) 65 - 99 mg/dL   BUN 12 6 - 20 mg/dL   Creatinine, Ser 0.82 0.61 - 1.24 mg/dL   Calcium 8.8 (L) 8.9 - 10.3 mg/dL   GFR calc non Af Amer  >60 >60 mL/min   GFR calc Af Amer >60 >60 mL/min    Comment: (NOTE) The eGFR has been calculated using the CKD EPI equation. This calculation has not been validated in all clinical situations. eGFR's persistently <60 mL/min signify possible Chronic Kidney Disease.    Anion gap 11 5 - 15  Lipid panel     Status: Abnormal   Collection Time: 11/11/15 11:40 PM  Result Value Ref Range   Cholesterol 171 0 - 200 mg/dL   Triglycerides 91 <150 mg/dL   HDL 53 >40 mg/dL   Total CHOL/HDL Ratio 3.2 RATIO   VLDL 18 0 - 40 mg/dL   LDL Cholesterol 100 (H) 0 - 99 mg/dL    Comment:        Total Cholesterol/HDL:CHD Risk Coronary Heart Disease Risk Table                     Men   Women  1/2 Average Risk   3.4   3.3  Average Risk       5.0   4.4  2 X Average Risk   9.6   7.1  3 X Average Risk  23.4   11.0        Use the calculated Patient Ratio above and the CHD Risk Table to determine the patient's CHD Risk.        ATP III CLASSIFICATION (LDL):  <100     mg/dL   Optimal  100-129  mg/dL   Near or Above                    Optimal  130-159  mg/dL   Borderline  160-189  mg/dL   High  >190     mg/dL   Very High     Dg Chest 2 View  11/11/2015  CLINICAL DATA:  Centralized chest pain, shortness of breath, diaphoresis tonight. History of hypertension, hyperlipidemia. EXAM: CHEST  2 VIEW COMPARISON:  None available for comparison at time of study interpretation. FINDINGS: Cardiac silhouette is mildly enlarged, mediastinal silhouette is normal. Low inspiratory examination with bibasilar atelectasis. No pleural effusion or focal consolidation. Mildly elevated RIGHT hemidiaphragm. No pneumothorax. Soft tissue planes and included osseous structures are nonsuspicious. IMPRESSION: Mild cardiomegaly and bibasilar atelectasis. Electronically Signed   By: Elon Alas M.D.   On: 11/11/2015 05:57   Dg Abd 1 View  11/12/2015  CLINICAL DATA:  Acute onset of generalized abdominal pain and distention. Nausea  and vomiting. Initial encounter. EXAM: ABDOMEN - 1 VIEW COMPARISON:  Abdominal radiograph performed 12/25/2014 FINDINGS: There is distention of small bowel loops up to 4.9 cm in maximal diameter, raising question for some degree of underlying bowel obstruction, though small bowel dysmotility might have a similar appearance. Mild residual air is seen within the colon. No free intra-abdominal air is seen, though evaluation for free air is limited on supine views. The visualized osseous structures are grossly unremarkable. Contrast is seen within the bladder. IMPRESSION: Distention of the small bowel loops up to 4.9 cm maximal diameter, raising question for some degree of underlying bowel obstruction, though small  bowel dysmotility might have a similar appearance. No free intra-abdominal air seen. Electronically Signed   By: Garald Balding M.D.   On: 11/12/2015 03:29   Ct Angio Chest Pe W/cm &/or Wo Cm  11/11/2015  CLINICAL DATA:  Short of breath and chest pain for 3 days EXAM: CT ANGIOGRAPHY CHEST WITH CONTRAST TECHNIQUE: Multidetector CT imaging of the chest was performed using the standard protocol during bolus administration of intravenous contrast. Multiplanar CT image reconstructions and MIPs were obtained to evaluate the vascular anatomy. CONTRAST:  163m OMNIPAQUE IOHEXOL 350 MG/ML SOLN COMPARISON:  None. FINDINGS: There are no filling defects in the pulmonary arterial tree to suggest acute pulmonary thromboembolism. No evidence of aortic dissection or intramural hematoma. Maximal diameter of the ascending aorta is 4.4 cm. No abnormal mediastinal adenopathy based on measurement criteria. The heart is enlarged. No pneumothorax.  No pleural effusion. Dependent atelectasis bilaterally.  No consolidation or lung mass. No vertebral compression deformity. Review of the MIP images confirms the above findings. IMPRESSION: No evidence of acute pulmonary thromboembolism. Aneurysmal dilatation in of the ascending aorta  measures up to 4.4 cm Recommend annual imaging followup by CTA or MRA. This recommendation follows 2010 ACCF/AHA/AATS/ACR/ASA/SCA/SCAI/SIR/STS/SVM Guidelines for the Diagnosis and Management of Patients with Thoracic Aortic Disease. Circulation. 2010; 121: [id number] Signed   By: AMarybelle KillingsM.D.   On: 11/11/2015 19:16    Review of Systems  Constitutional: Negative.   Eyes: Negative.   Respiratory: Negative.   Cardiovascular: Negative.   Gastrointestinal: Positive for nausea and vomiting.  Genitourinary: Negative.   Musculoskeletal: Negative.   Skin: Negative.   Neurological: Negative.   Endo/Heme/Allergies: Negative.   Psychiatric/Behavioral: Negative.    Blood pressure 117/69, pulse 70, temperature 98.5 F (36.9 C), temperature source Oral, resp. rate 20, height 5' 10.5" (1.791 m), weight 281 lb (127.461 kg), SpO2 95 %. Physical Exam  Constitutional: He is oriented to person, place, and time. He appears well-developed and well-nourished. No distress.  HENT:  Head: Normocephalic and atraumatic.  Eyes: Conjunctivae are normal. Pupils are equal, round, and reactive to light. Right eye exhibits no discharge. Left eye exhibits no discharge. No scleral icterus.  Neck: Normal range of motion.  Cardiovascular: Normal rate and regular rhythm.   Respiratory: Effort normal and breath sounds normal. No respiratory distress. He has no wheezes. He has no rales.  GI: Soft. He exhibits distension.  Musculoskeletal: Normal range of motion. He exhibits no edema or tenderness.  Neurological: He is alert and oriented to person, place, and time.  Skin: Skin is warm and dry. He is not diaphoretic.  Psychiatric: His behavior is normal.    Assessment/Plan: #1. Small bowel obstruction - patient has been placed on NG tube with low volume suction. CT abdomen and pelvis is pending. Will need surgical input in a.m. Patient will be kept on nothing by mouth and D5 normal saline. #2. Hypotension  - patient blood pressure is in the low normal. We'll hold antihypertensives and place patient on when necessary IV hydralazine. I have ordered 2 L normal saline bolus. Patient does not look septic. Follow lactic acid levels. Check chest x-ray. #3. Admitted for chest pain and further workup per cardiology.  Thanks for involving [place] patient's care. We will follow along with you.  Shequilla Goodgame N. 11/12/2015, 4:56 AM

## 2015-11-12 NOTE — Progress Notes (Addendum)
[facility]                                                                              Patient Demographics  Keith Vance, is a 55 y.o. male, DOB - [birth year], ZOX:[id number]  Admit date - 11/11/2015   Admitting Physician Runell GessJonathan J Berry, MD  Outpatient Primary [facility] the [facility] is [facility]  LOS -   days    Chief Complaint  Patient presents with  . Chest Pain       Brief HPI  Patient is a 55 year old male with hypertension, hyperlipidemia, sleep apnea, prior history of SBO, hernia repair surgery presented to ED with chest pain. Patient was admitted by cardiology and planning to have stress test done today however around 3 AM patient started developing abdominal discomfort, distention with nausea and vomiting. Abdominal x-ray was concerning for ileus versus obstruction. Patient had NG tube placed and 1 L suctioned out. Internal medicine consult was obtained.  Assessment & Plan    Principal Problem:   SBO (small bowel obstruction) (HCC): Prior history of SBO, adhesions, prior hernia repair surgery - Patient seen and examined, still hypoactive bowel sounds, denies any abdominal pain, NG tube was placed, however clamped due to oral contrast given. Patient reports small 3-4 BMs and flatus this morning - Awaiting CT abdomen and pelvis, place NG tube to intermittent wall suction, continue NPO status, IVF - Consult surgery pending CT abdomen results   Active Problems:  Lactic acidosis likely due to #1, dehydration - Continue IV fluid hydration - Hold HCTZ     Hypertension - Currently BP stable, hold oral antihypertensives - Continue IV hydralazine as needed with parameters    Chest pain at rest - Patient has been seen by cardiology, planning stress test, currently on hold - Further management per cardiology - Currently receiving aspirin PR - CT and a gram test negative for pulmonary embolism  Ascending aortic aneurysm incidentally seen on CT  angiogram chest Aneurysmal dilatation in of the ascending aorta measures up to 4.4 cm Recommend annual imaging followup by CTA or MRA  Code Status: Full CODE STATUS   Family Communication: Discussed in detail with the patient, all imaging results, lab results explained to the patient   Disposition Plan:   Time Spent in minutes   25 minutes  Procedures  NGT   Consults   Cardiology   DVT Prophylaxis   heparin  Medications  Scheduled Meds: . aspirin  300 mg Rectal Daily  . heparin  5,000 Units Subcutaneous 3 times per day   Continuous Infusions: . dextrose 5 % and 0.9% NaCl 100 mL/hr at 11/12/15 0521   PRN Meds:.acetaminophen, hydrALAZINE, iohexol, nitroGLYCERIN, ondansetron (ZOFRAN) IV   Antibiotics   Anti-infectives    None        Subjective:   Keith KittenRoy Peregrina was seen and examined today.   chest pain improved at the time of my examination however abdomen still distended, nausea, reports 3-4 small BMs this morning with flatus. Patient denies dizziness, chest pain, shortness of breath, new weakness, numbess, tingling.  Objective:   Filed Vitals:   11/12/15 0535 11/12/15 0630  11/12/15 0700 11/12/15 0827  BP: 92/55 112/67 124/68 114/70  Pulse:    82  Temp:    99.6 F (37.6 C)  TempSrc:    Oral  Resp:      Height:      Weight:      SpO2:    92%    Intake/Output Summary (Last 24 hours) at 11/12/15 1111 Last data filed at 11/12/15 1610  Gross per 24 hour  Intake    480 ml  Output   1000 ml  Net   -520 ml     Wt Readings from Last 3 Encounters:  11/12/15 120.8 kg (266 lb 5.1 oz)  08/25/15 128.731 kg (283 lb 12.8 oz)  02/27/15 121.11 kg (267 lb)     Exam  General: Alert and oriented x 3, NAD  HEENT:  PERRLA, EOMI, Anicteric Sclera, mucous membranes moist.   Neck: Supple, no JVD, no masses  CVS: S1 S2 auscultated, no rubs, murmurs or gallops. Regular rate and rhythm.  Respiratory: Clear to auscultation bilaterally, no wheezing, rales or  rhonchi  Abdomen: Soft, distended, hypoactive bowel sounds, no significant tenderness   Ext: no cyanosis clubbing or edema  Neuro: AAOx3, Cr N's II- XII. Strength 5/5 upper and lower extremities bilaterally  Skin: No rashes  Psych: Normal affect and demeanor, alert and oriented x3    Data Reviewed:  I have personally reviewed following labs and imaging studies  Micro Results No results found for this or any previous visit (from the past 240 hour(s)).  Radiology Reports Dg Chest 2 View  11/11/2015  CLINICAL DATA:  Centralized chest pain, shortness of breath, diaphoresis tonight. History of hypertension, hyperlipidemia. EXAM: CHEST  2 VIEW COMPARISON:  None available for comparison at time of study interpretation. FINDINGS: Cardiac silhouette is mildly enlarged, mediastinal silhouette is normal. Low inspiratory examination with bibasilar atelectasis. No pleural effusion or focal consolidation. Mildly elevated RIGHT hemidiaphragm. No pneumothorax. Soft tissue planes and included osseous structures are nonsuspicious. IMPRESSION: Mild cardiomegaly and bibasilar atelectasis. Electronically Signed   By: Awilda Metro M.D.   On: 11/11/2015 05:57   Dg Abd 1 View  11/12/2015  CLINICAL DATA:  Acute onset of generalized abdominal pain and distention. Nausea and vomiting. Initial encounter. EXAM: ABDOMEN - 1 VIEW COMPARISON:  Abdominal radiograph performed 12/25/2014 FINDINGS: There is distention of small bowel loops up to 4.9 cm in maximal diameter, raising question for some degree of underlying bowel obstruction, though small bowel dysmotility might have a similar appearance. Mild residual air is seen within the colon. No free intra-abdominal air is seen, though evaluation for free air is limited on supine views. The visualized osseous structures are grossly unremarkable. Contrast is seen within the bladder. IMPRESSION: Distention of the small bowel loops up to 4.9 cm maximal diameter, raising  question for some degree of underlying bowel obstruction, though small bowel dysmotility might have a similar appearance. No free intra-abdominal air seen. Electronically Signed   By: Roanna Raider M.D.   On: 11/12/2015 03:29   Ct Angio Chest Pe W/cm &/or Wo Cm  11/11/2015  CLINICAL DATA:  Short of breath and chest pain for 3 days EXAM: CT ANGIOGRAPHY CHEST WITH CONTRAST TECHNIQUE: Multidetector CT imaging of the chest was performed using the standard protocol during bolus administration of intravenous contrast. Multiplanar CT image reconstructions and MIPs were obtained to evaluate the vascular anatomy. CONTRAST:  OMNIPAQUE IOHEXOL 350 MG/ML SOLN COMPARISON:  None. FINDINGS: There are no filling defects in the  pulmonary arterial tree to suggest acute pulmonary thromboembolism. No evidence of aortic dissection or intramural hematoma. Maximal diameter of the ascending aorta is 4.4 cm. No abnormal mediastinal adenopathy based on measurement criteria. The heart is enlarged. No pneumothorax.  No pleural effusion. Dependent atelectasis bilaterally.  No consolidation or lung mass. No vertebral compression deformity. Review of the MIP images confirms the above findings. IMPRESSION: No evidence of acute pulmonary thromboembolism. 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 This recommendation follows 2010 ACCF/AHA/AATS/ACR/ASA/SCA/SCAI/SIR/STS/SVM Guidelines for the Diagnosis and Management of Patients with Thoracic Aortic Disease. Circulation. 2010; 121: Z610-R604 Electronically Signed   By: Jolaine Click M.D.   On: 11/11/2015 19:16   Dg Chest Port 1 View  11/12/2015  CLINICAL DATA:  Mid chest pain.  Hypotension EXAM: PORTABLE CHEST 1 VIEW COMPARISON:  Yesterday FINDINGS: Apparent cardiomegaly and blunting of the lateral left costophrenic sulcus correlates with a large mediastinal fat pad on CT from yesterday. Negative aortic and hilar  contours. There is no edema, consolidation, effusion, or pneumothorax. There is a nasogastric which at least reaches the stomach. IMPRESSION: No evidence of active disease. Electronically Signed   By: Marnee Spring M.D.   On: 11/12/2015 06:42    CBC  Recent Labs Lab 11/11/15 0533 11/11/15 1303 11/12/15 0440  WBC 7.4 6.1 8.5  HGB 16.2 14.7 16.6  HCT 47.2 43.9 50.1  PLT 148* 144* 166  MCV 90.6 91.6 91.4  MCH 31.1 30.7 30.3  MCHC 34.3 33.5 33.1  RDW 13.4 13.3 13.2    Chemistries   Recent Labs Lab 11/11/15 0533 11/11/15 1303 11/11/15 2340  NA 137  --  138  K 3.5  --  3.7  CL 102  --  99*  CO2 24  --  28  GLUCOSE 113*  --  111*  BUN 18  --  12  CREATININE 0.75 0.79 0.82  CALCIUM 8.7*  --  8.8*  AST 66*  --   --   ALT 147*  --   --   ALKPHOS 58  --   --   BILITOT 1.4*  --   --    ------------------------------------------------------------------------------------------------------------------ estimated creatinine clearance is 135.2 mL/min (by C-G formula based on Cr of 0.82). ------------------------------------------------------------------------------------------------------------------ No results for input(s): HGBA1C in the last 72 hours. ------------------------------------------------------------------------------------------------------------------  Recent Labs  11/11/15 2340  CHOL 171  HDL 53  LDLCALC 100*  TRIG 91  CHOLHDL 3.2   ------------------------------------------------------------------------------------------------------------------ No results for input(s): TSH, T4TOTAL, T3FREE, THYROIDAB in the last 72 hours.  Invalid input(s): FREET3 ------------------------------------------------------------------------------------------------------------------ No results for input(s): VITAMINB12, FOLATE, FERRITIN, TIBC, IRON, RETICCTPCT in the last 72 hours.  Coagulation profile No results for input(s): INR, PROTIME in the last 168 hours.   Recent  Labs  11/11/15 0533  DDIMER 2.65*    Cardiac Enzymes  Recent Labs Lab 11/11/15 1303 11/11/15 1836 11/11/15 2340  TROPONINI <0.03 <0.03 <0.03   ------------------------------------------------------------------------------------------------------------------ Invalid input(s): POCBNP   Recent Labs  11/12/15 0731  GLUCAP 131*     Aurora Rody M.D. [facility] 11/12/2015, 11:11 AM  Pager: [phone] Between 7am to 7pm - call Pager - [phone]  After 7pm go to [email or web address] - password [email or web address]  Call night coverage person covering after 7pm

## 2015-11-12 NOTE — Consult Note (Signed)
Reason for Consult: SBO Referring Physician:  Dr. Estill Cotta   HPI: Keith Vance is a 55 year old male with a history of hypertension, HLD, umbilcal hernia repair 30 years ago and left inguinal hernia repair as a child who presented with chest pains.  Last night after dinner, he developed abdominal pain, distention and nausea.  AXR suggested a bowel obstruction which was followed by a CT scan of abdomen and pelvis which demonstrated early or PSBO.  We have therefore been asked to evaluate.  The patient has had 2 admissions in 2016 with bowel obstructions, 12/28-12/29 and 7/2-7/14/16 which resolved conservatively.  He had a BM this AM and is passing flatus. NGT output since placement has been 675m. He overall feels better.    Past Medical History  Diagnosis Date  . Hypertension   . Hyperlipidemia   . Arthritis   . Sleep apnea     Past Surgical History  Procedure Laterality Date  . Hernia repair      Family History  Problem Relation Age of Onset  . Heart attack Maternal Grandmother     >>70sof age  . Hypertension Father   . Diabetes Father     Social History:  reports that he quit smoking about 36 years ago. He has never used smokeless tobacco. He reports that he does not drink alcohol or use illicit drugs.  Allergies: No Known Allergies  Medications:  Scheduled Meds: . aspirin  300 mg Rectal Daily  . diatrizoate meglumine-sodium  90 mL Per NG tube Once  . heparin  5,000 Units Subcutaneous 3 times per day   Continuous Infusions: . dextrose 5 % and 0.9% NaCl 100 mL/hr at 11/12/15 0521   PRN Meds:.acetaminophen, hydrALAZINE, iohexol, nitroGLYCERIN, ondansetron (ZOFRAN) IV   Results for orders placed or performed during the hospital encounter of 11/11/15 (from the past 48 hour(s))  Basic metabolic panel     Status: Abnormal   Collection Time: 11/11/15  5:33 AM  Result Value Ref Range   Sodium 137 135 - 145 mmol/L   Potassium 3.5 3.5 - 5.1 mmol/L   Chloride 102 101 -  111 mmol/L   CO2 24 22 - 32 mmol/L   Glucose, Bld 113 (H) 65 - 99 mg/dL   BUN 18 6 - 20 mg/dL   Creatinine, Ser 0.75 0.61 - 1.24 mg/dL   Calcium 8.7 (L) 8.9 - 10.3 mg/dL   GFR calc non Af Amer >60 >60 mL/min   GFR calc Af Amer >60 >60 mL/min    Comment: (NOTE) The eGFR has been calculated using the CKD EPI equation. This calculation has not been validated in all clinical situations. eGFR's persistently <60 mL/min signify possible Chronic Kidney Disease.    Anion gap 11 5 - 15  CBC     Status: Abnormal   Collection Time: 11/11/15  5:33 AM  Result Value Ref Range   WBC 7.4 4.0 - 10.5 K/uL   RBC 5.21 4.22 - 5.81 MIL/uL   Hemoglobin 16.2 13.0 - 17.0 g/dL   HCT 47.2 39.0 - 52.0 %   MCV 90.6 78.0 - 100.0 fL   MCH 31.1 26.0 - 34.0 pg   MCHC 34.3 30.0 - 36.0 g/dL   RDW 13.4 11.5 - 15.5 %   Platelets 148 (L) 150 - 400 K/uL  Hepatic function panel     Status: Abnormal   Collection Time: 11/11/15  5:33 AM  Result Value Ref Range   Total Protein 6.7 6.5 - 8.1  g/dL   Albumin 3.8 3.5 - 5.0 g/dL   AST 66 (H) 15 - 41 U/L   ALT 147 (H) 17 - 63 U/L   Alkaline Phosphatase 58 38 - 126 U/L   Total Bilirubin 1.4 (H) 0.3 - 1.2 mg/dL   Bilirubin, Direct 0.3 0.1 - 0.5 mg/dL   Indirect Bilirubin 1.1 (H) 0.3 - 0.9 mg/dL  Lipase, blood     Status: None   Collection Time: 11/11/15  5:33 AM  Result Value Ref Range   Lipase 25 11 - 51 U/L  D-dimer, quantitative (not at [facility])     Status: Abnormal   Collection Time: 11/11/15  5:33 AM  Result Value Ref Range   D-Dimer, Quant 2.65 (H) 0.00 - 0.50 ug/mL-FEU    Comment: (NOTE) At the manufacturer cut-off of 0.50 ug/mL FEU, this assay has been documented to exclude PE with a sensitivity and negative predictive value of 97 to 99%.  At this time, this assay has not been approved by the FDA to exclude DVT/VTE. Results should be correlated with clinical presentation.   I-stat troponin, ED (not at [facility])     Status: None   Collection Time: 11/11/15   5:57 AM  Result Value Ref Range   Troponin i, poc 0.00 0.00 - 0.08 ng/mL   Comment 3            Comment: Due to the release kinetics of cTnI, a negative result within the first hours of the onset of symptoms does not rule out myocardial infarction with certainty. If myocardial infarction is still suspected, repeat the test at appropriate intervals.   Troponin I     Status: None   Collection Time: 11/11/15 10:51 AM  Result Value Ref Range   Troponin I <0.03 <0.031 ng/mL    Comment:        NO INDICATION OF MYOCARDIAL INJURY.   CBC     Status: Abnormal   Collection Time: 11/11/15  1:03 PM  Result Value Ref Range   WBC 6.1 4.0 - 10.5 K/uL   RBC 4.79 4.22 - 5.81 MIL/uL   Hemoglobin 14.7 13.0 - 17.0 g/dL   HCT 43.9 39.0 - 52.0 %   MCV 91.6 78.0 - 100.0 fL   MCH 30.7 26.0 - 34.0 pg   MCHC 33.5 30.0 - 36.0 g/dL   RDW 13.3 11.5 - 15.5 %   Platelets 144 (L) 150 - 400 K/uL  Creatinine, serum     Status: None   Collection Time: 11/11/15  1:03 PM  Result Value Ref Range   Creatinine, Ser 0.79 0.61 - 1.24 mg/dL   GFR calc non Af Amer >60 >60 mL/min   GFR calc Af Amer >60 >60 mL/min    Comment: (NOTE) The eGFR has been calculated using the CKD EPI equation. This calculation has not been validated in all clinical situations. eGFR's persistently <60 mL/min signify possible Chronic Kidney Disease.   Troponin I     Status: None   Collection Time: 11/11/15  1:03 PM  Result Value Ref Range   Troponin I <0.03 <0.031 ng/mL    Comment:        NO INDICATION OF MYOCARDIAL INJURY.   Troponin I     Status: None   Collection Time: 11/11/15  6:36 PM  Result Value Ref Range   Troponin I <0.03 <0.031 ng/mL    Comment:        NO INDICATION OF MYOCARDIAL INJURY.   Troponin I  Status: None   Collection Time: 11/11/15 11:40 PM  Result Value Ref Range   Troponin I <0.03 <0.031 ng/mL    Comment:        NO INDICATION OF MYOCARDIAL INJURY.   Basic metabolic panel     Status: Abnormal    Collection Time: 11/11/15 11:40 PM  Result Value Ref Range   Sodium 138 135 - 145 mmol/L   Potassium 3.7 3.5 - 5.1 mmol/L   Chloride 99 (L) 101 - 111 mmol/L   CO2 28 22 - 32 mmol/L   Glucose, Bld 111 (H) 65 - 99 mg/dL   BUN 12 6 - 20 mg/dL   Creatinine, Ser 0.82 0.61 - 1.24 mg/dL   Calcium 8.8 (L) 8.9 - 10.3 mg/dL   GFR calc non Af Amer >60 >60 mL/min   GFR calc Af Amer >60 >60 mL/min    Comment: (NOTE) The eGFR has been calculated using the CKD EPI equation. This calculation has not been validated in all clinical situations. eGFR's persistently <60 mL/min signify possible Chronic Kidney Disease.    Anion gap 11 5 - 15  Lipid panel     Status: Abnormal   Collection Time: 11/11/15 11:40 PM  Result Value Ref Range   Cholesterol 171 0 - 200 mg/dL   Triglycerides 91 <150 mg/dL   HDL 53 >40 mg/dL   Total CHOL/HDL Ratio 3.2 RATIO   VLDL 18 0 - 40 mg/dL   LDL Cholesterol 100 (H) 0 - 99 mg/dL    Comment:        Total Cholesterol/HDL:CHD Risk Coronary Heart Disease Risk Table                     Men   Women  1/2 Average Risk   3.4   3.3  Average Risk       5.0   4.4  2 X Average Risk   9.6   7.1  3 X Average Risk  23.4   11.0        Use the calculated Patient Ratio above and the CHD Risk Table to determine the patient's CHD Risk.        ATP III CLASSIFICATION (LDL):  <100     mg/dL   Optimal  100-129  mg/dL   Near or Above                    Optimal  130-159  mg/dL   Borderline  160-189  mg/dL   High  >190     mg/dL   Very High   Lactic acid, plasma     Status: Abnormal   Collection Time: 11/12/15  4:25 AM  Result Value Ref Range   Lactic Acid, Venous 2.2 (HH) 0.5 - 2.0 mmol/L    Comment: CRITICAL RESULT CALLED TO, READ BACK BY AND VERIFIED WITH: BARNHILL S,RN 11/12/15 0538 WAYK   CBC     Status: None   Collection Time: 11/12/15  4:40 AM  Result Value Ref Range   WBC 8.5 4.0 - 10.5 K/uL   RBC 5.48 4.22 - 5.81 MIL/uL   Hemoglobin 16.6 13.0 - 17.0 g/dL   HCT 50.1  39.0 - 52.0 %   MCV 91.4 78.0 - 100.0 fL   MCH 30.3 26.0 - 34.0 pg   MCHC 33.1 30.0 - 36.0 g/dL   RDW 13.2 11.5 - 15.5 %   Platelets 166 150 - 400 K/uL  Lactic acid, plasma  Status: Abnormal   Collection Time: 11/12/15  6:17 AM  Result Value Ref Range   Lactic Acid, Venous 2.1 (HH) 0.5 - 2.0 mmol/L    Comment: CRITICAL RESULT CALLED TO, READ BACK BY AND VERIFIED WITH: S.BARNHILL,RN 11/12/15 0652 BY BSLADE   Glucose, capillary     Status: Abnormal   Collection Time: 11/12/15  7:31 AM  Result Value Ref Range   Glucose-Capillary 131 (H) 65 - 99 mg/dL  Glucose, capillary     Status: Abnormal   Collection Time: 11/12/15 11:09 AM  Result Value Ref Range   Glucose-Capillary 106 (H) 65 - 99 mg/dL    Dg Chest 2 View  11/11/2015  CLINICAL DATA:  Centralized chest pain, shortness of breath, diaphoresis tonight. History of hypertension, hyperlipidemia. EXAM: CHEST  2 VIEW COMPARISON:  None available for comparison at time of study interpretation. FINDINGS: Cardiac silhouette is mildly enlarged, mediastinal silhouette is normal. Low inspiratory examination with bibasilar atelectasis. No pleural effusion or focal consolidation. Mildly elevated RIGHT hemidiaphragm. No pneumothorax. Soft tissue planes and included osseous structures are nonsuspicious. IMPRESSION: Mild cardiomegaly and bibasilar atelectasis. Electronically Signed   By: Elon Alas M.D.   On: 11/11/2015 05:57   Dg Abd 1 View  11/12/2015  CLINICAL DATA:  Acute onset of generalized abdominal pain and distention. Nausea and vomiting. Initial encounter. EXAM: ABDOMEN - 1 VIEW COMPARISON:  Abdominal radiograph performed 12/25/2014 FINDINGS: There is distention of small bowel loops up to 4.9 cm in maximal diameter, raising question for some degree of underlying bowel obstruction, though small bowel dysmotility might have a similar appearance. Mild residual air is seen within the colon. No free intra-abdominal air is seen, though  evaluation for free air is limited on supine views. The visualized osseous structures are grossly unremarkable. Contrast is seen within the bladder. IMPRESSION: Distention of the small bowel loops up to 4.9 cm maximal diameter, raising question for some degree of underlying bowel obstruction, though small bowel dysmotility might have a similar appearance. No free intra-abdominal air seen. Electronically Signed   By: Garald Balding M.D.   On: 11/12/2015 03:29   Ct Angio Chest Pe W/cm &/or Wo Cm  11/11/2015  CLINICAL DATA:  Short of breath and chest pain for 3 days EXAM: CT ANGIOGRAPHY CHEST WITH CONTRAST TECHNIQUE: Multidetector CT imaging of the chest was performed using the standard protocol during bolus administration of intravenous contrast. Multiplanar CT image reconstructions and MIPs were obtained to evaluate the vascular anatomy. CONTRAST:  168m OMNIPAQUE IOHEXOL 350 MG/ML SOLN COMPARISON:  None. FINDINGS: There are no filling defects in the pulmonary arterial tree to suggest acute pulmonary thromboembolism. No evidence of aortic dissection or intramural hematoma. Maximal diameter of the ascending aorta is 4.4 cm. No abnormal mediastinal adenopathy based on measurement criteria. The heart is enlarged. No pneumothorax.  No pleural effusion. Dependent atelectasis bilaterally.  No consolidation or lung mass. No vertebral compression deformity. Review of the MIP images confirms the above findings. IMPRESSION: No evidence of acute pulmonary thromboembolism. 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 This recommendation follows 2010 ACCF/AHA/AATS/ACR/ASA/SCA/SCAI/SIR/STS/SVM Guidelines for the Diagnosis and Management of Patients with Thoracic Aortic Disease. Circulation. 2010; 121: [id number] Signed   By: AMarybelle KillingsM.D.   On: 11/11/2015 19:16   Ct Abdomen Pelvis W Contrast  11/12/2015  CLINICAL DATA:  Abdominal  discomfort, nausea and vomiting beginning at 3 a.m. last night. History of prior small  bowel obstruction. Initial encounter. EXAM: CT ABDOMEN AND PELVIS WITH CONTRAST TECHNIQUE: Multidetector CT imaging of the abdomen and pelvis was performed using the standard protocol following bolus administration of intravenous contrast. CONTRAST:  100 cc Omnipaque 350. COMPARISON:  Single view of the abdomen earlier today. CT abdomen and pelvis 08/25/2015. FINDINGS: No pleural or pericardial effusion. NG tube is in place with the tip in the mid stomach in good position. Mild dependent atelectasis is noted. There are some air-fluid levels and small bowel dilatation of up to 4.4 cm. The distal ileum is completely decompressed. Transition point appears to be in the right lower quadrant where bowel loops appear adhesed together. A small amount of interloop free fluid is seen in the left abdomen. There is no pneumatosis, portal venous gas or free intraperitoneal air. The stomach, colon and appendix appear normal. The right kidney, spleen, adrenal glands and pancreas are unremarkable. A punctate nonobstructing stone is seen in the lower pole of the left kidney. The liver is low attenuating compatible with fatty infiltration. Hypoattenuating lesion in the right hepatic lobe measuring 1.6 cm on image 37 is unchanged and may be a cyst or hemangioma. No lymphadenopathy is identified. IMPRESSION: Findings compatible with early or partial small bowel obstruction likely due to adhesions. No CT evidence of bowel ischemia. Fatty infiltration of the liver. Punctate nonobstructing stone lower pole left kidney. Atherosclerosis. Electronically Signed   By: Inge Rise M.D.   On: 11/12/2015 11:16   Dg Chest Port 1 View  11/12/2015  CLINICAL DATA:  Mid chest pain.  Hypotension EXAM: PORTABLE CHEST 1 VIEW COMPARISON:  Yesterday FINDINGS: Apparent cardiomegaly and blunting of the lateral left costophrenic sulcus correlates with a large  mediastinal fat pad on CT from yesterday. Negative aortic and hilar contours. There is no edema, consolidation, effusion, or pneumothorax. There is a nasogastric which at least reaches the stomach. IMPRESSION: No evidence of active disease. Electronically Signed   By: Monte Fantasia M.D.   On: 11/12/2015 06:42    Review of Systems  Constitutional: Negative for fever, chills, weight loss, malaise/fatigue and diaphoresis.  Eyes: Negative for blurred vision, double vision, photophobia, pain, discharge and redness.  Respiratory: Negative for cough, hemoptysis, sputum production, shortness of breath and wheezing.   Cardiovascular: Negative for chest pain, palpitations, orthopnea, claudication and leg swelling.  Gastrointestinal: Positive for nausea, abdominal pain, diarrhea and constipation. Negative for vomiting, blood in stool and melena.  Genitourinary: Negative for dysuria, urgency, frequency, hematuria and flank pain.  Musculoskeletal: Negative for myalgias, back pain and joint pain.  Neurological: Negative for dizziness, tingling, tremors, sensory change, speech change, focal weakness, seizures, loss of consciousness and weakness.  Psychiatric/Behavioral: Negative for suicidal ideas and substance abuse.   Blood pressure 114/70, pulse 82, temperature 99.6 F (37.6 C), temperature source Oral, resp. rate 20, height 5' 10.5" (1.791 m), weight 120.8 kg (266 lb 5.1 oz), SpO2 92 %. Physical Exam  Constitutional: He is oriented to person, place, and time. He appears well-developed and well-nourished. No distress.  Cardiovascular: Normal rate, regular rhythm, normal heart sounds and intact distal pulses.  Exam reveals no gallop and no friction rub.   No murmur heard. Respiratory: Effort normal and breath sounds normal. No respiratory distress. He has no wheezes. He has no rales. He exhibits no tenderness.  GI: Soft. Bowel sounds are normal. He exhibits distension. He exhibits no mass. There is no  rebound and no guarding.  TTP RLQ without guarding.   Musculoskeletal: Normal range of  motion. He exhibits no edema or tenderness.  Neurological: He is alert and oriented to person, place, and time.  Skin: Skin is warm and dry. No rash noted. He is not diaphoretic. No erythema. No pallor.  Psychiatric: He has a normal mood and affect. His behavior is normal. Judgment and thought content normal.    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  Continue with NGT decompression, bowel rest, IVF and pain control.  Start SBO protocol.  No indications for urgent surgical indications.  He understands he may require surgery should he fail conservative management.  Thank you for the consult.  Will follow along.   Azucena Dart ANP-BC  11/12/2015, 1:45 PM

## 2015-11-12 NOTE — Plan of Care (Signed)
Problem: Phase II Progression Outcomes Goal: Anginal pain relieved Outcome: Completed/Met Date Met:  11/12/15 Patient has had no complaints of chest pain. Patient will alert RN if he develops chest pain. Goal: Stress Test if indicated Outcome: Progressing Patient scheduled to receive stress test on 11/12/15.

## 2015-11-12 NOTE — Progress Notes (Signed)
Subjective:  Major developments were development of SBO last night. Pt is NPO with NGT. Mild Abd pain. Denies CP  Objective:  Temp:  [98.5 F (36.9 C)-99.6 F (37.6 C)] 99.6 F (37.6 C) (03/17 0827) Pulse Rate:  [63-82] 82 (03/17 0827) Resp:  [15-25] 20 (03/17 0519) BP: (75-128)/(34-79) 114/70 mmHg (03/17 0827) SpO2:  [91 %-95 %] 92 % (03/17 0827) Weight:  [266 lb 5.1 oz (120.8 kg)] 266 lb 5.1 oz (120.8 kg) (03/17 0519) Weight change: -14 lb 11 oz (-6.661 kg)  Intake/Output from previous day: 03/16 0701 - 03/17 0700 In: 480 [P.O.:480] Out: 1000 [Urine:350; Emesis/NG output:650]  Intake/Output from this shift:    Physical Exam: General appearance: alert and mild distress Neck: no adenopathy, no carotid bruit, no JVD, supple, symmetrical, trachea midline and thyroid not enlarged, symmetric, no tenderness/mass/nodules Lungs: clear to auscultation bilaterally Heart: regular rate and rhythm, S1, S2 normal, no murmur, click, rub or gallop Extremities: extremities normal, atraumatic, no cyanosis or edema  Lab Results: Results for orders placed or performed during the hospital encounter of 11/11/15 (from the past 48 hour(s))  Basic metabolic panel     Status: Abnormal   Collection Time: 11/11/15  5:33 AM  Result Value Ref Range   Sodium 137 135 - 145 mmol/L   Potassium 3.5 3.5 - 5.1 mmol/L   Chloride 102 101 - 111 mmol/L   CO2 24 22 - 32 mmol/L   Glucose, Bld 113 (H) 65 - 99 mg/dL   BUN 18 6 - 20 mg/dL   Creatinine, Ser 0.75 0.61 - 1.24 mg/dL   Calcium 8.7 (L) 8.9 - 10.3 mg/dL   GFR calc non Af Amer >60 >60 mL/min   GFR calc Af Amer >60 >60 mL/min    Comment: (NOTE) The eGFR has been calculated using the CKD EPI equation. This calculation has not been validated in all clinical situations. eGFR's persistently <60 mL/min signify possible Chronic Kidney Disease.    Anion gap 11 5 - 15  CBC     Status: Abnormal   Collection Time: 11/11/15  5:33 AM  Result Value Ref  Range   WBC 7.4 4.0 - 10.5 K/uL   RBC 5.21 4.22 - 5.81 MIL/uL   Hemoglobin 16.2 13.0 - 17.0 g/dL   HCT 47.2 39.0 - 52.0 %   MCV 90.6 78.0 - 100.0 fL   MCH 31.1 26.0 - 34.0 pg   MCHC 34.3 30.0 - 36.0 g/dL   RDW 13.4 11.5 - 15.5 %   Platelets 148 (L) 150 - 400 K/uL  Hepatic function panel     Status: Abnormal   Collection Time: 11/11/15  5:33 AM  Result Value Ref Range   Total Protein 6.7 6.5 - 8.1 g/dL   Albumin 3.8 3.5 - 5.0 g/dL   AST 66 (H) 15 - 41 U/L   ALT 147 (H) 17 - 63 U/L   Alkaline Phosphatase 58 38 - 126 U/L   Total Bilirubin 1.4 (H) 0.3 - 1.2 mg/dL   Bilirubin, Direct 0.3 0.1 - 0.5 mg/dL   Indirect Bilirubin 1.1 (H) 0.3 - 0.9 mg/dL  Lipase, blood     Status: None   Collection Time: 11/11/15  5:33 AM  Result Value Ref Range   Lipase 25 11 - 51 U/L  D-dimer, quantitative (not at [facility])     Status: Abnormal   Collection Time: 11/11/15  5:33 AM  Result Value Ref Range   D-Dimer, Quant 2.65 (H) 0.00 -  0.50 ug/mL-FEU    Comment: (NOTE) At the manufacturer cut-off of 0.50 ug/mL FEU, this assay has been documented to exclude PE with a sensitivity and negative predictive value of 97 to 99%.  At this time, this assay has not been approved by the FDA to exclude DVT/VTE. Results should be correlated with clinical presentation.   I-stat troponin, ED (not at [facility])     Status: None   Collection Time: 11/11/15  5:57 AM  Result Value Ref Range   Troponin i, poc 0.00 0.00 - 0.08 ng/mL   Comment 3            Comment: Due to the release kinetics of cTnI, a negative result within the first hours of the onset of symptoms does not rule out myocardial infarction with certainty. If myocardial infarction is still suspected, repeat the test at appropriate intervals.   Troponin I     Status: None   Collection Time: 11/11/15 10:51 AM  Result Value Ref Range   Troponin I <0.03 <0.031 ng/mL    Comment:        NO INDICATION OF MYOCARDIAL INJURY.   CBC     Status: Abnormal    Collection Time: 11/11/15  1:03 PM  Result Value Ref Range   WBC 6.1 4.0 - 10.5 K/uL   RBC 4.79 4.22 - 5.81 MIL/uL   Hemoglobin 14.7 13.0 - 17.0 g/dL   HCT 43.9 39.0 - 52.0 %   MCV 91.6 78.0 - 100.0 fL   MCH 30.7 26.0 - 34.0 pg   MCHC 33.5 30.0 - 36.0 g/dL   RDW 13.3 11.5 - 15.5 %   Platelets 144 (L) 150 - 400 K/uL  Creatinine, serum     Status: None   Collection Time: 11/11/15  1:03 PM  Result Value Ref Range   Creatinine, Ser 0.79 0.61 - 1.24 mg/dL   GFR calc non Af Amer >60 >60 mL/min   GFR calc Af Amer >60 >60 mL/min    Comment: (NOTE) The eGFR has been calculated using the CKD EPI equation. This calculation has not been validated in all clinical situations. eGFR's persistently <60 mL/min signify possible Chronic Kidney Disease.   Troponin I     Status: None   Collection Time: 11/11/15  1:03 PM  Result Value Ref Range   Troponin I <0.03 <0.031 ng/mL    Comment:        NO INDICATION OF MYOCARDIAL INJURY.   Troponin I     Status: None   Collection Time: 11/11/15  6:36 PM  Result Value Ref Range   Troponin I <0.03 <0.031 ng/mL    Comment:        NO INDICATION OF MYOCARDIAL INJURY.   Troponin I     Status: None   Collection Time: 11/11/15 11:40 PM  Result Value Ref Range   Troponin I <0.03 <0.031 ng/mL    Comment:        NO INDICATION OF MYOCARDIAL INJURY.   Basic metabolic panel     Status: Abnormal   Collection Time: 11/11/15 11:40 PM  Result Value Ref Range   Sodium 138 135 - 145 mmol/L   Potassium 3.7 3.5 - 5.1 mmol/L   Chloride 99 (L) 101 - 111 mmol/L   CO2 28 22 - 32 mmol/L   Glucose, Bld 111 (H) 65 - 99 mg/dL   BUN 12 6 - 20 mg/dL   Creatinine, Ser 0.82 0.61 - 1.24 mg/dL   Calcium 8.8 (L)  8.9 - 10.3 mg/dL   GFR calc non Af Amer >60 >60 mL/min   GFR calc Af Amer >60 >60 mL/min    Comment: (NOTE) The eGFR has been calculated using the CKD EPI equation. This calculation has not been validated in all clinical situations. eGFR's persistently <60 mL/min  signify possible Chronic Kidney Disease.    Anion gap 11 5 - 15  Lipid panel     Status: Abnormal   Collection Time: 11/11/15 11:40 PM  Result Value Ref Range   Cholesterol 171 0 - 200 mg/dL   Triglycerides 91 <150 mg/dL   HDL 53 >40 mg/dL   Total CHOL/HDL Ratio 3.2 RATIO   VLDL 18 0 - 40 mg/dL   LDL Cholesterol 100 (H) 0 - 99 mg/dL    Comment:        Total Cholesterol/HDL:CHD Risk Coronary Heart Disease Risk Table                     Men   Women  1/2 Average Risk   3.4   3.3  Average Risk       5.0   4.4  2 X Average Risk   9.6   7.1  3 X Average Risk  23.4   11.0        Use the calculated Patient Ratio above and the CHD Risk Table to determine the patient's CHD Risk.        ATP III CLASSIFICATION (LDL):  <100     mg/dL   Optimal  100-129  mg/dL   Near or Above                    Optimal  130-159  mg/dL   Borderline  160-189  mg/dL   High  >190     mg/dL   Very High   Lactic acid, plasma     Status: Abnormal   Collection Time: 11/12/15  4:25 AM  Result Value Ref Range   Lactic Acid, Venous 2.2 (HH) 0.5 - 2.0 mmol/L    Comment: CRITICAL RESULT CALLED TO, READ BACK BY AND VERIFIED WITH: BARNHILL S,RN 11/12/15 0538 WAYK   CBC     Status: None   Collection Time: 11/12/15  4:40 AM  Result Value Ref Range   WBC 8.5 4.0 - 10.5 K/uL   RBC 5.48 4.22 - 5.81 MIL/uL   Hemoglobin 16.6 13.0 - 17.0 g/dL   HCT 50.1 39.0 - 52.0 %   MCV 91.4 78.0 - 100.0 fL   MCH 30.3 26.0 - 34.0 pg   MCHC 33.1 30.0 - 36.0 g/dL   RDW 13.2 11.5 - 15.5 %   Platelets 166 150 - 400 K/uL  Lactic acid, plasma     Status: Abnormal   Collection Time: 11/12/15  6:17 AM  Result Value Ref Range   Lactic Acid, Venous 2.1 (HH) 0.5 - 2.0 mmol/L    Comment: CRITICAL RESULT CALLED TO, READ BACK BY AND VERIFIED WITH: S.BARNHILL,RN 11/12/15 0652 BY BSLADE   Glucose, capillary     Status: Abnormal   Collection Time: 11/12/15  7:31 AM  Result Value Ref Range   Glucose-Capillary 131 (H) 65 - 99 mg/dL     Imaging: Imaging results have been reviewed ( I have personally reviewed)  Tele- NSR  Assessment/Plan:   1. Principal Problem: 2.   SBO (small bowel obstruction) ([place]) 3. Active Problems: 4.   Hypertension 5.   Chest pain 6.  Time Spent Directly with Patient:  20 minutes  Length of Stay:   No further CP. New development of SBO (He has had this in the past) confirmed cy CT and now with NGT. No CP. Will delay MV until GI process resolves. Enz neg. 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   Keith Vance 11/12/2015, 11:17 AM

## 2015-11-12 NOTE — Plan of Care (Addendum)
Called about patient complaining of abdominal distension and vomiting.  Hypoactive bowel sounds, distended abdomen and tympanic to percussion.  KUB ordered, will consider NGT pending results.   Addendum: KUB with distended loops of bowel, possible obstruction vs dysmotility.    - CT Abd/pelvis with oral and IV contrast - IM consult

## 2015-11-12 NOTE — Progress Notes (Signed)
Pt. has home CPAP for use, able to set up/ use on own.

## 2015-11-13 ENCOUNTER — Inpatient Hospital Stay (HOSPITAL_COMMUNITY): Payer: BLUE CROSS/BLUE SHIELD

## 2015-11-13 DIAGNOSIS — R072 Precordial pain: Principal | ICD-10-CM

## 2015-11-13 DIAGNOSIS — R079 Chest pain, unspecified: Secondary | ICD-10-CM

## 2015-11-13 DIAGNOSIS — R791 Abnormal coagulation profile: Secondary | ICD-10-CM

## 2015-11-13 LAB — HEMOGLOBIN A1C
Hgb A1c MFr Bld: 5.8 % — ABNORMAL HIGH (ref 4.8–5.6)
Mean Plasma Glucose: 120 mg/dL

## 2015-11-13 LAB — HEPATITIS PANEL, ACUTE
HCV Ab: 0.1 s/co ratio (ref 0.0–0.9)
Hep A IgM: NEGATIVE
Hep B C IgM: NEGATIVE
Hepatitis B Surface Ag: NEGATIVE

## 2015-11-13 LAB — ECHOCARDIOGRAM COMPLETE
Height: 70.5 in
Weight: 4244.8 oz

## 2015-11-13 LAB — GLUCOSE, CAPILLARY
Glucose-Capillary: 93 mg/dL (ref 65–99)
Glucose-Capillary: 91 mg/dL (ref 65–99)

## 2015-11-13 MED ORDER — HYDROCHLOROTHIAZIDE 12.5 MG PO CAPS
12.5000 mg | ORAL_CAPSULE | Freq: Every day | ORAL | Status: DC
  Administered 2015-11-13 – 2015-11-14 (×2): 12.5 mg via ORAL
  Filled 2015-11-13 (×2): qty 1

## 2015-11-13 MED ORDER — PANTOPRAZOLE SODIUM 40 MG PO TBEC
40.0000 mg | DELAYED_RELEASE_TABLET | Freq: Every day | ORAL | Status: DC
  Administered 2015-11-13 – 2015-11-14 (×2): 40 mg via ORAL
  Filled 2015-11-13 (×2): qty 1

## 2015-11-13 MED ORDER — MORPHINE SULFATE (PF) 2 MG/ML IV SOLN
1.0000 mg | Freq: Once | INTRAVENOUS | Status: AC
  Administered 2015-11-13: 1 mg via INTRAVENOUS
  Filled 2015-11-13: qty 1

## 2015-11-13 MED ORDER — ASPIRIN EC 81 MG PO TBEC
81.0000 mg | DELAYED_RELEASE_TABLET | Freq: Every day | ORAL | Status: DC
  Administered 2015-11-13 – 2015-11-14 (×2): 81 mg via ORAL
  Filled 2015-11-13 (×3): qty 1

## 2015-11-13 MED ORDER — LISINOPRIL 20 MG PO TABS
20.0000 mg | ORAL_TABLET | Freq: Every day | ORAL | Status: DC
  Administered 2015-11-13 – 2015-11-14 (×2): 20 mg via ORAL
  Filled 2015-11-13 (×2): qty 1

## 2015-11-13 NOTE — Progress Notes (Signed)
Subjective: Patient is in Nuclear Medicine for Cardiology study Two bowel movements recorded Films show contrast throughout colon to rectum - no sign of SBO  Objective: Vital signs in last 24 hours: Temp:  [98 F (36.7 C)-99.6 F (37.6 C)] 99.1 F (37.3 C) (03/18 0509) Pulse Rate:  [82-95] 95 (03/18 0509) Resp:  [18-20] 18 (03/18 0509) BP: (114-146)/(69-92) 146/92 mmHg (03/18 0509) SpO2:  [92 %-94 %] 93 % (03/18 0509) Weight:  [120.339 kg (265 lb 4.8 oz)] 120.339 kg (265 lb 4.8 oz) (03/18 0509) Last BM Date: 11/12/15 (patient having several small, loose BMs)  Intake/Output from previous day: 03/17 0701 - 03/18 0700 In: -  Out: 1078 [Urine:526; Emesis/NG output:550; Stool:2] Intake/Output this shift:    Deferred - patient off floor  Lab Results:   Recent Labs  11/11/15 1303 11/12/15 0440  WBC 6.1 8.5  HGB 14.7 16.6  HCT 43.9 50.1  PLT 144* 166   BMET  Recent Labs  11/11/15 0533 11/11/15 1303 11/11/15 2340  NA 137  --  138  K 3.5  --  3.7  CL 102  --  99*  CO2 24  --  28  GLUCOSE 113*  --  111*  BUN 18  --  12  CREATININE 0.75 0.79 0.82  CALCIUM 8.7*  --  8.8*   PT/INR No results for input(s): LABPROT, INR in the last 72 hours. ABG No results for input(s): PHART, HCO3 in the last 72 hours.  Invalid input(s): PCO2, PO2  Studies/Results: Dg Abd 1 View  11/12/2015  CLINICAL DATA:  Acute onset of generalized abdominal pain and distention. Nausea and vomiting. Initial encounter. EXAM: ABDOMEN - 1 VIEW COMPARISON:  Abdominal radiograph performed 12/25/2014 FINDINGS: There is distention of small bowel loops up to 4.9 cm in maximal diameter, raising question for some degree of underlying bowel obstruction, though small bowel dysmotility might have a similar appearance. Mild residual air is seen within the colon. No free intra-abdominal air is seen, though evaluation for free air is limited on supine views. The visualized osseous structures are grossly  unremarkable. Contrast is seen within the bladder. IMPRESSION: Distention of the small bowel loops up to 4.9 cm maximal diameter, raising question for some degree of underlying bowel obstruction, though small bowel dysmotility might have a similar appearance. No free intra-abdominal air seen. Electronically Signed   By: Roanna Raider M.D.   On: 11/12/2015 03:29   Ct Angio Chest Pe W/cm &/or Wo Cm  11/11/2015  CLINICAL DATA:  Short of breath and chest pain for 3 days EXAM: CT ANGIOGRAPHY CHEST WITH CONTRAST TECHNIQUE: Multidetector CT imaging of the chest was performed using the standard protocol during bolus administration of intravenous contrast. Multiplanar CT image reconstructions and MIPs were obtained to evaluate the vascular anatomy. CONTRAST:  OMNIPAQUE IOHEXOL 350 MG/ML SOLN COMPARISON:  None. FINDINGS: There are no filling defects in the pulmonary arterial tree to suggest acute pulmonary thromboembolism. No evidence of aortic dissection or intramural hematoma. Maximal diameter of the ascending aorta is 4.4 cm. No abnormal mediastinal adenopathy based on measurement criteria. The heart is enlarged. No pneumothorax.  No pleural effusion. Dependent atelectasis bilaterally.  No consolidation or lung mass. No vertebral compression deformity. Review of the MIP images confirms the above findings. IMPRESSION: No evidence of acute pulmonary thromboembolism. 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 This recommendation follows 2010 ACCF/AHA/AATS/ACR/ASA/SCA/SCAI/SIR/STS/SVM Guidelines for the Diagnosis and Management  of Patients with Thoracic Aortic Disease. Circulation. 2010; 121: [id number] Electronically Signed   By: Jolaine ClickArthur  Hoss M.D.   On: 11/11/2015 19:16   Ct Abdomen Pelvis W Contrast  11/12/2015  CLINICAL DATA:  Abdominal discomfort, nausea and vomiting beginning at 3 a.m. last night. History of prior small bowel  obstruction. Initial encounter. EXAM: CT ABDOMEN AND PELVIS WITH CONTRAST TECHNIQUE: Multidetector CT imaging of the abdomen and pelvis was performed using the standard protocol following bolus administration of intravenous contrast. CONTRAST:  100 cc Omnipaque 350. COMPARISON:  Single view of the abdomen earlier today. CT abdomen and pelvis 08/25/2015. FINDINGS: No pleural or pericardial effusion. NG tube is in place with the tip in the mid stomach in good position. Mild dependent atelectasis is noted. There are some air-fluid levels and small bowel dilatation of up to 4.4 cm. The distal ileum is completely decompressed. Transition point appears to be in the right lower quadrant where bowel loops appear adhesed together. A small amount of interloop free fluid is seen in the left abdomen. There is no pneumatosis, portal venous gas or free intraperitoneal air. The stomach, colon and appendix appear normal. The right kidney, spleen, adrenal glands and pancreas are unremarkable. A punctate nonobstructing stone is seen in the lower pole of the left kidney. The liver is low attenuating compatible with fatty infiltration. Hypoattenuating lesion in the right hepatic lobe measuring 1.6 cm on image 37 is unchanged and may be a cyst or hemangioma. No lymphadenopathy is identified. IMPRESSION: Findings compatible with early or partial small bowel obstruction likely due to adhesions. No CT evidence of bowel ischemia. Fatty infiltration of the liver. Punctate nonobstructing stone lower pole left kidney. Atherosclerosis. Electronically Signed   By: Drusilla Kannerhomas  Dalessio M.D.   On: 11/12/2015 11:16   Dg Chest Port 1 View  11/12/2015  CLINICAL DATA:  Mid chest pain.  Hypotension EXAM: PORTABLE CHEST 1 VIEW COMPARISON:  Yesterday FINDINGS: Apparent cardiomegaly and blunting of the lateral left costophrenic sulcus correlates with a large mediastinal fat pad on CT from yesterday. Negative aortic and hilar contours. There is no edema,  consolidation, effusion, or pneumothorax. There is a nasogastric which at least reaches the stomach. IMPRESSION: No evidence of active disease. Electronically Signed   By: Marnee SpringJonathon  Watts M.D.   On: 11/12/2015 06:42   Dg Abd Portable 1v-small Bowel Obstruction Protocol-initial, 8 Hr Delay  11/13/2015  CLINICAL DATA:  Follow-up small bowel obstruction. Subsequent encounter. EXAM: PORTABLE ABDOMEN - 1 VIEW COMPARISON:  CT of the abdomen and pelvis performed earlier today at 10:48 a.m. FINDINGS: Previously ingested contrast is now seen within the colon, extending to the level of the rectum. There is no evidence for bowel obstruction. A distended loop of small bowel at the right mid abdomen, measuring 4.5 cm in diameter, likely reflects mild small bowel dysmotility. No free intra-abdominal air is seen, though evaluation for free air is limited on a single supine view. No acute osseous abnormalities are identified. IMPRESSION: Previous ingested contrast noted within the colon, extending to the level of the rectum. No evidence for bowel obstruction. Single distended loop of small bowel at the right mid abdomen likely reflects mild small bowel dysmotility. No free intra-abdominal air seen. Electronically Signed   By: Roanna RaiderJeffery  Chang M.D.   On: 11/13/2015 00:08    Anti-infectives: Anti-infectives    None      Assessment/Plan: No sign of small bowel obstruction Would remove NG tube  Clear liquids; advance as tolerated Will sign off -  call us back if needed.  LOS: 1 day    Evalynne Locurto K. 11/13/2015

## 2015-11-13 NOTE — Progress Notes (Signed)
    Subjective:  Denies CP or dyspnea; abdominal pain better; having BMs   Objective:  Filed Vitals:   11/12/15 1535 11/12/15 2100 11/13/15 0243 11/13/15 0509  BP: 131/84 127/73 138/69 146/92  Pulse: 88 86  95  Temp: 98.4 F (36.9 C) 98 F (36.7 C)  99.1 F (37.3 C)  TempSrc: Oral Oral  Oral  Resp: 20 18  18   Height:      Weight:    265 lb 4.8 oz (120.339 kg)  SpO2: 94% 93%  93%    Intake/Output from previous day:  Intake/Output Summary (Last 24 hours) at 11/13/15 1009 Last data filed at 11/13/15 0500  Gross per 24 hour  Intake      0 ml  Output   1078 ml  Net  -1078 ml    Physical Exam: Physical exam: Well-developed well-nourished in no acute distress.  Skin is warm and dry.  HEENT is normal.  Neck is supple.  Chest is clear to auscultation with normal expansion.  Cardiovascular exam is regular rate and rhythm.  Abdominal exam nontender or distended. No masses palpated. Extremities show no edema. neuro grossly intact    Lab Results: Basic Metabolic Panel:  Recent Labs  40/98/1103/16/17 0533 11/11/15 1303 11/11/15 2340  NA 137  --  138  K 3.5  --  3.7  CL 102  --  99*  CO2 24  --  28  GLUCOSE 113*  --  111*  BUN 18  --  12  CREATININE 0.75 0.79 0.82  CALCIUM 8.7*  --  8.8*   CBC:  Recent Labs  11/11/15 1303 11/12/15 0440  WBC 6.1 8.5  HGB 14.7 16.6  HCT 43.9 50.1  MCV 91.6 91.4  PLT 144* 166   Cardiac Enzymes:  Recent Labs  11/11/15 1303 11/11/15 1836 11/11/15 2340  TROPONINI <0.03 <0.03 <0.03     Assessment/Plan:  1 Chest pain-patient has ruled out. Plan nuclear study tomorrow. If negative he will be discharged. 2 SBO-Improving. Patient now drinking liquids. Surgery has signed off. 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 4 Hypertension-resume lisinopril HCT. Blood pressure has been borderline. Hold amlodipine for now.  Olga MillersBrian Crenshaw 11/13/2015, 10:09 AM

## 2015-11-13 NOTE — Progress Notes (Signed)
[facility]                                                                              Patient Demographics  Keith Vance, is a 55 y.o. male, DOB - 1961-08-26, ZOX:[id number]  Admit date - 11/11/2015   Admitting Physician Runell GessJonathan J Berry, MD  Outpatient Primary MD for the patient is [facility]  LOS - 1  days    Chief Complaint  Patient presents with  . Chest Pain       Brief HPI  Patient is a 55 year old male with hypertension, hyperlipidemia, sleep apnea, prior history of SBO, hernia repair surgery presented to ED with chest pain. Patient was admitted by cardiology and planning to have stress test done today however around 3 AM patient started developing abdominal discomfort, distention with nausea and vomiting. Abdominal x-ray was concerning for ileus versus obstruction. Patient had NG tube placed and 1 L suctioned out. Internal medicine consult was obtained.  Assessment & Plan    Principal Problem:   SBO (small bowel obstruction) (HCC): Prior history of SBO, adhesions, prior hernia repair surgery - Significantly improved, having multiple bowel movements last night, - Abdominal x-ray Showed improved SBO - NGT removed, started on clear liquid diet, advance as tolerated   Active Problems:  Lactic acidosis likely due to #1, dehydration - Continue IV fluid hydration - Hold HCTZ     Hypertension - Currently BP stable, hold oral antihypertensives - Continue IV hydralazine as needed with parameters    Chest pain at rest - Patient has been seen by cardiology, planning stress test in am - CTA negative for pulmonary embolism  Ascending aortic aneurysm incidentally seen on CT angiogram chest Aneurysmal dilatation in of the ascending aorta measures up to 4.4 cm Recommend annual imaging followup by CTA or MRA  Code Status: Full CODE STATUS   Family Communication: Discussed in detail with the patient, all imaging results, lab results explained to  the patient   Disposition Plan:   Time Spent in minutes   25 minutes  Procedures  NGT   Consults   Cardiology  surgery  DVT Prophylaxis   heparin  Medications  Scheduled Meds: . aspirin EC  81 mg Oral Daily  . heparin  5,000 Units Subcutaneous 3 times per day  . hydrochlorothiazide  12.5 mg Oral Daily  . lisinopril  20 mg Oral Daily  . pantoprazole  40 mg Oral Q0600   Continuous Infusions:   PRN Meds:.acetaminophen, hydrALAZINE, iohexol, nitroGLYCERIN, ondansetron (ZOFRAN) IV   Antibiotics   Anti-infectives    None        Subjective:   Keith KittenRoy Dech was seen and examined today.  Feels a lot better today, had multiple bowel movement last night, wants to get his NG out. No fevers or chills.  Patient denies dizziness, chest pain, shortness of breath, new weakness, numbess, tingling.  Objective:   Filed Vitals:   11/12/15 1535 11/12/15 2100 11/13/15 0243 11/13/15 0509  BP: 131/84 127/73 138/69 146/92  Pulse: 88 86  95  Temp: 98.4 F (36.9 C) 98 F (36.7 C)  99.1 F (37.3 C)  TempSrc:  Oral Oral  Oral  Resp: Height:      Weight:    120.339 kg (265 lb 4.8 oz)  SpO2: 94% 93%  93%    Intake/Output Summary (Last 24 hours) at 11/13/15 1230 Last data filed at 11/13/15 0500  Gross per 24 hour  Intake      0 ml  Output   1078 ml  Net  -1078 ml     Wt Readings from Last 3 Encounters:  11/13/15 120.339 kg (265 lb 4.8 oz)  08/25/15 128.731 kg (283 lb 12.8 oz)  02/27/15 121.11 kg (267 lb)     Exam  General: Alert and oriented x 3, NAD  HEENT:  PERRLA, EOMI, Anicteric Sclera, mucous membranes moist.   Neck: Supple, no JVD, no masses  CVS: S1 S2 auscultated, no rubs, murmurs or gallops. Regular rate and rhythm.  Respiratory: Clear to auscultation bilaterally, no wheezing, rales or rhonchi  Abdomen: Soft, distended, hypoactive bowel sounds, no significant tenderness   Ext: no cyanosis clubbing or edema  Neuro: AAOx3, Cr N's II- XII.  Strength 5/5 upper and lower extremities bilaterally  Skin: No rashes  Psych: Normal affect and demeanor, alert and oriented x3    Data Reviewed:  I have personally reviewed following labs and imaging studies  Micro Results No results found for this or any previous visit (from the past 240 hour(s)).  Radiology Reports Dg Chest 2 View  11/11/2015  CLINICAL DATA:  Centralized chest pain, shortness of breath, diaphoresis tonight. History of hypertension, hyperlipidemia. EXAM: CHEST  2 VIEW COMPARISON:  None available for comparison at time of study interpretation. FINDINGS: Cardiac silhouette is mildly enlarged, mediastinal silhouette is normal. Low inspiratory examination with bibasilar atelectasis. No pleural effusion or focal consolidation. Mildly elevated RIGHT hemidiaphragm. No pneumothorax. Soft tissue planes and included osseous structures are nonsuspicious. IMPRESSION: Mild cardiomegaly and bibasilar atelectasis. Electronically Signed   By: Awilda Metro M.D.   On: 11/11/2015 05:57   Dg Abd 1 View  11/12/2015  CLINICAL DATA:  Acute onset of generalized abdominal pain and distention. Nausea and vomiting. Initial encounter. EXAM: ABDOMEN - 1 VIEW COMPARISON:  Abdominal radiograph performed 12/25/2014 FINDINGS: There is distention of small bowel loops up to 4.9 cm in maximal diameter, raising question for some degree of underlying bowel obstruction, though small bowel dysmotility might have a similar appearance. Mild residual air is seen within the colon. No free intra-abdominal air is seen, though evaluation for free air is limited on supine views. The visualized osseous structures are grossly unremarkable. Contrast is seen within the bladder. IMPRESSION: Distention of the small bowel loops up to 4.9 cm maximal diameter, raising question for some degree of underlying bowel obstruction, though small bowel dysmotility might have a similar appearance. No free intra-abdominal air seen.  Electronically Signed   By: Roanna Raider M.D.   On: 11/12/2015 03:29   Ct Angio Chest Pe W/cm &/or Wo Cm  11/11/2015  CLINICAL DATA:  Short of breath and chest pain for 3 days EXAM: CT ANGIOGRAPHY CHEST WITH CONTRAST TECHNIQUE: Multidetector CT imaging of the chest was performed using the standard protocol during bolus administration of intravenous contrast. Multiplanar CT image reconstructions and MIPs were obtained to evaluate the vascular anatomy. CONTRAST:  OMNIPAQUE IOHEXOL 350 MG/ML SOLN COMPARISON:  None. FINDINGS: There are no filling defects in the pulmonary arterial tree to suggest acute pulmonary thromboembolism. No evidence of aortic dissection or intramural hematoma. Maximal diameter of the ascending  aorta is 4.4 cm. No abnormal mediastinal adenopathy based on measurement criteria. The heart is enlarged. No pneumothorax.  No pleural effusion. Dependent atelectasis bilaterally.  No consolidation or lung mass. No vertebral compression deformity. Review of the MIP images confirms the above findings. IMPRESSION: No evidence of acute pulmonary thromboembolism. 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 This recommendation follows 2010 ACCF/AHA/AATS/ACR/ASA/SCA/SCAI/SIR/STS/SVM Guidelines for the Diagnosis and Management of Patients with Thoracic Aortic Disease. Circulation. 2010; 121: W098-J191 Electronically Signed   By: Jolaine Click M.D.   On: 11/11/2015 19:16   Ct Abdomen Pelvis W Contrast  11/12/2015  CLINICAL DATA:  Abdominal discomfort, nausea and vomiting beginning at 3 a.m. last night. History of prior small bowel obstruction. Initial encounter. EXAM: CT ABDOMEN AND PELVIS WITH CONTRAST TECHNIQUE: Multidetector CT imaging of the abdomen and pelvis was performed using the standard protocol following bolus administration of intravenous contrast. CONTRAST:  100 cc Omnipaque 350. COMPARISON:  Single view of the  abdomen earlier today. CT abdomen and pelvis 08/25/2015. FINDINGS: No pleural or pericardial effusion. NG tube is in place with the tip in the mid stomach in good position. Mild dependent atelectasis is noted. There are some air-fluid levels and small bowel dilatation of up to 4.4 cm. The distal ileum is completely decompressed. Transition point appears to be in the right lower quadrant where bowel loops appear adhesed together. A small amount of interloop free fluid is seen in the left abdomen. There is no pneumatosis, portal venous gas or free intraperitoneal air. The stomach, colon and appendix appear normal. The right kidney, spleen, adrenal glands and pancreas are unremarkable. A punctate nonobstructing stone is seen in the lower pole of the left kidney. The liver is low attenuating compatible with fatty infiltration. Hypoattenuating lesion in the right hepatic lobe measuring 1.6 cm on image 37 is unchanged and may be a cyst or hemangioma. No lymphadenopathy is identified. IMPRESSION: Findings compatible with early or partial small bowel obstruction likely due to adhesions. No CT evidence of bowel ischemia. Fatty infiltration of the liver. Punctate nonobstructing stone lower pole left kidney. Atherosclerosis. Electronically Signed   By: Drusilla Kanner M.D.   On: 11/12/2015 11:16   Dg Chest Port 1 View  11/12/2015  CLINICAL DATA:  Mid chest pain.  Hypotension EXAM: PORTABLE CHEST 1 VIEW COMPARISON:  Yesterday FINDINGS: Apparent cardiomegaly and blunting of the lateral left costophrenic sulcus correlates with a large mediastinal fat pad on CT from yesterday. Negative aortic and hilar contours. There is no edema, consolidation, effusion, or pneumothorax. There is a nasogastric which at least reaches the stomach. IMPRESSION: No evidence of active disease. Electronically Signed   By: Marnee Spring M.D.   On: 11/12/2015 06:42   Dg Abd 2 Views  11/13/2015  CLINICAL DATA:  Small bowel obstruction EXAM:  ABDOMEN - 2 VIEW COMPARISON:  Abdominal radiograph from 1 day prior FINDINGS: Enteric tube terminates in the distal stomach. There are a few residual mildly dilated small bowel loops measuring up to 3.5 cm diameter, significantly decreased. There is retained oral contrast throughout the colon. No evidence of pneumatosis or pneumoperitoneum. No pathologic soft tissue calcifications. IMPRESSION: Significantly radiographically improved small-bowel obstruction. Electronically Signed   By: Delbert Phenix M.D.   On: 11/13/2015 10:54   Dg Abd Portable 1v-small Bowel Obstruction Protocol-initial, 8 Hr Delay  11/13/2015  CLINICAL DATA:  Follow-up small bowel obstruction. Subsequent encounter. EXAM: PORTABLE ABDOMEN - 1  VIEW COMPARISON:  CT of the abdomen and pelvis performed earlier today at 10:48 a.m. FINDINGS: Previously ingested contrast is now seen within the colon, extending to the level of the rectum. There is no evidence for bowel obstruction. A distended loop of small bowel at the right mid abdomen, measuring 4.5 cm in diameter, likely reflects mild small bowel dysmotility. No free intra-abdominal air is seen, though evaluation for free air is limited on a single supine view. No acute osseous abnormalities are identified. IMPRESSION: Previous ingested contrast noted within the colon, extending to the level of the rectum. No evidence for bowel obstruction. Single distended loop of small bowel at the right mid abdomen likely reflects mild small bowel dysmotility. No free intra-abdominal air seen. Electronically Signed   By: Roanna Raider M.D.   On: 11/13/2015 00:08    CBC  Recent Labs Lab 11/11/15 0533 11/11/15 1303 11/12/15 0440  WBC 7.4 6.1 8.5  HGB 16.2 14.7 16.6  HCT 47.2 43.9 50.1  PLT 148* 144* 166  MCV 90.6 91.6 91.4  MCH 31.1 30.7 30.3  MCHC 34.3 33.5 33.1  RDW 13.4 13.3 13.2    Chemistries   Recent Labs Lab 11/11/15 0533 11/11/15 1303 11/11/15 2340  NA 137  --  138  K 3.5  --  3.7   CL 102  --  99*  CO2 24  --  28  GLUCOSE 113*  --  111*  BUN 18  --  12  CREATININE 0.75 0.79 0.82  CALCIUM 8.7*  --  8.8*  AST 66*  --   --   ALT 147*  --   --   ALKPHOS 58  --   --   BILITOT 1.4*  --   --    ------------------------------------------------------------------------------------------------------------------ estimated creatinine clearance is 134.9 mL/min (by C-G formula based on Cr of 0.82). ------------------------------------------------------------------------------------------------------------------  Recent Labs  11/11/15 2340  HGBA1C 5.8*   ------------------------------------------------------------------------------------------------------------------  Recent Labs  11/11/15 2340  CHOL 171  HDL 53  LDLCALC 100*  TRIG 91  CHOLHDL 3.2   ------------------------------------------------------------------------------------------------------------------ No results for input(s): TSH, T4TOTAL, T3FREE, THYROIDAB in the last 72 hours.  Invalid input(s): FREET3 ------------------------------------------------------------------------------------------------------------------ No results for input(s): VITAMINB12, FOLATE, FERRITIN, TIBC, IRON, RETICCTPCT in the last 72 hours.  Coagulation profile No results for input(s): INR, PROTIME in the last 168 hours.   Recent Labs  11/11/15 0533  DDIMER 2.65*    Cardiac Enzymes  Recent Labs Lab 11/11/15 1303 11/11/15 1836 11/11/15 2340  TROPONINI <0.03 <0.03 <0.03   ------------------------------------------------------------------------------------------------------------------ Invalid input(s): POCBNP   Recent Labs  11/12/15 0731 11/12/15 1109 11/12/15 1626 11/12/15 2014 11/12/15 2348 11/13/15 1148  GLUCAP 131* 106* 97 82 90 93     Afra Tricarico M.D. [facility] 11/13/2015, 12:30 PM  Pager: [phone] Between 7am to 7pm - call Pager - [phone]  After 7pm go to [email or web address] -  password [email or web address]  Call night coverage person covering after 7pm

## 2015-11-14 ENCOUNTER — Encounter: Payer: Self-pay | Admitting: Physician Assistant

## 2015-11-14 ENCOUNTER — Encounter (HOSPITAL_COMMUNITY): Payer: Self-pay | Admitting: Nurse Practitioner

## 2015-11-14 ENCOUNTER — Inpatient Hospital Stay (HOSPITAL_COMMUNITY): Payer: BLUE CROSS/BLUE SHIELD

## 2015-11-14 DIAGNOSIS — I10 Essential (primary) hypertension: Secondary | ICD-10-CM

## 2015-11-14 DIAGNOSIS — R0789 Other chest pain: Secondary | ICD-10-CM

## 2015-11-14 DIAGNOSIS — R079 Chest pain, unspecified: Secondary | ICD-10-CM

## 2015-11-14 LAB — BASIC METABOLIC PANEL
Anion gap: 6 (ref 5–15)
BUN: 6 mg/dL (ref 6–20)
CO2: 29 mmol/L (ref 22–32)
Calcium: 8.6 mg/dL — ABNORMAL LOW (ref 8.9–10.3)
Chloride: 103 mmol/L (ref 101–111)
Creatinine, Ser: 0.82 mg/dL (ref 0.61–1.24)
GFR calc Af Amer: >60 mL/min (ref >60)
GFR calc non Af Amer: >60 mL/min (ref >60)
Glucose, Bld: 105 mg/dL — ABNORMAL HIGH (ref 65–99)
Potassium: 4.1 mmol/L (ref 3.5–5.1)
Sodium: 138 mmol/L (ref 135–145)

## 2015-11-14 LAB — HEPATIC FUNCTION PANEL
ALT: 94 U/L — ABNORMAL HIGH (ref 17–63)
AST: 49 U/L — ABNORMAL HIGH (ref 15–41)
Albumin: 3.1 g/dL — ABNORMAL LOW (ref 3.5–5.0)
Alkaline Phosphatase: 47 U/L (ref 38–126)
Bilirubin, Direct: 0.3 mg/dL (ref 0.1–0.5)
Indirect Bilirubin: 0.6 mg/dL (ref 0.3–0.9)
Total Bilirubin: 0.9 mg/dL (ref 0.3–1.2)
Total Protein: 6.1 g/dL — ABNORMAL LOW (ref 6.5–8.1)

## 2015-11-14 LAB — NM MYOCAR MULTI W/SPECT W/WALL MOTION / EF
Exercise duration (min): 5 min
Rest HR: 65 {beats}/min

## 2015-11-14 MED ORDER — REGADENOSON 0.4 MG/5ML IV SOLN
0.4000 mg | Freq: Once | INTRAVENOUS | Status: AC
  Administered 2015-11-14: 0.4 mg via INTRAVENOUS

## 2015-11-14 MED ORDER — TECHNETIUM TC 99M SESTAMIBI - CARDIOLITE: 30.0000 | Freq: Once | INTRAVENOUS | Status: DC | PRN

## 2015-11-14 MED ORDER — TECHNETIUM TC 99M SESTAMIBI - CARDIOLITE
30.0000 | Freq: Once | INTRAVENOUS | Status: AC | PRN
  Administered 2015-11-14: 12:00:00 30 via INTRAVENOUS

## 2015-11-14 MED ORDER — TECHNETIUM TC 99M SESTAMIBI GENERIC - CARDIOLITE: 10.0000 | Freq: Once | INTRAVENOUS | Status: DC | PRN

## 2015-11-14 MED ORDER — ASPIRIN 81 MG PO TBEC: 81.0000 mg | DELAYED_RELEASE_TABLET | Freq: Every day | ORAL | Status: DC

## 2015-11-14 MED ORDER — REGADENOSON 0.4 MG/5ML IV SOLN
INTRAVENOUS | Status: AC
  Filled 2015-11-14: qty 5

## 2015-11-14 MED ORDER — TECHNETIUM TC 99M SESTAMIBI GENERIC - CARDIOLITE
10.0000 | Freq: Once | INTRAVENOUS | Status: AC | PRN
  Administered 2015-11-14: 10 via INTRAVENOUS

## 2015-11-14 NOTE — Discharge Summary (Signed)
Discharge Summary    Patient ID: Keith Vance,  MRN: [id number], DOB/AGE: [birth year] 55 y.o.  Admit date: 11/11/2015 Discharge date: 11/14/2015  Primary Care Provider: [facility] Primary Cardiologist: Erlene QuanJ. Berry, MD   Discharge Diagnoses    Principal Problem:   Midsternal chest pain  **Lexiscan Cardiolite this admission demonstrated diaphragmatic attenuation, no ischemia, EF 53% (Low Risk) Active Problems:   SBO (small bowel obstruction) (HCC)   Abdominal distension   Essential hypertension   Lactic acidosis   Dehydration  Allergies No Known Allergies  Diagnostic Studies/Procedures    2D Echocardiogram 3.18.2017  Study Conclusions   - Left ventricle: The cavity size was normal. Wall thickness was   increased in a pattern of mild LVH. Systolic function was normal.   The estimated ejection fraction was in the range of 60% to 65%.   Wall motion was normal; there were no regional wall motion   abnormalities. Doppler parameters are consistent with abnormal   left ventricular relaxation (grade 1 diastolic dysfunction). - Aortic valve: There was no stenosis. - Aorta: Mildly dilated ascending aorta and aortic root. Aortic   root dimension: 39 mm (ED). Ascending aortic diameter: 40 mm (S). - Mitral valve: There was trivial regurgitation. - Right ventricle: The cavity size was normal. Systolic function   was normal. - Tricuspid valve: Peak RV-RA gradient (S): 39 mm Hg. - Pulmonary arteries: PA peak pressure: 42 mm Hg (S). _____________   Eugenie BirksLexiscan Cardiolite 3.19.2017  1. No evidence for pharmacologically induced ischemia. Decreased uptake along the inferior wall may represent diaphragmatic attenuation. 2. Normal left ventricular wall motion. 3. Left ventricular ejection fraction is 53%. 4. Low-risk stress test findings*. _____________   History of Present Illness     55 y/o ? with a history of HTN, HL, and morbid obeisty.  He was in his usual state of  health until the morning of 11/11/2015, when he had acute onset of right sided chest and back pain associated with diaphoresis and dyspnea.  Symptoms lasted approximately 20 minutes and resolved spontaneously.  Later in the morning, [facility] where troponin was normal and ECG was non-acute.  His D dimer was elevated and CTA of the chest was performed and was negative for PE but incidentally showed aneurysmal dilation of the ascending aorta of 4.4 cm (rec annual f/u).  He was admitted for further evaluation.  Hospital Course     Consultants: Internal Medicine; General Surgery   Patient ruled out for myocardial infarction. Echo demonstrated normal LVF, mild diastolic dysfunction, mildly dilated aortic root and ascending aorta, PASP 42 mmHg.  Initially, the plan was to perform stress testing.  However, on the evening following admission, he complained of abdominal distension and pain.  KUB was performed and showed distention of the small bowel loops up to 4.9 cm maximal diameter, raising question for some degree of underlying bowel obstruction.  An NGT was placed and CT was performed and revealed findings compatible with early or partial small bowel obstruction likely due to adhesions. No CT evidence of bowel ischemia. Fatty infiltration of the liver. Punctate nonobstructing stone lower pole left kidney.  LFT's and lactate were found to be elevated.  He was seen by both internal medicine and general surgery and was felt that SBO was most likely secondary to adhesions.  Bowel rest, IVF, pain control, and NGT decompression were recommended.  Lactate elevation was felt to be secondary to dehydration and home dose of HCTZ  was held.  By 3/18, he was feeling much better and was having bowel movements.  His NGT was discontinued.  LFTs today are improved.  Following recovery from SBO symptoms, he was felt to be stable for stress testing.  This was performed this morning and showed diaphragmatic  attenuation, no ischemia and normal EF.  This study was felt to be low risk.  He was evaluated by Dr. Olga Millers earlier in the day and felt to be ready for DC as long as his nuclear stress test was negative.  He is DC to home in improved condition.  His BP is increased and his usual home medications (Lisinopril/HCTZ and Amlodipine) will be resumed at DC.   Consider adding a beta-blocker at follow up.     _____________  Discharge Vitals Blood pressure 146/97, pulse 73, temperature 98.3 F (36.8 C), temperature source Oral, resp. rate 18, height 5' 10.5" (1.791 m), weight 263 lb 9.6 oz (119.568 kg), SpO2 98 %.  Filed Weights   11/12/15 0519 11/13/15 0509 11/14/15 0600  Weight: 266 lb 5.1 oz (120.8 kg) 265 lb 4.8 oz (120.339 kg) 263 lb 9.6 oz (119.568 kg)    Labs & Radiologic Studies    CBC  Recent Labs  11/12/15 0440  WBC 8.5  HGB 16.6  HCT 50.1  MCV 91.4  PLT 166   Basic Metabolic Panel  Recent Labs  11/11/15 2340 11/14/15 0518  NA 138 138  K 3.7 4.1  CL 99* 103  CO2 28 29  GLUCOSE 111* 105*  BUN 12 6  CREATININE 0.82 0.82  CALCIUM 8.8* 8.6*   Lab Results  Component Value Date   ALT 94* 11/14/2015   AST 49* 11/14/2015   ALKPHOS 47 11/14/2015   BILITOT 0.9 11/14/2015    Cardiac Enzymes  Recent Labs  11/11/15 1836 11/11/15 2340  TROPONINI <0.03 <0.03   Hemoglobin A1C  Recent Labs  11/11/15 2340  HGBA1C 5.8*   Fasting Lipid Panel  Recent Labs  11/11/15 2340  CHOL 171  HDL 53  LDLCALC 100*  TRIG 91  CHOLHDL 3.2  _____________   Dg Abd 1 View  11/12/2015  CLINICAL DATA:  Acute onset of generalized abdominal pain and distention. Nausea and vomiting. Initial encounter. EXAM: ABDOMEN - 1 VIEW IMPRESSION: Distention of the small bowel loops up to 4.9 cm maximal diameter, raising question for some degree of underlying bowel obstruction, though small bowel dysmotility might have a similar appearance. No free intra-abdominal air seen. Electronically  Signed   By: Roanna Raider M.D.   On: 11/12/2015 03:29   Ct Angio Chest Pe W/cm &/or Wo Cm  11/11/2015  CLINICAL DATA:  Short of breath and chest pain for 3 days EXAM: CT ANGIOGRAPHY CHEST WITH CONTRAST  IMPRESSION: No evidence of acute pulmonary thromboembolism. 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 This recommendation follows 2010 ACCF/AHA/AATS/ACR/ASA/SCA/SCAI/SIR/STS/SVM Guidelines for the Diagnosis and Management of Patients with Thoracic Aortic Disease. Circulation. 2010; 121: W119-J478 Electronically Signed   By: Jolaine Click M.D.   On: 11/11/2015 19:16   Ct Abdomen Pelvis W Contrast  11/12/2015  CLINICAL DATA:  Abdominal discomfort, nausea and vomiting beginning at 3 a.m. last night. History of prior small bowel obstruction. Initial encounter. EXAM: CT ABDOMEN AND PELVIS WITH CONTRAST IMPRESSION: Findings compatible with early or partial small bowel obstruction likely due to adhesions. No CT evidence of bowel ischemia. Fatty infiltration of the  liver. Punctate nonobstructing stone lower pole left kidney. Atherosclerosis. Electronically Signed   By: Drusilla Kanner M.D.   On: 11/12/2015 11:16   Dg Abd 2 Views  11/13/2015  CLINICAL DATA:  Small bowel obstruction EXAM: ABDOMEN - 2 VIEW IMPRESSION: Significantly radiographically improved small-bowel obstruction. Electronically Signed   By: Delbert Phenix M.D.   On: 11/13/2015 10:54   Dg Abd Portable 1v-small Bowel Obstruction Protocol-initial, 8 Hr Delay  11/13/2015  CLINICAL DATA:  Follow-up small bowel obstruction. Subsequent encounter. EXAM: PORTABLE ABDOMEN - 1 VIEW  IMPRESSION: Previous ingested contrast noted within the colon, extending to the level of the rectum. No evidence for bowel obstruction. Single distended loop of small bowel at the right mid abdomen likely reflects mild small bowel dysmotility. No free intra-abdominal air seen. Electronically Signed   By:  Roanna Raider M.D.   On: 11/13/2015 00:08    Disposition   Pt is being discharged home today in good condition.  Follow-up Plans & Appointments    Follow-up Information    Follow up with [facility] In 1 week.   Specialty:  Family Medicine   Why:  please call for follow up   Contact information:   [address] [place] [address] [phone]       Follow up with Nanetta Batty, MD In 1 month.   Specialties:  Cardiology, Radiology   Why:  The office will call to arrange a follow up with Dr. Allyson Sabal in the next 3-4 weeks.   Contact information:   [address] [place] [address] [phone]      Discharge Instructions    Diet - low sodium heart healthy    Complete by:  As directed      Increase activity slowly    Complete by:  As directed      No wound care    Complete by:  As directed      Other Restrictions    Complete by:  As directed   Return to work 11/22/15.          Discharge Medications   Current Discharge Medication List    START taking these medications   Details  aspirin EC 81 MG EC tablet Take 1 tablet (81 mg total) by mouth daily.      CONTINUE these medications which have NOT CHANGED   Details  amLODipine (NORVASC) 10 MG tablet Take 10 mg by mouth daily.    lisinopril-hydrochlorothiazide (PRINZIDE,ZESTORETIC) 20-12.5 MG tablet Take 1 tablet by mouth daily.    omega-3 acid ethyl esters (LOVAZA) 1 g capsule Take 2 g by mouth daily.    Red Yeast Rice Extract (RED YEAST RICE PO) Take 1 tablet by mouth daily.    traMADol (ULTRAM) 50 MG tablet Take 50 mg by mouth every 6 (six) hours as needed for moderate pain.         Outstanding Labs/Studies   Follow up Chest CTA in 2016/11/02.  Duration of Discharge Encounter   Greater than 30 minutes including physician time.  Signed, Tereso Newcomer, PA-C  11/14/2015, 4:36 PM

## 2015-11-14 NOTE — Progress Notes (Signed)
    Subjective:  Denies CP or dyspnea; abdominal pain resolved; having BMs   Objective:  Filed Vitals:   11/13/15 0509 11/13/15 1500 11/13/15 2100 11/14/15 0600  BP: 146/92 153/93 135/90 146/97  Pulse: 95 72 70   Temp: 99.1 F (37.3 C) 99.5 F (37.5 C) 97.8 F (36.6 C) 98.2 F (36.8 C)  TempSrc: Oral Oral  Oral  Resp: 18 16 16 16   Height:      Weight: 265 lb 4.8 oz (120.339 kg)   263 lb 9.6 oz (119.568 kg)  SpO2: 93% 96% 95% 95%    Intake/Output from previous day:  Intake/Output Summary (Last 24 hours) at 11/14/15 0820 Last data filed at 11/13/15 2000  Gross per 24 hour  Intake    480 ml  Output      0 ml  Net    480 ml    Physical Exam: Physical exam: Well-developed well-nourished in no acute distress.  Skin is warm and dry.  HEENT is normal.  Neck is supple.  Chest is clear to auscultation with normal expansion.  Cardiovascular exam is regular rate and rhythm.  Abdominal exam nontender or distended. No masses palpated. Extremities show no edema. neuro grossly intact    Lab Results: Basic Metabolic Panel:  Recent Labs  65/78/4603/16/17 2340 11/14/15 0518  NA 138 138  K 3.7 4.1  CL 99* 103  CO2 28 29  GLUCOSE 111* 105*  BUN 12 6  CREATININE 0.82 0.82  CALCIUM 8.8* 8.6*   CBC:  Recent Labs  11/11/15 1303 11/12/15 0440  WBC 6.1 8.5  HGB 14.7 16.6  HCT 43.9 50.1  MCV 91.6 91.4  PLT 144* 166   Cardiac Enzymes:  Recent Labs  11/11/15 1303 11/11/15 1836 11/11/15 2340  TROPONINI <0.03 <0.03 <0.03     Assessment/Plan:  1 Chest pain-patient has ruled out. Plan nuclear study today. If negative he will be discharged. 2 SBO-Improving. Patient now tolerating po intake. 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 4 Hypertension-continue lisinopril HCT. Watch BP and resume norvasc if needed. 5 Elevated LFTs-repeat Pt can be DCed if nuclear study negative and FU Dr Allyson SabalBerry.  Olga MillersBrian Cleona Vance 11/14/2015, 8:20 AM

## 2015-11-14 NOTE — Discharge Instructions (Signed)
**  PLEASE REMEMBER TO BRING ALL OF YOUR MEDICATIONS TO EACH OF YOUR FOLLOW-UP OFFICE VISITS.  You had several tests run on your heart this admission. Your echocardiogram (ultrasound) showed that your heart squeeze is strong and your valves open and close like they should. Your blood tests did not show any evidence of a heart attack. Your stress test did not show any signs of any significant blockages that would contribute to your chest pain. You did have evidence of some enlargement of the large artery that comes out of your heart called the Aorta.  It is 4.4 cm. This will need to be followed with another CT angiogram of your chest in 1 year. We will have you follow up with Dr. Allyson SabalBerry in 1 month. Please see your primary care doctor in 1-2 weeks for follow up.

## 2015-11-14 NOTE — Progress Notes (Signed)
[facility]                                                                              Patient Demographics  Keith Vance, is a 55 y.o. male, DOB - [birth year], ZOX:[id number]  Admit date - 11/11/2015   Admitting Physician Runell Gess, MD  Outpatient Primary MD for the patient is [facility]  LOS - 2  days    Chief Complaint  Patient presents with  . Chest Pain       Brief HPI  Patient is a 55 year old male with hypertension, hyperlipidemia, sleep apnea, prior history of SBO, hernia repair surgery presented to ED with chest pain. Patient was admitted by cardiology and planning to have stress test done today however around 3 AM patient started developing abdominal discomfort, distention with nausea and vomiting. Abdominal x-ray was concerning for ileus versus obstruction. Patient had NG tube placed and 1 L suctioned out. Internal medicine consult was obtained.  Assessment & Plan    Principal Problem:   SBO (small bowel obstruction) (HCC): Prior history of SBO, adhesions, prior hernia repair surgery - Resolved, having bowel movements, repeat abdominal x-ray 3/18 showed improved SBO. NGT was removed - Continue solid diet after the new Medicine stress test   Active Problems:  Lactic acidosis likely due to #1, dehydration -Patient is now tolerating diet, does not need any more IV fluids    Hypertension - Currently BP stable    Chest pain at rest - Patient has been seen by cardiology, planning stress test today - CTA negative for pulmonary embolism  Ascending aortic aneurysm incidentally seen on CT angiogram chest Aneurysmal dilatation in of the ascending aorta measures up to 4.4 cm Recommend annual imaging followup by CTA or MRA  Code Status: Full CODE STATUS   Family Communication: Discussed in detail with the patient, all imaging results, lab results explained to the patient   Disposition Plan: Cardiology planning to DC patient today if  stress test is negative. Patient's SBO has resolved. I discussed in detail with the patient to avoid constipation, exercise and eat high fiber diet. I will sign off. Please call me if any questions.  Time Spent in minutes   25 minutes  Procedures  NGT   Consults   Cardiology  surgery  DVT Prophylaxis   heparin  Medications  Scheduled Meds: . aspirin EC  81 mg Oral Daily  . heparin  5,000 Units Subcutaneous 3 times per day  . hydrochlorothiazide  12.5 mg Oral Daily  . lisinopril  20 mg Oral Daily  . pantoprazole  40 mg Oral Q0600   Continuous Infusions:   PRN Meds:.acetaminophen, hydrALAZINE, iohexol, nitroGLYCERIN, ondansetron (ZOFRAN) IV   Antibiotics   Anti-infectives    None        Subjective:   Keith Vance was seen and examined today. Feels better, had multiple BMs yesterday, passing flatus, tolerating diet. No abdominal pain, nausea or vomiting. No fevers or chills.  Patient denies dizziness, chest pain, shortness of breath, new weakness, numbess, tingling.  Objective:   Filed Vitals:   11/13/15 0509 11/13/15 1500 11/13/15 2100 11/14/15 0600  BP: 146/92 153/93 135/90 146/97  Pulse: 95 72 70   Temp: 99.1 F (37.3 C) 99.5 F (37.5 C) 97.8 F (36.6 C) 98.2 F (36.8 C)  TempSrc: Oral Oral  Oral  Resp: 18 16 16 16   Height:      Weight: 120.339 kg (265 lb 4.8 oz)   119.568 kg (263 lb 9.6 oz)  SpO2: 93% 96% 95% 95%    Intake/Output Summary (Last 24 hours) at 11/14/15 1023 Last data filed at 11/13/15 2000  Gross per 24 hour  Intake    480 ml  Output      0 ml  Net    480 ml     Wt Readings from Last 3 Encounters:  11/14/15 119.568 kg (263 lb 9.6 oz)  08/25/15 128.731 kg (283 lb 12.8 oz)  02/27/15 121.11 kg (267 lb)     Exam  General: Alert and oriented x 3, NAD  HEENT:   Neck:   CVS: S1 S2 auscultated, no rubs, murmurs or gallops. Regular rate and rhythm.  Respiratory: Clear to auscultation bilaterally, no wheezing, rales or  rhonchi  Abdomen: Soft, nontender, nondistended, normal bowel sounds  Ext: no cyanosis clubbing or edema  Neuro: no new deficits  Skin: No rashes  Psych: Normal affect and demeanor, alert and oriented x3    Data Reviewed:  I have personally reviewed following labs and imaging studies  Micro Results No results found for this or any previous visit (from the past 240 hour(s)).  Radiology Reports Dg Chest 2 View  11/11/2015  CLINICAL DATA:  Centralized chest pain, shortness of breath, diaphoresis tonight. History of hypertension, hyperlipidemia. EXAM: CHEST  2 VIEW COMPARISON:  None available for comparison at time of study interpretation. FINDINGS: Cardiac silhouette is mildly enlarged, mediastinal silhouette is normal. Low inspiratory examination with bibasilar atelectasis. No pleural effusion or focal consolidation. Mildly elevated RIGHT hemidiaphragm. No pneumothorax. Soft tissue planes and included osseous structures are nonsuspicious. IMPRESSION: Mild cardiomegaly and bibasilar atelectasis. Electronically Signed   By: Awilda Metroourtnay  Bloomer M.D.   On: 11/11/2015 05:57   Dg Abd 1 View  11/12/2015  CLINICAL DATA:  Acute onset of generalized abdominal pain and distention. Nausea and vomiting. Initial encounter. EXAM: ABDOMEN - 1 VIEW COMPARISON:  Abdominal radiograph performed 12/25/2014 FINDINGS: There is distention of small bowel loops up to 4.9 cm in maximal diameter, raising question for some degree of underlying bowel obstruction, though small bowel dysmotility might have a similar appearance. Mild residual air is seen within the colon. No free intra-abdominal air is seen, though evaluation for free air is limited on supine views. The visualized osseous structures are grossly unremarkable. Contrast is seen within the bladder. IMPRESSION: Distention of the small bowel loops up to 4.9 cm maximal diameter, raising question for some degree of underlying bowel obstruction, though small bowel  dysmotility might have a similar appearance. No free intra-abdominal air seen. Electronically Signed   By: Roanna RaiderJeffery  Chang M.D.   On: 11/12/2015 03:29   Ct Angio Chest Pe W/cm &/or Wo Cm  11/11/2015  CLINICAL DATA:  Short of breath and chest pain for 3 days EXAM: CT ANGIOGRAPHY CHEST WITH CONTRAST TECHNIQUE: Multidetector CT imaging of the chest was performed using the standard protocol during bolus administration of intravenous contrast. Multiplanar CT image reconstructions and MIPs were obtained to evaluate the vascular anatomy. CONTRAST:  100mL OMNIPAQUE IOHEXOL 350 MG/ML SOLN COMPARISON:  None. FINDINGS: There are no filling defects in the pulmonary arterial tree to suggest acute  pulmonary thromboembolism. No evidence of aortic dissection or intramural hematoma. Maximal diameter of the ascending aorta is 4.4 cm. No abnormal mediastinal adenopathy based on measurement criteria. The heart is enlarged. No pneumothorax.  No pleural effusion. Dependent atelectasis bilaterally.  No consolidation or lung mass. No vertebral compression deformity. Review of the MIP images confirms the above findings. IMPRESSION: No evidence of acute pulmonary thromboembolism. 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 This recommendation follows 2010 ACCF/AHA/AATS/ACR/ASA/SCA/SCAI/SIR/STS/SVM Guidelines for the Diagnosis and Management of Patients with Thoracic Aortic Disease. Circulation. 2010; 121: Z610-R604 Electronically Signed   By: Jolaine Click M.D.   On: 11/11/2015 19:16   Ct Abdomen Pelvis W Contrast  11/12/2015  CLINICAL DATA:  Abdominal discomfort, nausea and vomiting beginning at 3 a.m. last night. History of prior small bowel obstruction. Initial encounter. EXAM: CT ABDOMEN AND PELVIS WITH CONTRAST TECHNIQUE: Multidetector CT imaging of the abdomen and pelvis was performed using the standard protocol following bolus administration of intravenous  contrast. CONTRAST:  100 cc Omnipaque 350. COMPARISON:  Single view of the abdomen earlier today. CT abdomen and pelvis 08/25/2015. FINDINGS: No pleural or pericardial effusion. NG tube is in place with the tip in the mid stomach in good position. Mild dependent atelectasis is noted. There are some air-fluid levels and small bowel dilatation of up to 4.4 cm. The distal ileum is completely decompressed. Transition point appears to be in the right lower quadrant where bowel loops appear adhesed together. A small amount of interloop free fluid is seen in the left abdomen. There is no pneumatosis, portal venous gas or free intraperitoneal air. The stomach, colon and appendix appear normal. The right kidney, spleen, adrenal glands and pancreas are unremarkable. A punctate nonobstructing stone is seen in the lower pole of the left kidney. The liver is low attenuating compatible with fatty infiltration. Hypoattenuating lesion in the right hepatic lobe measuring 1.6 cm on image 37 is unchanged and may be a cyst or hemangioma. No lymphadenopathy is identified. IMPRESSION: Findings compatible with early or partial small bowel obstruction likely due to adhesions. No CT evidence of bowel ischemia. Fatty infiltration of the liver. Punctate nonobstructing stone lower pole left kidney. Atherosclerosis. Electronically Signed   By: Drusilla Kanner M.D.   On: 11/12/2015 11:16   Dg Chest Port 1 View  11/12/2015  CLINICAL DATA:  Mid chest pain.  Hypotension EXAM: PORTABLE CHEST 1 VIEW COMPARISON:  Yesterday FINDINGS: Apparent cardiomegaly and blunting of the lateral left costophrenic sulcus correlates with a large mediastinal fat pad on CT from yesterday. Negative aortic and hilar contours. There is no edema, consolidation, effusion, or pneumothorax. There is a nasogastric which at least reaches the stomach. IMPRESSION: No evidence of active disease. Electronically Signed   By: Marnee Spring M.D.   On: 11/12/2015 06:42   Dg Abd  2 Views  11/13/2015  CLINICAL DATA:  Small bowel obstruction EXAM: ABDOMEN - 2 VIEW COMPARISON:  Abdominal radiograph from 1 day prior FINDINGS: Enteric tube terminates in the distal stomach. There are a few residual mildly dilated small bowel loops measuring up to 3.5 cm diameter, significantly decreased. There is retained oral contrast throughout the colon. No evidence of pneumatosis or pneumoperitoneum. No pathologic soft tissue calcifications. IMPRESSION: Significantly radiographically improved small-bowel obstruction. Electronically Signed   By: Delbert Phenix M.D.   On: 11/13/2015 10:54   Dg Abd Portable 1v-small Bowel Obstruction Protocol-initial, 8 Hr Delay  11/13/2015  CLINICAL DATA:  Follow-up small bowel obstruction. Subsequent encounter. EXAM: PORTABLE ABDOMEN - 1 VIEW COMPARISON:  CT of the abdomen and pelvis performed earlier today at 10:48 a.m. FINDINGS: Previously ingested contrast is now seen within the colon, extending to the level of the rectum. There is no evidence for bowel obstruction. A distended loop of small bowel at the right mid abdomen, measuring 4.5 cm in diameter, likely reflects mild small bowel dysmotility. No free intra-abdominal air is seen, though evaluation for free air is limited on a single supine view. No acute osseous abnormalities are identified. IMPRESSION: Previous ingested contrast noted within the colon, extending to the level of the rectum. No evidence for bowel obstruction. Single distended loop of small bowel at the right mid abdomen likely reflects mild small bowel dysmotility. No free intra-abdominal air seen. Electronically Signed   By: Roanna Raider M.D.   On: 11/13/2015 00:08    CBC  Recent Labs Lab 11/11/15 0533 11/11/15 1303 11/12/15 0440  WBC 7.4 6.1 8.5  HGB 16.2 14.7 16.6  HCT 47.2 43.9 50.1  PLT 148* 144* 166  MCV 90.6 91.6 91.4  MCH 31.1 30.7 30.3  MCHC 34.3 33.5 33.1  RDW 13.4 13.3 13.2    Chemistries   Recent Labs Lab  11/11/15 0533 11/11/15 1303 11/11/15 2340 11/14/15 0518  NA 137  --  138 138  K 3.5  --  3.7 4.1  CL 102  --  99* 103  CO2 24  --  28 29  GLUCOSE 113*  --  111* 105*  BUN 18  --  12 6  CREATININE 0.75 0.79 0.82 0.82  CALCIUM 8.7*  --  8.8* 8.6*  AST 66*  --   --   --   ALT 147*  --   --   --   ALKPHOS 58  --   --   --   BILITOT 1.4*  --   --   --    ------------------------------------------------------------------------------------------------------------------ estimated creatinine clearance is 134.6 mL/min (by C-G formula based on Cr of 0.82). ------------------------------------------------------------------------------------------------------------------  Recent Labs  11/11/15 2340  HGBA1C 5.8*   ------------------------------------------------------------------------------------------------------------------  Recent Labs  11/11/15 2340  CHOL 171  HDL 53  LDLCALC 100*  TRIG 91  CHOLHDL 3.2   ------------------------------------------------------------------------------------------------------------------ No results for input(s): TSH, T4TOTAL, T3FREE, THYROIDAB in the last 72 hours.  Invalid input(s): FREET3 ------------------------------------------------------------------------------------------------------------------ No results for input(s): VITAMINB12, FOLATE, FERRITIN, TIBC, IRON, RETICCTPCT in the last 72 hours.  Coagulation profile No results for input(s): INR, PROTIME in the last 168 hours.  No results for input(s): DDIMER in the last 72 hours.  Cardiac Enzymes  Recent Labs Lab 11/11/15 1303 11/11/15 1836 11/11/15 2340  TROPONINI <0.03 <0.03 <0.03   ------------------------------------------------------------------------------------------------------------------ Invalid input(s): POCBNP   Recent Labs  11/12/15 1109 11/12/15 1626 11/12/15 2014 11/12/15 2348 11/13/15 1148 11/13/15 1654  GLUCAP 106* 97 82 90 93 91     RAI,RIPUDEEP  M.D. [facility] 11/14/2015, 10:23 AM  Pager: [phone] Between 7am to 7pm - call Pager - [phone]  After 7pm go to [email or web address] - password [email or web address]  Call night coverage person covering after 7pm

## 2015-12-24 ENCOUNTER — Ambulatory Visit: Payer: No Typology Code available for payment source | Admitting: Cardiovascular Disease

## 2016-01-07 ENCOUNTER — Ambulatory Visit (INDEPENDENT_AMBULATORY_CARE_PROVIDER_SITE_OTHER): Payer: BLUE CROSS/BLUE SHIELD | Admitting: Cardiovascular Disease

## 2016-01-07 ENCOUNTER — Encounter: Payer: Self-pay | Admitting: Cardiovascular Disease

## 2016-01-07 ENCOUNTER — Encounter: Payer: Self-pay | Admitting: *Deleted

## 2016-01-07 VITALS — BP 142/87 | HR 71 | Ht 70.5 in | Wt 274.0 lb

## 2016-01-07 DIAGNOSIS — I712 Thoracic aortic aneurysm, without rupture, unspecified: Secondary | ICD-10-CM | POA: Insufficient documentation

## 2016-01-07 DIAGNOSIS — E785 Hyperlipidemia, unspecified: Secondary | ICD-10-CM

## 2016-01-07 DIAGNOSIS — I10 Essential (primary) hypertension: Secondary | ICD-10-CM

## 2016-01-07 NOTE — Assessment & Plan Note (Signed)
istory of recently recognized moderate thoracic aortic dilatation by CT angiography. This measured 4.4 cm. We will recheck this on an annual basis.

## 2016-01-07 NOTE — Assessment & Plan Note (Signed)
History of hypertension with blood pressure measures 142/87. He is on amlodipine, lisinopril and hydrochlorothiazide. Continue current meds at current dosing

## 2016-01-07 NOTE — Patient Instructions (Signed)
Medication Instructions:  Your physician recommends that you continue on your current medications as directed. Please refer to the Current Medication list given to you today.   Labwork: NONE  Testing/Procedures: Non-Cardiac CT Angiography (CTA) OR YOUR AORTA, is a special type of CT scan that uses a computer to produce multi-dimensional views of major blood vessels throughout the body. In CT angiography, a contrast material is injected through an IV to help visualize the blood vessels.    Follow-Up: Your physician wants you to follow-up in: 12 MONTHS WITH DR Allyson SabalBERRY FOLLOWING CTA CHEST AORTA. You will receive a reminder letter in the mail two months in advance. If you don't receive a letter, please call our office to schedule the follow-up appointment.   YOU ARE CLEARED AT LOW RISK FOR YOU UPCOMING SURGERY. LETTER WILL BE FAXED TO YOUR DOCTOR.  Any Other Special Instructions Will Be Listed Below (If Applicable).     If you need a refill on your cardiac medications before your next appointment, please call your pharmacy.

## 2016-01-07 NOTE — Progress Notes (Signed)
01/07/2016 Keith Vance   03-Mar-1961  [id number]  Primary Physician Harvie Junior, MD Primary Cardiologist: Lorretta Harp MD Renae Gloss   HPI:  Keith Vance is a 55 year old severely overweight married Caucasian male father of one child is accompanied by his wife Keith Vance. I recently met him during a hospitalization for atypical chest pain and small bowel obstruction. He had a remote cardiac catheterization at [place] revealing normal coronary arteries. This was Dr. Vernona Rieger hypertension. He's never had a heart attack or stroke. There is no family history A 2-D echo was entirely normal during his recent consultation as was a Myoview stress test. His small bowel obstruction resolved with conservative therapy. He denies chest pain. He probably needs a total knee replacement in the upcoming future.   Current Outpatient Prescriptions  Medication Sig Dispense Refill  . amLODipine (NORVASC) 10 MG tablet Take 10 mg by mouth daily.    [name] aspirin EC 81 MG EC tablet Take 1 tablet (81 mg total) by mouth daily.    [name] docusate sodium (COLACE) 100 MG capsule Take 100 mg by mouth 2 (two) times daily as needed for mild constipation.    [name] HYDROcodone-acetaminophen (NORCO) 10-325 MG tablet Take 1 tablet by mouth 3 (three) times daily as needed.  0  . Inulin (FIBER CHOICE PO) Take 1 tablet by mouth daily.    [name] lisinopril-hydrochlorothiazide (PRINZIDE,ZESTORETIC) 20-12.5 MG tablet Take 1 tablet by mouth daily.    . meloxicam (MOBIC) 15 MG tablet Take 1 tablet by mouth daily.  2  . Multiple Vitamins-Minerals (MULTIVITAMIN WITH MINERALS) tablet Take 1 tablet by mouth daily.    [name] omega-3 acid ethyl esters (LOVAZA) 1 g capsule Take 2 g by mouth daily.     No current facility-administered medications for this visit.    No Known Allergies  Social History   Social History  . Marital Status: Single    Spouse Name: N/A  . Number of Children: N/A  . Years of  Education: N/A   Occupational History  . Not on file.   Social History Main Topics  . Smoking status: Former Smoker    Quit date: 11/11/1979  . Smokeless tobacco: Never Used  . Alcohol Use: No  . Drug Use: No  . Sexual Activity: Yes   Other Topics Concern  . Not on file   Social History Narrative     Review of Systems: General: negative for chills, fever, night sweats or weight changes.  Cardiovascular: negative for chest pain, dyspnea on exertion, edema, orthopnea, palpitations, paroxysmal nocturnal dyspnea or shortness of breath Dermatological: negative for rash Respiratory: negative for cough or wheezing Urologic: negative for hematuria Abdominal: negative for nausea, vomiting, diarrhea, bright red blood per rectum, melena, or hematemesis Neurologic: negative for visual changes, syncope, or dizziness All other systems reviewed and are otherwise negative except as noted above.    Blood pressure 142/87, pulse 71, height 5' 10.5" (1.791 m), weight 274 lb (124.286 kg).  General appearance: alert and no distress Neck: no adenopathy, no carotid bruit, no JVD, supple, symmetrical, trachea midline and thyroid not enlarged, symmetric, no tenderness/mass/nodules Lungs: clear to auscultation bilaterally Heart: regular rate and rhythm, S1, S2 normal, no murmur, click, rub or gallop Extremities: extremities normal, atraumatic, no cyanosis or edema  EKG not performed today  ASSESSMENT AND PLAN:   Hypertension History of hypertension with blood pressure measures 142/87. He is on amlodipine, lisinopril and hydrochlorothiazide. Continue current meds at current  dosing  Hyperlipidemia History of hyperlipidemia not on statin therapy with recent lipid profile performed 11/11/15 revealing total cholesterol 171, LDL 100 and HDL of 53  Thoracic aortic aneurysm (HCC) istory of recently recognized moderate thoracic aortic dilatation by CT angiography. This measured 4.4 cm. We will recheck  this on an annual basis.      Lorretta Harp MD FACP,FACC,FAHA, [facility] 01/07/2016 4:57 PM

## 2016-01-07 NOTE — Assessment & Plan Note (Addendum)
History of hyperlipidemia not on statin therapy with recent lipid profile performed 11/11/15 revealing total cholesterol 171, LDL 100 and HDL of 53

## 2016-02-14 ENCOUNTER — Telehealth: Payer: Self-pay | Admitting: Cardiovascular Disease

## 2016-02-14 NOTE — Telephone Encounter (Signed)
Error/pt called wrong office °

## 2016-04-12 ENCOUNTER — Encounter (HOSPITAL_COMMUNITY): Payer: Self-pay

## 2016-04-12 DIAGNOSIS — M1712 Unilateral primary osteoarthritis, left knee: Secondary | ICD-10-CM | POA: Diagnosis not present

## 2016-04-12 DIAGNOSIS — M25562 Pain in left knee: Secondary | ICD-10-CM | POA: Diagnosis not present

## 2016-04-12 NOTE — Patient Instructions (Addendum)
Keith CockingRoy A Crossan  04/12/2016   Your procedure is scheduled on: 04/24/16  [facility]  Entrance take [facility]  elevators to 3rd floor to  Short Stay Center at  0930 AM.  Call this number if you have problems the morning of surgery [phone]   Remember: ONLY 1 PERSON MAY GO WITH YOU TO SHORT STAY TO GET  READY MORNING OF YOUR SURGERY.  Do not eat food or drink liquids :After Midnight.     Take these medicines the morning of surgery with A SIP OF WATER: Amlodipine May takeTRAMADOL if needed DO NOT TAKE ANY DIABETIC MEDICATIONS DAY OF YOUR SURGERY         BRING CPAP MASK AND TUBING WITH YOU TO HOSPITAL                      You may not have any metal on your body including hair pins and              piercings  Do not wear jewelry, make-up, lotions, powders or perfumes, deodorant             Do not wear nail polish.  Do not shave  48 hours prior to surgery.              Men may shave face and neck.   Do not bring valuables to the hospital. [facility] IS NOT             RESPONSIBLE   FOR VALUABLES.  Contacts, dentures or bridgework may not be worn into surgery.  Leave suitcase in the car. After surgery it may be brought to your room.           [facility] - Preparing for Surgery Before surgery, you can play an important role.  Because skin is not sterile, your skin needs to be as free of germs as possible.  You can reduce the number of germs on your skin by washing with CHG (chlorahexidine gluconate) soap before surgery.  CHG is an antiseptic cleaner which kills germs and bonds with the skin to continue killing germs even after washing. Please DO NOT use if you have an allergy to CHG or antibacterial soaps.  If your skin becomes reddened/irritated stop using the CHG and inform your nurse when you arrive at Short Stay. Do not shave (including legs and underarms) for at least 48 hours prior to the first CHG shower.  You may shave your face/neck. Please follow  these instructions carefully:  1.  Shower with CHG Soap the night before surgery and the  morning of Surgery.  2.  If you choose to wash your hair, wash your hair first as usual with your  normal  shampoo.  3.  After you shampoo, rinse your hair and body thoroughly to remove the  shampoo.                           4.  Use CHG as you would any other liquid soap.  You can apply chg directly  to the skin and wash                       Gently with a scrungie or clean washcloth.  5.  Apply the CHG Soap to your body ONLY FROM THE NECK DOWN.  Do not use on face/ open                           Wound or open sores. Avoid contact with eyes, ears mouth and genitals (private parts).                       Wash face,  Genitals (private parts) with your normal soap.             6.  Wash thoroughly, paying special attention to the area where your surgery  will be performed.  7.  Thoroughly rinse your body with warm water from the neck down.  8.  DO NOT shower/wash with your normal soap after using and rinsing off  the CHG Soap.                9.  Pat yourself dry with a clean towel.            10.  Wear clean pajamas.            11.  Place clean sheets on your bed the night of your first shower and do not  sleep with pets. Day of Surgery : Do not apply any lotions/deodorants the morning of surgery.  Please wear clean clothes to the hospital/surgery center.  FAILURE TO FOLLOW THESE INSTRUCTIONS MAY RESULT IN THE CANCELLATION OF YOUR SURGERY PATIENT SIGNATURE_________________________________  NURSE SIGNATURE__________________________________  ________________________________________________________________________   Adam Phenix  An incentive spirometer is a tool that can help keep your lungs clear and active. This tool measures how well you are filling your lungs with each breath. Taking long deep breaths may help reverse or decrease the chance of developing breathing (pulmonary) problems  (especially infection) following:  A long period of time when you are unable to move or be active. BEFORE THE PROCEDURE   If the spirometer includes an indicator to show your best effort, your nurse or respiratory therapist will set it to a desired goal.  If possible, sit up straight or lean slightly forward. Try not to slouch.  Hold the incentive spirometer in an upright position. INSTRUCTIONS FOR USE  1. Sit on the edge of your bed if possible, or sit up as far as you can in bed or on a chair. 2. Hold the incentive spirometer in an upright position. 3. Breathe out normally. 4. Place the mouthpiece in your mouth and seal your lips tightly around it. 5. Breathe in slowly and as deeply as possible, raising the piston or the ball toward the top of the column. 6. Hold your breath for 3-5 seconds or for as long as possible. Allow the piston or ball to fall to the bottom of the column. 7. Remove the mouthpiece from your mouth and breathe out normally. 8. Rest for a few seconds and repeat Steps 1 through 7 at least 10 times every 1-2 hours when you are awake. Take your time and take a few normal breaths between deep breaths. 9. The spirometer may include an indicator to show your best effort. Use the indicator as a goal to work toward during each repetition. 10. After each set of 10 deep breaths, practice coughing to be sure your lungs are clear. If you have an incision (the cut made at the time of surgery), support your incision when coughing by placing a pillow or rolled up towels firmly against it. Once you are able to get out of  bed, walk around indoors and cough well. You may stop using the incentive spirometer when instructed by your caregiver.  RISKS AND COMPLICATIONS  Take your time so you do not get dizzy or light-headed.  If you are in pain, you may need to take or ask for pain medication before doing incentive spirometry. It is harder to take a deep breath if you are having  pain. AFTER USE  Rest and breathe slowly and easily.  It can be helpful to keep track of a log of your progress. Your caregiver can provide you with a simple table to help with this. If you are using the spirometer at home, follow these instructions: [place] IF:   You are having difficultly using the spirometer.  You have trouble using the spirometer as often as instructed.  Your pain medication is not giving enough relief while using the spirometer.  You develop fever of 100.5 F (38.1 C) or higher. SEEK IMMEDIATE MEDICAL CARE IF:   You cough up bloody sputum that had not been present before.  You develop fever of 102 F (38.9 C) or greater.  You develop worsening pain at or near the incision site. MAKE SURE YOU:   Understand these instructions.  Will watch your condition.  Will get help right away if you are not doing well or get worse. Document Released: 12/25/2006 Document Revised: 11/06/2011 Document Reviewed: 02/25/2007 ExitCare Patient Information 2014 ExitCare, [place].   ________________________________________________________________________  WHAT IS A BLOOD TRANSFUSION? Blood Transfusion Information  A transfusion is the replacement of blood or some of its parts. Blood is made up of multiple cells which provide different functions.  Red blood cells carry oxygen and are used for blood loss replacement.  White blood cells fight against infection.  Platelets control bleeding.  Plasma helps clot blood.  Other blood products are available for specialized needs, such as hemophilia or other clotting disorders. BEFORE THE TRANSFUSION  Who gives blood for transfusions?   Healthy volunteers who are fully evaluated to make sure their blood is safe. This is blood bank blood. Transfusion therapy is the safest it has ever been in the practice of medicine. Before blood is taken from a donor, a complete history is taken to make sure that person has no history  of diseases nor engages in risky social behavior (examples are intravenous drug use or sexual activity with multiple partners). The donor's travel history is screened to minimize risk of transmitting infections, such as malaria. The donated blood is tested for signs of infectious diseases, such as HIV and hepatitis. The blood is then tested to be sure it is compatible with you in order to minimize the chance of a transfusion reaction. If you or a relative donates blood, this is often done in anticipation of surgery and is not appropriate for emergency situations. It takes many days to process the donated blood. RISKS AND COMPLICATIONS Although transfusion therapy is very safe and saves many lives, the main dangers of transfusion include:   Getting an infectious disease.  Developing a transfusion reaction. This is an allergic reaction to something in the blood you were given. Every precaution is taken to prevent this. The decision to have a blood transfusion has been considered carefully by your caregiver before blood is given. Blood is not given unless the benefits outweigh the risks. AFTER THE TRANSFUSION  Right after receiving a blood transfusion, you will usually feel much better and more energetic. This is especially true if your red blood  cells have gotten low (anemic). The transfusion raises the level of the red blood cells which carry oxygen, and this usually causes an energy increase.  The nurse administering the transfusion will monitor you carefully for complications. HOME CARE INSTRUCTIONS  No special instructions are needed after a transfusion. You may find your energy is better. Speak with your caregiver about any limitations on activity for underlying diseases you may have. SEEK MEDICAL CARE IF:   Your condition is not improving after your transfusion.  You develop redness or irritation at the intravenous (IV) site. SEEK IMMEDIATE MEDICAL CARE IF:  Any of the following symptoms  occur over the next 12 hours:  Shaking chills.  You have a temperature by mouth above 102 F (38.9 C), not controlled by medicine.  Chest, back, or muscle pain.  People around you feel you are not acting correctly or are confused.  Shortness of breath or difficulty breathing.  Dizziness and fainting.  You get a rash or develop hives.  You have a decrease in urine output.  Your urine turns a dark color or changes to pink, red, or brown. Any of the following symptoms occur over the next 10 days:  You have a temperature by mouth above 102 F (38.9 C), not controlled by medicine.  Shortness of breath.  Weakness after normal activity.  The white part of the eye turns yellow (jaundice).  You have a decrease in the amount of urine or are urinating less often.  Your urine turns a dark color or changes to pink, red, or brown. Document Released: 08/11/2000 Document Revised: 11/06/2011 Document Reviewed: 03/30/2008 [facility] Patient Information 2014 [place], [place].  _______________________________________________________________________

## 2016-04-12 NOTE — Progress Notes (Signed)
ekg 3/17 Stress 11/12/16 eccho 3/17 Chest 3/17 Ct angio 3/17 lov dr berry 5/17

## 2016-04-13 NOTE — H&P (Signed)
TOTAL KNEE ADMISSION H&P  Patient is being admitted for left total knee arthroplasty.  Subjective:  Chief Complaint:    Left knee primary OA / pain  HPI: Keith Vance, 55 y.o. male, has a history of pain and functional disability in the left knee due to arthritis and has failed non-surgical conservative treatments for greater than 12 weeks to include NSAID's and/or analgesics, corticosteriod injections, use of assistive devices and activity modification.  Onset of symptoms was gradual, starting 3-4 years ago with gradually worsening course since that time. The patient noted no past surgery on the left knee(s).  Patient currently rates pain in the left knee(s) at 10 out of 10 with activity. Patient has night pain, worsening of pain with activity and weight bearing, pain that interferes with activities of daily living, pain with passive range of motion, crepitus and joint swelling.  Patient has evidence of periarticular osteophytes and joint space narrowing by imaging studies.  There is no active infection.   Risks, benefits and expectations were discussed with the patient.  Risks including but not limited to the risk of anesthesia, blood clots, nerve damage, blood vessel damage, failure of the prosthesis, infection and up to and including death.  Patient understand the risks, benefits and expectations and wishes to proceed with surgery.   PCP: Jearld Leschwight M Williams, MD  D/C Plans:      Home  Post-op Meds:       No Rx given  Tranexamic Acid:      To be given - IV   Decadron:      Is to be given  FYI:     ASA  Norco  CPAP    Patient Active Problem List   Diagnosis Date Noted  . Thoracic aortic aneurysm (HCC) 01/07/2016  . Midsternal chest pain 11/14/2015  . Essential hypertension 11/14/2015  . Lactic acidosis 11/14/2015  . Dehydration 11/14/2015  . Positive D dimer   . Small bowel obstruction (HCC)   . Abdominal distension   . Chest pain 11/11/2015  . SBO (small bowel obstruction)  (HCC) 02/27/2015  . Enteritis 02/27/2015  . Abdominal pain, periumbilical 02/27/2015  . Nausea and vomiting 02/27/2015  . Hypertension 02/27/2015  . Hyperlipidemia 02/27/2015  . Leukocytosis 02/27/2015   Past Medical History:  Diagnosis Date  . Arthritis   . Ascending aortic aneurysm (HCC)    a. 10/2015 CTA chest: 4.4cm aneurysmal dil of Asc Ao-->rec f/u in 1 yr.  . Hyperlipidemia   . Hypertension   . SBO (small bowel obstruction) (HCC)    a. 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  [name]. Sleep apnea     Past Surgical History:  Procedure Laterality Date  . HERNIA REPAIR      No prescriptions prior to admission.   No Known Allergies   Social History  Substance Use Topics  . Smoking status: Former Smoker    Quit date: 11/11/1979  . Smokeless tobacco: Never Used  . Alcohol use No    Family History  Problem Relation Age of Onset  . Heart attack Maternal Grandmother     60>60s of age  . Hypertension Father   . Diabetes Father      Review of Systems  Constitutional: Negative.   HENT: Negative.   Eyes: Negative.   Respiratory: Negative.   Cardiovascular: Negative.   Gastrointestinal: Negative.   Genitourinary: Negative.   Musculoskeletal: Positive for joint pain.  Skin: Negative.   Neurological: Negative.   Endo/Heme/Allergies: Negative.   Psychiatric/Behavioral: Negative.  Objective:  Physical Exam  Constitutional: He is oriented to person, place, and time. He appears well-developed.  HENT:  Head: Normocephalic.  Eyes: Pupils are equal, round, and reactive to light.  Neck: Neck supple. No JVD present. No tracheal deviation present. No thyromegaly present.  Cardiovascular: Normal rate, regular rhythm, normal heart sounds and intact distal pulses.   Respiratory: Effort normal and breath sounds normal. No respiratory distress. He has no wheezes.  GI: Soft. There is no tenderness. There is no guarding.  Musculoskeletal:       Left knee: He exhibits decreased range of motion,  swelling and bony tenderness. He exhibits no ecchymosis, no deformity, no laceration and no erythema. Tenderness found.  Lymphadenopathy:    He has no cervical adenopathy.  Neurological: He is alert and oriented to person, place, and time.  Skin: Skin is warm and dry.  Psychiatric: He has a normal mood and affect.      Labs:  Estimated body mass index is 38.76 kg/m as calculated from the following:   Height as of 01/07/16: 5' 10.5" (1.791 m).   Weight as of 01/07/16: 124.3 kg (274 lb).   Imaging Review Plain radiographs demonstrate severe degenerative joint disease of the left knee(s). The overall alignment is neutral. The bone quality appears to be good for age and reported activity level.  Assessment/Plan:  End stage arthritis, left knee   The patient history, physical examination, clinical judgment of the provider and imaging studies are consistent with end stage degenerative joint disease of the left knee(s) and total knee arthroplasty is deemed medically necessary. The treatment options including medical management, injection therapy arthroscopy and arthroplasty were discussed at length. The risks and benefits of total knee arthroplasty were presented and reviewed. The risks due to aseptic loosening, infection, stiffness, patella tracking problems, thromboembolic complications and other imponderables were discussed. The patient acknowledged the explanation, agreed to proceed with the plan and consent was signed. Patient is being admitted for inpatient treatment for surgery, pain control, PT, OT, prophylactic antibiotics, VTE prophylaxis, progressive ambulation and ADL's and discharge planning. The patient is planning to be discharged home.    Anastasio AuerbachMatthew S. Demica Zook   PA-C  04/13/2016, 12:13 PM

## 2016-04-14 ENCOUNTER — Encounter (HOSPITAL_COMMUNITY): Payer: Self-pay

## 2016-04-14 ENCOUNTER — Encounter (HOSPITAL_COMMUNITY)
Admission: RE | Admit: 2016-04-14 | Discharge: 2016-04-14 | Disposition: A | Discharge status: Home / Self Care | Payer: BLUE CROSS/BLUE SHIELD | Source: Ambulatory Visit | Attending: Orthopedic Surgery | Admitting: Orthopedic Surgery

## 2016-04-14 DIAGNOSIS — Z01812 Encounter for preprocedural laboratory examination: Secondary | ICD-10-CM | POA: Insufficient documentation

## 2016-04-14 HISTORY — DX: Personal history of urinary calculi: Z87.442

## 2016-04-14 HISTORY — DX: Pneumonia, unspecified organism: J18.9

## 2016-04-14 LAB — BASIC METABOLIC PANEL
Anion gap: 7 (ref 5–15)
BUN: 21 mg/dL — ABNORMAL HIGH (ref 6–20)
CO2: 28 mmol/L (ref 22–32)
Calcium: 9.1 mg/dL (ref 8.9–10.3)
Chloride: 106 mmol/L (ref 101–111)
Creatinine, Ser: 0.95 mg/dL (ref 0.61–1.24)
GFR calc Af Amer: >60 mL/min (ref >60)
GFR calc non Af Amer: >60 mL/min (ref >60)
Glucose, Bld: 102 mg/dL — ABNORMAL HIGH (ref 65–99)
Potassium: 3.4 mmol/L — ABNORMAL LOW (ref 3.5–5.1)
Sodium: 141 mmol/L (ref 135–145)

## 2016-04-14 LAB — CBC
HCT: 46.6 % (ref 39.0–52.0)
Hemoglobin: 16.1 g/dL (ref 13.0–17.0)
MCH: 32.1 pg (ref 26.0–34.0)
MCHC: 34.5 g/dL (ref 30.0–36.0)
MCV: 93 fL (ref 78.0–100.0)
Platelets: 163 10*3/uL (ref 150–400)
RBC: 5.01 MIL/uL (ref 4.22–5.81)
RDW: 12.9 % (ref 11.5–15.5)
WBC: 6.5 10*3/uL (ref 4.0–10.5)

## 2016-04-14 LAB — SURGICAL PCR SCREEN
MRSA, PCR: NEGATIVE
Staphylococcus aureus: POSITIVE — AB

## 2016-04-14 LAB — ABO/RH: ABO/RH(D): O POS

## 2016-04-14 NOTE — Progress Notes (Signed)
Clearance dr berry chart

## 2016-04-24 ENCOUNTER — Inpatient Hospital Stay (HOSPITAL_COMMUNITY): Payer: BLUE CROSS/BLUE SHIELD | Admitting: Registered Nurse

## 2016-04-24 ENCOUNTER — Inpatient Hospital Stay (HOSPITAL_COMMUNITY)
Admission: RE | Admit: 2016-04-24 | Discharge: 2016-04-25 | DRG: 470 | Disposition: A | Discharge status: Home Health | Payer: BLUE CROSS/BLUE SHIELD | Source: Ambulatory Visit | Attending: Orthopedic Surgery | Admitting: Orthopedic Surgery

## 2016-04-24 ENCOUNTER — Encounter (HOSPITAL_COMMUNITY)
Admission: RE | Disposition: A | Discharge status: Home Health | Payer: Self-pay | Source: Ambulatory Visit | Attending: Orthopedic Surgery

## 2016-04-24 ENCOUNTER — Encounter (HOSPITAL_COMMUNITY): Payer: Self-pay

## 2016-04-24 DIAGNOSIS — Z8249 Family history of ischemic heart disease and other diseases of the circulatory system: Secondary | ICD-10-CM | POA: Diagnosis not present

## 2016-04-24 DIAGNOSIS — Z87442 Personal history of urinary calculi: Secondary | ICD-10-CM | POA: Diagnosis not present

## 2016-04-24 DIAGNOSIS — I712 Thoracic aortic aneurysm, without rupture: Secondary | ICD-10-CM | POA: Diagnosis present

## 2016-04-24 DIAGNOSIS — Z96659 Presence of unspecified artificial knee joint: Secondary | ICD-10-CM

## 2016-04-24 DIAGNOSIS — M25562 Pain in left knee: Secondary | ICD-10-CM | POA: Diagnosis not present

## 2016-04-24 DIAGNOSIS — I10 Essential (primary) hypertension: Secondary | ICD-10-CM | POA: Diagnosis present

## 2016-04-24 DIAGNOSIS — Z96652 Presence of left artificial knee joint: Secondary | ICD-10-CM

## 2016-04-24 DIAGNOSIS — M659 Synovitis and tenosynovitis, unspecified: Secondary | ICD-10-CM | POA: Diagnosis present

## 2016-04-24 DIAGNOSIS — G4730 Sleep apnea, unspecified: Secondary | ICD-10-CM | POA: Diagnosis not present

## 2016-04-24 DIAGNOSIS — Z87891 Personal history of nicotine dependence: Secondary | ICD-10-CM

## 2016-04-24 DIAGNOSIS — Z79899 Other long term (current) drug therapy: Secondary | ICD-10-CM | POA: Diagnosis not present

## 2016-04-24 DIAGNOSIS — Z833 Family history of diabetes mellitus: Secondary | ICD-10-CM | POA: Diagnosis not present

## 2016-04-24 DIAGNOSIS — E785 Hyperlipidemia, unspecified: Secondary | ICD-10-CM | POA: Diagnosis not present

## 2016-04-24 DIAGNOSIS — M1712 Unilateral primary osteoarthritis, left knee: Principal | ICD-10-CM | POA: Diagnosis present

## 2016-04-24 DIAGNOSIS — M179 Osteoarthritis of knee, unspecified: Secondary | ICD-10-CM | POA: Diagnosis not present

## 2016-04-24 DIAGNOSIS — Z7982 Long term (current) use of aspirin: Secondary | ICD-10-CM | POA: Diagnosis not present

## 2016-04-24 HISTORY — PX: TOTAL KNEE ARTHROPLASTY: SHX125

## 2016-04-24 LAB — TYPE AND SCREEN
ABO/RH(D): O POS
Antibody Screen: NEGATIVE

## 2016-04-24 SURGERY — ARTHROPLASTY, KNEE, TOTAL
Anesthesia: Spinal | Site: Knee | Laterality: Left

## 2016-04-24 MED ORDER — STERILE WATER FOR IRRIGATION IR SOLN
Status: DC | PRN
  Administered 2016-04-24: 2000 mL

## 2016-04-24 MED ORDER — SODIUM CHLORIDE 0.9 % IV SOLN
INTRAVENOUS | Status: DC
  Administered 2016-04-24: 16:00:00 via INTRAVENOUS
  Filled 2016-04-24 (×4): qty 1000

## 2016-04-24 MED ORDER — SODIUM CHLORIDE 0.9 % IJ SOLN
INTRAMUSCULAR | Status: DC | PRN
  Administered 2016-04-24: 29 mL

## 2016-04-24 MED ORDER — CEFAZOLIN SODIUM-DEXTROSE 2-4 GM/100ML-% IV SOLN: 2.0000 g | INTRAVENOUS | Status: DC

## 2016-04-24 MED ORDER — DEXAMETHASONE SODIUM PHOSPHATE 10 MG/ML IJ SOLN
10.0000 mg | Freq: Once | INTRAMUSCULAR | Status: AC
  Administered 2016-04-25: 10 mg via INTRAVENOUS
  Filled 2016-04-24: qty 1

## 2016-04-24 MED ORDER — DOCUSATE SODIUM 100 MG PO CAPS
100.0000 mg | ORAL_CAPSULE | Freq: Two times a day (BID) | ORAL | Status: DC
  Administered 2016-04-24 – 2016-04-25 (×2): 100 mg via ORAL
  Filled 2016-04-24 (×2): qty 1

## 2016-04-24 MED ORDER — TRANEXAMIC ACID 1000 MG/10ML IV SOLN
1000.0000 mg | Freq: Once | INTRAVENOUS | Status: AC
  Administered 2016-04-24: 1000 mg via INTRAVENOUS
  Filled 2016-04-24: qty 10

## 2016-04-24 MED ORDER — HYDROMORPHONE HCL 1 MG/ML IJ SOLN: 0.2500 mg | INTRAMUSCULAR | Status: DC | PRN

## 2016-04-24 MED ORDER — BUPIVACAINE-EPINEPHRINE (PF) 0.25% -1:200000 IJ SOLN
INTRAMUSCULAR | Status: AC
  Filled 2016-04-24: qty 30

## 2016-04-24 MED ORDER — ONDANSETRON HCL 4 MG/2ML IJ SOLN
4.0000 mg | Freq: Four times a day (QID) | INTRAMUSCULAR | Status: DC | PRN
  Administered 2016-04-25: 4 mg via INTRAVENOUS
  Filled 2016-04-24: qty 2

## 2016-04-24 MED ORDER — KETOROLAC TROMETHAMINE 30 MG/ML IJ SOLN
INTRAMUSCULAR | Status: AC
  Filled 2016-04-24: qty 1

## 2016-04-24 MED ORDER — DEXAMETHASONE SODIUM PHOSPHATE 10 MG/ML IJ SOLN
10.0000 mg | Freq: Once | INTRAMUSCULAR | Status: AC
  Administered 2016-04-24: 10 mg via INTRAVENOUS

## 2016-04-24 MED ORDER — DIPHENHYDRAMINE HCL 25 MG PO CAPS: 25.0000 mg | ORAL_CAPSULE | Freq: Four times a day (QID) | ORAL | Status: DC | PRN

## 2016-04-24 MED ORDER — CHLORHEXIDINE GLUCONATE 4 % EX LIQD: 60.0000 mL | Freq: Once | CUTANEOUS | Status: DC

## 2016-04-24 MED ORDER — CEFAZOLIN SODIUM-DEXTROSE 2-4 GM/100ML-% IV SOLN
2.0000 g | Freq: Four times a day (QID) | INTRAVENOUS | Status: AC
  Administered 2016-04-24 (×2): 2 g via INTRAVENOUS
  Filled 2016-04-24: qty 100

## 2016-04-24 MED ORDER — BUPIVACAINE-EPINEPHRINE (PF) 0.25% -1:200000 IJ SOLN
INTRAMUSCULAR | Status: DC | PRN
  Administered 2016-04-24: 30 mL

## 2016-04-24 MED ORDER — PROPOFOL 500 MG/50ML IV EMUL
INTRAVENOUS | Status: DC | PRN
  Administered 2016-04-24: 50 ug/kg/min via INTRAVENOUS

## 2016-04-24 MED ORDER — CELECOXIB 200 MG PO CAPS
200.0000 mg | ORAL_CAPSULE | Freq: Two times a day (BID) | ORAL | Status: DC
  Administered 2016-04-24 – 2016-04-25 (×2): 200 mg via ORAL
  Filled 2016-04-24 (×2): qty 1

## 2016-04-24 MED ORDER — MIDAZOLAM HCL 5 MG/5ML IJ SOLN
INTRAMUSCULAR | Status: DC | PRN
  Administered 2016-04-24: 2 mg via INTRAVENOUS

## 2016-04-24 MED ORDER — POLYETHYLENE GLYCOL 3350 17 G PO PACK
17.0000 g | PACK | Freq: Two times a day (BID) | ORAL | Status: DC
  Administered 2016-04-24 – 2016-04-25 (×2): 17 g via ORAL
  Filled 2016-04-24: qty 1

## 2016-04-24 MED ORDER — PROPOFOL 10 MG/ML IV BOLUS
INTRAVENOUS | Status: DC | PRN
  Administered 2016-04-24: 20 mg via INTRAVENOUS

## 2016-04-24 MED ORDER — HYDROCODONE-ACETAMINOPHEN 7.5-325 MG PO TABS
1.0000 | ORAL_TABLET | ORAL | Status: DC
  Administered 2016-04-24 – 2016-04-25 (×6): 2 via ORAL
  Filled 2016-04-24 (×7): qty 2

## 2016-04-24 MED ORDER — PROPOFOL 10 MG/ML IV BOLUS
INTRAVENOUS | Status: AC
  Filled 2016-04-24: qty 20

## 2016-04-24 MED ORDER — ASPIRIN 81 MG PO CHEW
81.0000 mg | CHEWABLE_TABLET | Freq: Two times a day (BID) | ORAL | Status: DC
  Administered 2016-04-25: 81 mg via ORAL
  Filled 2016-04-24 (×2): qty 1

## 2016-04-24 MED ORDER — ALUM & MAG HYDROXIDE-SIMETH 200-200-20 MG/5ML PO SUSP: 30.0000 mL | ORAL | Status: DC | PRN

## 2016-04-24 MED ORDER — 0.9 % SODIUM CHLORIDE (POUR BTL) OPTIME
TOPICAL | Status: DC | PRN
  Administered 2016-04-24: 1000 mL

## 2016-04-24 MED ORDER — BISACODYL 10 MG RE SUPP: 10.0000 mg | Freq: Every day | RECTAL | Status: DC | PRN

## 2016-04-24 MED ORDER — ONDANSETRON HCL 4 MG PO TABS: 4.0000 mg | ORAL_TABLET | Freq: Four times a day (QID) | ORAL | Status: DC | PRN

## 2016-04-24 MED ORDER — METHOCARBAMOL 1000 MG/10ML IJ SOLN
500.0000 mg | Freq: Four times a day (QID) | INTRAVENOUS | Status: DC | PRN
  Administered 2016-04-24 – 2016-04-25 (×3): 500 mg via INTRAVENOUS
  Filled 2016-04-24: qty 550
  Filled 2016-04-24: qty 5
  Filled 2016-04-24: qty 550
  Filled 2016-04-24 (×2): qty 5

## 2016-04-24 MED ORDER — DEXTROSE 5 % IV SOLN
3.0000 g | INTRAVENOUS | Status: AC
  Administered 2016-04-24: 3 g via INTRAVENOUS
  Filled 2016-04-24: qty 3

## 2016-04-24 MED ORDER — FENTANYL CITRATE (PF) 100 MCG/2ML IJ SOLN
INTRAMUSCULAR | Status: AC
  Filled 2016-04-24: qty 2

## 2016-04-24 MED ORDER — HYDROMORPHONE HCL 1 MG/ML IJ SOLN
0.5000 mg | INTRAMUSCULAR | Status: DC | PRN
  Administered 2016-04-24: 1 mg via INTRAVENOUS
  Administered 2016-04-24: 2 mg via INTRAVENOUS
  Administered 2016-04-25: 1 mg via INTRAVENOUS
  Administered 2016-04-25: 2 mg via INTRAVENOUS
  Filled 2016-04-24 (×2): qty 1
  Filled 2016-04-24: qty 2
  Filled 2016-04-24 (×2): qty 1

## 2016-04-24 MED ORDER — PROPOFOL 10 MG/ML IV BOLUS
INTRAVENOUS | Status: AC
  Filled 2016-04-24: qty 60

## 2016-04-24 MED ORDER — LIDOCAINE 2% (20 MG/ML) 5 ML SYRINGE
INTRAMUSCULAR | Status: DC | PRN
  Administered 2016-04-24: 60 mg via INTRAVENOUS

## 2016-04-24 MED ORDER — MAGNESIUM CITRATE PO SOLN: 1.0000 | Freq: Once | ORAL | Status: DC | PRN

## 2016-04-24 MED ORDER — PHENOL 1.4 % MT LIQD
1.0000 | OROMUCOSAL | Status: DC | PRN
Start: 2016-04-24 — End: 2016-04-25
  Filled 2016-04-24: qty 177

## 2016-04-24 MED ORDER — METOCLOPRAMIDE HCL 5 MG/ML IJ SOLN: 5.0000 mg | Freq: Three times a day (TID) | INTRAMUSCULAR | Status: DC | PRN

## 2016-04-24 MED ORDER — LACTATED RINGERS IV SOLN
INTRAVENOUS | Status: DC
  Administered 2016-04-24: 13:00:00 via INTRAVENOUS
  Administered 2016-04-24: 1000 mL via INTRAVENOUS

## 2016-04-24 MED ORDER — AMLODIPINE BESYLATE 10 MG PO TABS
10.0000 mg | ORAL_TABLET | Freq: Every day | ORAL | Status: DC
  Filled 2016-04-24: qty 1

## 2016-04-24 MED ORDER — MIDAZOLAM HCL 2 MG/2ML IJ SOLN
INTRAMUSCULAR | Status: AC
  Filled 2016-04-24: qty 2

## 2016-04-24 MED ORDER — TRANEXAMIC ACID 1000 MG/10ML IV SOLN
1000.0000 mg | INTRAVENOUS | Status: AC
  Administered 2016-04-24: 1000 mg via INTRAVENOUS
  Filled 2016-04-24: qty 1100

## 2016-04-24 MED ORDER — FERROUS SULFATE 325 (65 FE) MG PO TABS
325.0000 mg | ORAL_TABLET | Freq: Three times a day (TID) | ORAL | Status: DC
  Administered 2016-04-25: 325 mg via ORAL
  Filled 2016-04-24 (×2): qty 1

## 2016-04-24 MED ORDER — ONDANSETRON HCL 4 MG/2ML IJ SOLN
INTRAMUSCULAR | Status: DC | PRN
  Administered 2016-04-24: 4 mg via INTRAVENOUS

## 2016-04-24 MED ORDER — SODIUM CHLORIDE 0.9 % IR SOLN
Status: DC | PRN
  Administered 2016-04-24: 1000 mL

## 2016-04-24 MED ORDER — FENTANYL CITRATE (PF) 100 MCG/2ML IJ SOLN
INTRAMUSCULAR | Status: DC | PRN
  Administered 2016-04-24: 100 ug via INTRAVENOUS

## 2016-04-24 MED ORDER — MENTHOL 3 MG MT LOZG: 1.0000 | LOZENGE | OROMUCOSAL | Status: DC | PRN

## 2016-04-24 MED ORDER — SODIUM CHLORIDE 0.9 % IJ SOLN
INTRAMUSCULAR | Status: AC
  Filled 2016-04-24: qty 50

## 2016-04-24 MED ORDER — METOCLOPRAMIDE HCL 5 MG PO TABS: 5.0000 mg | ORAL_TABLET | Freq: Three times a day (TID) | ORAL | Status: DC | PRN

## 2016-04-24 MED ORDER — LIDOCAINE HCL (CARDIAC) 20 MG/ML IV SOLN
INTRAVENOUS | Status: AC
  Filled 2016-04-24: qty 5

## 2016-04-24 MED ORDER — KETOROLAC TROMETHAMINE 30 MG/ML IJ SOLN
INTRAMUSCULAR | Status: DC | PRN
  Administered 2016-04-24: 30 mg

## 2016-04-24 MED ORDER — DEXAMETHASONE SODIUM PHOSPHATE 10 MG/ML IJ SOLN
INTRAMUSCULAR | Status: AC
  Filled 2016-04-24: qty 1

## 2016-04-24 MED ORDER — ONDANSETRON HCL 4 MG/2ML IJ SOLN
INTRAMUSCULAR | Status: AC
  Filled 2016-04-24: qty 2

## 2016-04-24 MED ORDER — METHOCARBAMOL 500 MG PO TABS
500.0000 mg | ORAL_TABLET | Freq: Four times a day (QID) | ORAL | Status: DC | PRN
  Administered 2016-04-24 – 2016-04-25 (×2): 500 mg via ORAL
  Filled 2016-04-24 (×3): qty 1

## 2016-04-24 SURGICAL SUPPLY — 44 items
BAG ZIPLOCK 12X15 (MISCELLANEOUS) ×2 IMPLANT
BANDAGE ACE 6X5 VEL STRL LF (GAUZE/BANDAGES/DRESSINGS) ×2 IMPLANT
BLADE SAW SGTL 13.0X1.19X90.0M (BLADE) ×2 IMPLANT
BOWL SMART MIX CTS (DISPOSABLE) ×2 IMPLANT
CAPT KNEE TOTAL 3 ATTUNE ×2 IMPLANT
CEMENT HV SMART SET (Cement) ×4 IMPLANT
CLOTH BEACON ORANGE TIMEOUT ST (SAFETY) ×2 IMPLANT
CUFF TOURN SGL QUICK 34 (TOURNIQUET CUFF) ×1
CUFF TRNQT CYL 34X4X40X1 (TOURNIQUET CUFF) ×1 IMPLANT
DRAPE U-SHAPE 47X51 STRL (DRAPES) ×2 IMPLANT
DRESSING AQUACEL AG SP 3.5X10 (GAUZE/BANDAGES/DRESSINGS) ×1 IMPLANT
DRSG AQUACEL AG SP 3.5X10 (GAUZE/BANDAGES/DRESSINGS) ×2
DURAPREP 26ML APPLICATOR (WOUND CARE) ×4 IMPLANT
ELECT REM PT RETURN 9FT ADLT (ELECTROSURGICAL) ×2
ELECTRODE REM PT RTRN 9FT ADLT (ELECTROSURGICAL) ×1 IMPLANT
GLOVE BIO SURGEON STRL SZ 6.5 (GLOVE) ×2 IMPLANT
GLOVE BIOGEL PI IND STRL 7.0 (GLOVE) ×1 IMPLANT
GLOVE BIOGEL PI IND STRL 7.5 (GLOVE) ×4 IMPLANT
GLOVE BIOGEL PI IND STRL 8.5 (GLOVE) ×1 IMPLANT
GLOVE BIOGEL PI INDICATOR 7.0 (GLOVE) ×1
GLOVE BIOGEL PI INDICATOR 7.5 (GLOVE) ×4
GLOVE BIOGEL PI INDICATOR 8.5 (GLOVE) ×1
GLOVE ECLIPSE 8.0 STRL XLNG CF (GLOVE) ×4 IMPLANT
GLOVE ORTHO TXT STRL SZ7.5 (GLOVE) ×4 IMPLANT
GLOVE SURG SS PI 7.5 STRL IVOR (GLOVE) ×2 IMPLANT
GOWN STRL REUS W/TWL LRG LVL3 (GOWN DISPOSABLE) ×4 IMPLANT
GOWN STRL REUS W/TWL XL LVL3 (GOWN DISPOSABLE) ×4 IMPLANT
HANDPIECE INTERPULSE COAX TIP (DISPOSABLE) ×1
LIQUID BAND (GAUZE/BANDAGES/DRESSINGS) ×2 IMPLANT
MANIFOLD NEPTUNE II (INSTRUMENTS) ×2 IMPLANT
PACK TOTAL KNEE CUSTOM (KITS) ×2 IMPLANT
POSITIONER SURGICAL ARM (MISCELLANEOUS) ×2 IMPLANT
SET HNDPC FAN SPRY TIP SCT (DISPOSABLE) ×1 IMPLANT
SET PAD KNEE POSITIONER (MISCELLANEOUS) ×2 IMPLANT
SUT MNCRL AB 4-0 PS2 18 (SUTURE) ×2 IMPLANT
SUT VIC AB 1 CT1 36 (SUTURE) ×2 IMPLANT
SUT VIC AB 2-0 CT1 27 (SUTURE) ×3
SUT VIC AB 2-0 CT1 TAPERPNT 27 (SUTURE) ×3 IMPLANT
SUT VLOC 180 0 24IN GS25 (SUTURE) ×2 IMPLANT
SYR 50ML LL SCALE MARK (SYRINGE) ×2 IMPLANT
TRAY FOLEY W/METER SILVER 14FR (SET/KITS/TRAYS/PACK) ×2 IMPLANT
TRAY FOLEY W/METER SILVER 16FR (SET/KITS/TRAYS/PACK) IMPLANT
WRAP KNEE MAXI GEL POST OP (GAUZE/BANDAGES/DRESSINGS) ×2 IMPLANT
YANKAUER SUCT BULB TIP 10FT TU (MISCELLANEOUS) ×2 IMPLANT

## 2016-04-24 NOTE — Progress Notes (Signed)
O.K. For patient to go to floor if awake- does not have to stay in PACU 2 hours- per Dr. Sherron AlesK. Jackson

## 2016-04-24 NOTE — Anesthesia Preprocedure Evaluation (Signed)
Anesthesia Evaluation  Patient identified by MRN, date of birth, ID band Patient awake    Reviewed: Allergy & Precautions, H&P , Patient's Chart, lab work & pertinent test results  Airway Mallampati: II  TM Distance: >3 FB Neck ROM: full    Dental no notable dental hx.    Pulmonary former smoker,    Pulmonary exam normal breath sounds clear to auscultation       Cardiovascular Exercise Tolerance: Good hypertension,  Rhythm:regular Rate:Normal     Neuro/Psych    GI/Hepatic   Endo/Other    Renal/GU      Musculoskeletal   Abdominal   Peds  Hematology   Anesthesia Other Findings Hypertension; controlled. Recent EF 60% on echo   Hyperlipidemia   Arthritis    Sleep apnea   SBO (small bowel obstruction)   a. 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  Ascending aortic aneurysm ( 10/2015 CTA chest: 4.4cm aneurysmal. Pneumonia         Reproductive/Obstetrics                             Anesthesia Physical Anesthesia Plan  ASA: III  Anesthesia Plan: Spinal   Post-op Pain Management:    Induction:   Airway Management Planned:   Additional Equipment:   Intra-op Plan:   Post-operative Plan:   Informed Consent: I have reviewed the patients History and Physical, chart, labs and discussed the procedure including the risks, benefits and alternatives for the proposed anesthesia with the patient or authorized representative who has indicated his/her understanding and acceptance.   Dental Advisory Given  Plan Discussed with: CRNA  Anesthesia Plan Comments: (Lab work confirmed with CRNA in room. Platelets okay. Discussed spinal anesthetic, and patient consents to the procedure:  included risk of possible headache,backache, failed block, allergic reaction, and nerve injury. This patient was asked if she had any questions or concerns before the procedure started. )        Anesthesia Quick Evaluation

## 2016-04-24 NOTE — Evaluation (Signed)
Physical Therapy Evaluation Patient Details Name: Keith CockingRoy A Rossini MRN: [id number] DOB: November 28, 1960 Today's Date: 04/24/2016   History of Present Illness  L TKA  Clinical Impression  The patient is mobilizing very well. Plans for DC tomorrow, ASAP. Will need a RW. Pt admitted with above diagnosis. Pt currently with functional limitations due to the deficits listed below (see PT Problem List). Pt will benefit from skilled PT to increase their independence and safety with mobility to allow discharge to the venue listed below.       Follow Up Recommendations Home health PT    Equipment Recommendations  Rolling walker with 5" wheels    Recommendations for Other Services       Precautions / Restrictions Precautions Precautions: Knee;Fall      Mobility  Bed Mobility Overal bed mobility: Needs Assistance Bed Mobility: Supine to Sit     Supine to sit: Min guard     General bed mobility comments: cues for technique  Transfers Overall transfer level: Needs assistance Equipment used: Rolling walker (2 wheeled) Transfers: Sit to/from Stand Sit to Stand: Min guard         General transfer comment: cues for hand and left leg position  Ambulation/Gait Ambulation/Gait assistance: Min guard Ambulation Distance (Feet): 100 Feet Assistive device: Rolling walker (2 wheeled) Gait Pattern/deviations: Step-to pattern;Step-through pattern     General Gait Details: cues for sequence and step length  Stairs            Wheelchair Mobility    Modified Rankin (Stroke Patients Only)       Balance                                             Pertinent Vitals/Pain Pain Assessment: 0-10 Pain Score: 7  Pain Location: L knee Pain Descriptors / Indicators: Tightness Pain Intervention(s): Limited activity within patient's tolerance;Monitored during session;Premedicated before session;Repositioned;Ice applied    Home Living Family/patient expects to be  discharged to:: Private residence Living Arrangements: Spouse/significant other Available Help at Discharge: Family Type of Home: House Home Access: Ramped entrance     Home Layout: One level Home Equipment: Cane - single point;Grab bars - tub/shower;Grab bars - toilet      Prior Function Level of Independence: Independent with assistive device(s)               Hand Dominance        Extremity/Trunk Assessment   Upper Extremity Assessment: Defer to OT evaluation           Lower Extremity Assessment: LLE deficits/detail   LLE Deficits / Details: 10-60 flexion, ++ SLR  Cervical / Trunk Assessment: Normal  Communication   Communication: No difficulties  Cognition Arousal/Alertness: Awake/alert Behavior During Therapy: WFL for tasks assessed/performed Overall Cognitive Status: Within Functional Limits for tasks assessed                      General Comments      Exercises        Assessment/Plan    PT Assessment Patient needs continued PT services  PT Diagnosis Difficulty walking;Acute pain   PT Problem List Decreased strength;Decreased range of motion;Decreased activity tolerance;Decreased mobility;Decreased knowledge of precautions;Decreased safety awareness;Decreased knowledge of use of DME;Pain  PT Treatment Interventions DME instruction;Gait training;Functional mobility training;Therapeutic activities;Therapeutic exercise;Patient/family education   PT Goals (Current goals can be found  in the Care Plan section) Acute Rehab PT Goals Patient Stated Goal: to go home tonight PT Goal Formulation: With patient/family Time For Goal Achievement: 04/26/16 Potential to Achieve Goals: Good    Frequency 7X/week   Barriers to discharge        Co-evaluation               End of Session   Activity Tolerance: Patient tolerated treatment well Patient left: in chair;with call bell/phone within reach;with family/visitor present;with chair alarm  set Nurse Communication: Mobility status         Time: 7564-3329 PT Time Calculation (min) (ACUTE ONLY): 15 min   Charges:   PT Evaluation $PT Eval Low Complexity: 1 Procedure     PT G CodesRada Hay 04/24/2016, 6:01 PM

## 2016-04-24 NOTE — Anesthesia Postprocedure Evaluation (Signed)
Anesthesia Post Note  Patient: Keith Vance  Procedure(s) Performed: Procedure(s) (LRB): LEFT TOTAL KNEE ARTHROPLASTY (Left)  Patient location during evaluation: PACU Anesthesia Type: Spinal Level of consciousness: awake Pain management: satisfactory to patient Vital Signs Assessment: post-procedure vital signs reviewed and stable Respiratory status: spontaneous breathing Cardiovascular status: blood pressure returned to baseline Postop Assessment: no headache and spinal receding Anesthetic complications: no    Last Vitals:  Vitals:   04/24/16 1506 04/24/16 1604  BP: 128/85 (!) 145/81  Pulse: 66 68  Resp: 14 20  Temp: 36.8 C 37.2 C    Last Pain:  Vitals:   04/24/16 1604  TempSrc: Axillary  PainSc:                  Jiles GarterJACKSON,[name]

## 2016-04-24 NOTE — Op Note (Signed)
NAME:  Keith Vance                      MEDICAL RECORD NO.:  [id number]                             FACILITY:  [facility]      PHYSICIAN:  Madlyn Frankel. Charlann Boxer, M.D.  DATE OF BIRTH:  Dec 18, 1960      DATE OF PROCEDURE:  04/24/2016                                     OPERATIVE REPORT         PREOPERATIVE DIAGNOSIS:  Left knee osteoarthritis.      POSTOPERATIVE DIAGNOSIS:  Left knee osteoarthritis.      FINDINGS:  The patient was noted to have complete loss of cartilage and   bone-on-bone arthritis with associated osteophytes in the medial and patellofemoral compartments of   the knee with a significant synovitis and associated effusion.      PROCEDURE:  Left total knee replacement.      COMPONENTS USED:  DePuy Attune rotating platform posterior stabilized knee   system, a size 5 femur, 6 tibia, size 5 PS AOX insert, and 38 anatomic patellar   button.      SURGEON:  Madlyn Frankel. Charlann Boxer, M.D.      ASSISTANT:  Lanney Gins, PA-C.      ANESTHESIA:  Spinal.      SPECIMENS:  None.      COMPLICATION:  None.      DRAINS:  None.  EBL: <50cc      TOURNIQUET TIME:   Total Tourniquet Time Documented: Thigh (Left) - 37 minutes Total: Thigh (Left) - 37 minutes  .      The patient was stable to the recovery room.      INDICATION FOR PROCEDURE:  Keith Vance is a 55 y.o. male patient of   mine.  The patient had been seen, evaluated, and treated conservatively in the   office with medication, activity modification, and injections.  The patient had   radiographic changes of bone-on-bone arthritis with endplate sclerosis and osteophytes noted.      The patient failed conservative measures including medication, injections, and activity modification, and at this point was ready for more definitive measures.   Based on the radiographic changes and failed conservative measures, the patient   decided to proceed with total knee replacement.  Risks of infection,   DVT, component failure, need for  revision surgery, postop course, and   expectations were all   discussed and reviewed.  Consent was obtained for benefit of pain   relief.      PROCEDURE IN DETAIL:  The patient was brought to the operative theater.   Once adequate anesthesia, preoperative antibiotics, 2 gm of Ancef, 1 gm of Tranexamic Acid, and 10 mg of Decadron administered, the patient was positioned supine with the left thigh tourniquet placed.  The  left lower extremity was prepped and draped in sterile fashion.  A time-   out was performed identifying the patient, planned procedure, and   extremity.      The left lower extremity was placed in the [facility] leg holder.  The leg was   exsanguinated, tourniquet elevated to 250 mmHg.  A midline incision was   made followed  by median parapatellar arthrotomy.  Following initial   exposure, attention was first directed to the patella.  Precut   measurement was noted to be 24 mm.  I resected down to 14 mm and used a   38 anatomic patellar button to restore patellar height as well as cover the cut   surface.      The lug holes were drilled and a metal shim was placed to protect the   patella from retractors and saw blades.      At this point, attention was now directed to the femur.  The femoral   canal was opened with a drill, irrigated to try to prevent fat emboli.  An   intramedullary rod was passed at 5 degrees valgus, 9 mm of bone was   resected off the distal femur.  Following this resection, the tibia was   subluxated anteriorly.  Using the extramedullary guide, 2 mm of bone was resected off   the proximal medial tibia.  We confirmed the gap would be   stable medially and laterally with a size 5 insert as well as confirmed   the cut was perpendicular in the coronal plane, checking with an alignment rod.      Once this was done, I sized the femur to be a size 5 in the anterior-   posterior dimension, chose a standard component based on medial and   lateral dimension.   The size 5 rotation block was then pinned in   position anterior referenced using the C-clamp to set rotation.  The   anterior, posterior, and  chamfer cuts were made without difficulty nor   notching making certain that I was along the anterior cortex to help   with flexion gap stability.      The final box cut was made off the lateral aspect of distal femur.      At this point, the tibia was sized to be a size 6, the size 6 tray was   then pinned in position through the medial third of the tubercle,   drilled, and keel punched.  Trial reduction was now carried with a 5 femur,  6 tibia, a size 5 PS insert, and the 38 anatomic patella botton.  The knee was brought to   extension, full extension with good flexion stability with the patella   tracking through the trochlea without application of pressure.  Given   all these findings the femoral lug holes were drilled and then the trial components removed.  Final components were   opened and cement was mixed.  The knee was irrigated with normal saline   solution and pulse lavage.  The synovial lining was   then injected with 30 cc of 0.25% Marcaine with epinephrine and 1 cc of Toradol plus 30 cc of NS  total of 61 cc.      The knee was irrigated.  Final implants were then cemented onto clean and   dried cut surfaces of bone with the knee brought to extension with a size 5 trial insert.      Once the cement had fully cured, the excess cement was removed   throughout the knee.  I confirmed I was satisfied with the range of   motion and stability, and the final size 5 PS AOX insert was chosen.  It was   placed into the knee.      The tourniquet had been let down at 37 minutes.  No significant   hemostasis  required.  The   extensor mechanism was then reapproximated using #1 Vicryl with the knee   in flexion.  The   remaining wound was closed with 2-0 Vicryl and running 4-0 Monocryl.   The knee was cleaned, dried, dressed sterilely using  Dermabond and   Aquacel dressing.  The patient was then   brought to recovery room in stable condition, tolerating the procedure   well.   Please note that Physician Assistant, Brion Aliment was present for the entirety of the case, and was utilized for pre-operative positioning, peri-operative retractor management, general facilitation of the procedure.  He was also utilized for primary wound closure at the end of the case.              Madlyn Frankel Charlann Boxer, M.D.    04/24/2016 1:38 PM

## 2016-04-24 NOTE — Transfer of Care (Signed)
Immediate Anesthesia Transfer of Care Note  Patient: Keith Vance  Procedure(s) Performed: Procedure(s): LEFT TOTAL KNEE ARTHROPLASTY (Left)  Patient Location: PACU  Anesthesia Type:Spinal  Level of Consciousness:  sedated, patient cooperative and responds to stimulation  Airway & Oxygen Therapy:Patient Spontanous Breathing and Patient connected to face mask oxgen  Post-op Assessment:  Report given to PACU RN and Post -op Vital signs reviewed and stable  Post vital signs:  Reviewed and stable  Last Vitals:  Vitals:   04/24/16 0930  BP: (!) 151/101  Pulse: 68  Temp: 36.6 C    Complications: No apparent anesthesia complications

## 2016-04-24 NOTE — Anesthesia Procedure Notes (Addendum)
Spinal  Patient location during procedure: OR End time: 04/24/2016 11:51 AM Staffing Resident/CRNA: Noralyn Pick D Performed: anesthesiologist  Preanesthetic Checklist Completed: patient identified, site marked, surgical consent, pre-op evaluation, timeout performed, IV checked, risks and benefits discussed and monitors and equipment checked Spinal Block Patient position: sitting Prep: Betadine Patient monitoring: heart rate, continuous pulse ox and blood pressure Approach: midline Location: L3-4 Injection technique: single-shot Needle Needle type: Sprotte and Pencan  Needle gauge: 24 G Needle length: 9 cm Additional Notes Expiration date of kit checked and confirmed. Patient tolerated procedure well, without complications.

## 2016-04-24 NOTE — Interval H&P Note (Signed)
History and Physical Interval Note:  04/24/2016 10:55 AM  Keith Vance  has presented today for surgery, with the diagnosis of LEFT KNEE OA  The various methods of treatment have been discussed with the patient and family. After consideration of risks, benefits and other options for treatment, the patient has consented to  Procedure(s): LEFT TOTAL KNEE ARTHROPLASTY (Left) as a surgical intervention .  The patient's history has been reviewed, patient examined, no change in status, stable for surgery.  I have reviewed the patient's chart and labs.  Questions were answered to the patient's satisfaction.     Shelda PalLIN,Daniyla Pfahler D

## 2016-04-25 LAB — CBC
HCT: 40.6 % (ref 39.0–52.0)
Hemoglobin: 14.3 g/dL (ref 13.0–17.0)
MCH: 32.1 pg (ref 26.0–34.0)
MCHC: 35.2 g/dL (ref 30.0–36.0)
MCV: 91 fL (ref 78.0–100.0)
Platelets: 154 10*3/uL (ref 150–400)
RBC: 4.46 MIL/uL (ref 4.22–5.81)
RDW: 12.3 % (ref 11.5–15.5)
WBC: 11.8 10*3/uL — ABNORMAL HIGH (ref 4.0–10.5)

## 2016-04-25 LAB — BASIC METABOLIC PANEL
Anion gap: 8 (ref 5–15)
BUN: 16 mg/dL (ref 6–20)
CO2: 24 mmol/L (ref 22–32)
Calcium: 8.5 mg/dL — ABNORMAL LOW (ref 8.9–10.3)
Chloride: 101 mmol/L (ref 101–111)
Creatinine, Ser: 0.84 mg/dL (ref 0.61–1.24)
GFR calc Af Amer: >60 mL/min (ref >60)
GFR calc non Af Amer: >60 mL/min (ref >60)
Glucose, Bld: 140 mg/dL — ABNORMAL HIGH (ref 65–99)
Potassium: 4.1 mmol/L (ref 3.5–5.1)
Sodium: 133 mmol/L — ABNORMAL LOW (ref 135–145)

## 2016-04-25 MED ORDER — CELECOXIB 200 MG PO CAPS: 200.0000 mg | ORAL_CAPSULE | Freq: Two times a day (BID) | ORAL | 0 refills | Status: DC

## 2016-04-25 MED ORDER — HYDROCODONE-ACETAMINOPHEN 7.5-325 MG PO TABS: 1.0000 | ORAL_TABLET | ORAL | 0 refills | Status: DC | PRN

## 2016-04-25 MED ORDER — FERROUS SULFATE 325 (65 FE) MG PO TABS: 325.0000 mg | ORAL_TABLET | Freq: Three times a day (TID) | ORAL | 3 refills | Status: DC

## 2016-04-25 MED ORDER — ASPIRIN 81 MG PO CHEW: 81.0000 mg | CHEWABLE_TABLET | Freq: Two times a day (BID) | ORAL | 0 refills | Status: AC

## 2016-04-25 MED ORDER — POLYETHYLENE GLYCOL 3350 17 G PO PACK: 17.0000 g | PACK | Freq: Two times a day (BID) | ORAL | 0 refills | Status: DC

## 2016-04-25 MED ORDER — DOCUSATE SODIUM 100 MG PO CAPS: 100.0000 mg | ORAL_CAPSULE | Freq: Two times a day (BID) | ORAL | 0 refills | Status: DC

## 2016-04-25 MED ORDER — METHOCARBAMOL 500 MG PO TABS: 500.0000 mg | ORAL_TABLET | Freq: Four times a day (QID) | ORAL | 0 refills | Status: DC | PRN

## 2016-04-25 NOTE — Care Management Note (Signed)
Case Management Note  Patient Details  Name: Keith Vance MRN: [id number] Date of Birth: 17-Mar-1961  Subjective/Objective:   55 y/o m admitted w/OA L knee. [facility]p [name].PT recc [facility] rep [name] aware. Rw.Spoke to patient about d/c plans [facility] dme rep has already brought rw to rm. No further CM needs.                Action/Plan:d/c home w/HHC/dme.   Expected Discharge Date:                Expected Discharge Plan:  Home w Home Health Services  In-House Referral:     Discharge planning Services  CM Consult  Post Acute Care Choice:  Home Health [name]([facility] PTA from office) Choice offered to:  Patient  DME Arranged:  Walker rolling DME Agency:  [facility]  HH Arranged:    HH [facility]  Status of Service:     If discussed at [employer] Length of Stay Meetings, dates discussed:    Additional Comments:  Lanier ClamMahabir, Josaiah Muhammed, RN 04/25/2016, 10:55 AM

## 2016-04-25 NOTE — Progress Notes (Signed)
Occupational Therapy Evaluation Patient Details Name: Keith CockingRoy A Keirsey MRN: [id number] DOB: 28-Aug-1961 Today's Date: 04/25/2016    History of Present Illness L TKA   Clinical Impression   All OT education completed and pt questions answered. No further OT needs at this time. Will sign off.    Follow Up Recommendations  No OT follow up    Equipment Recommendations  None recommended by OT    Recommendations for Other Services       Precautions / Restrictions Precautions Precautions: Knee;Fall Restrictions Weight Bearing Restrictions: No Other Position/Activity Restrictions: WBAT      Mobility Bed Mobility Overal bed mobility: Needs Assistance Bed Mobility: Supine to Sit     Supine to sit: Supervision        Transfers Overall transfer level: Needs assistance Equipment used: Rolling walker (2 wheeled) Transfers: Sit to/from Stand Sit to Stand: Supervision              Balance                                            ADL Overall ADL's : Needs assistance/impaired Eating/Feeding: Independent;Sitting   Grooming: Set up;Sitting           Upper Body Dressing : Set up;Sitting   Lower Body Dressing: Minimal assistance;Sit to/from stand   Toilet Transfer: Supervision/safety;Min guard;Ambulation;Comfort height toilet;Grab bars;RW   Toileting- Clothing Manipulation and Hygiene: Supervision/safety;Sit to/from Nurse, children'sstand     Tub/Shower Transfer Details (indicate cue type and reason): pt denies need to practice; verbal education provided Functional mobility during ADLs: Supervision/safety;Rolling walker General ADL Comments: Patient has handicapped accessible bathroom from previous owner's needs. Grab bars and handheld shower head in shower; grab bars near handicapped height toilet. Verbal education on shower transfer technique but pt declined to practice. Patient did get dressed and practiced toilet transfer/toileting during session.       Vision     Perception     Praxis      Pertinent Vitals/Pain Pain Assessment: 0-10 Pain Score: 3  Pain Location: L knee Pain Descriptors / Indicators: Aching;Sore Pain Intervention(s): Limited activity within patient's tolerance;Monitored during session;Repositioned;Ice applied     Hand Dominance     Extremity/Trunk Assessment Upper Extremity Assessment Upper Extremity Assessment: Overall WFL for tasks assessed   Lower Extremity Assessment Lower Extremity Assessment: Defer to PT evaluation   Cervical / Trunk Assessment Cervical / Trunk Assessment: Normal   Communication Communication Communication: No difficulties   Cognition Arousal/Alertness: Awake/alert Behavior During Therapy: WFL for tasks assessed/performed Overall Cognitive Status: Within Functional Limits for tasks assessed                     General Comments       Exercises       Shoulder Instructions      Home Living Family/patient expects to be discharged to:: Private residence Living Arrangements: Spouse/significant other Available Help at Discharge: Family Type of Home: House Home Access: Ramped entrance     Home Layout: One level     Bathroom Shower/Tub: Producer, television/film/videoWalk-in shower   Bathroom Toilet: Handicapped height Bathroom Accessibility: Yes How Accessible: Accessible via walker Home Equipment: Cane - single point;Grab bars - tub/shower;Grab bars - toilet;Hand held shower head          Prior Functioning/Environment Level of Independence: Independent with assistive device(s)  OT Diagnosis: Acute pain   OT Problem List: Decreased strength;Decreased range of motion;Decreased knowledge of use of DME or AE;Pain   OT Treatment/Interventions:      OT Goals(Current goals can be found in the care plan section) Acute Rehab OT Goals Patient Stated Goal: home today OT Goal Formulation: All assessment and education complete, DC therapy  OT Frequency:     Barriers to  D/C:            Co-evaluation              End of Session Equipment Utilized During Treatment: Rolling walker Nurse Communication: Mobility status  Activity Tolerance: Patient tolerated treatment well Patient left: in chair;with call bell/phone within reach;with bed alarm set;with family/visitor present   Time: 0905-0920 OT Time Calculation (min): 15 min Charges:  OT General Charges $OT Visit: 1 Procedure OT Evaluation $OT Eval Low Complexity: 1 Procedure G-Codes:    Laurelin Elson A 05-23-16, 11:06 AM

## 2016-04-25 NOTE — Discharge Instructions (Signed)

## 2016-04-25 NOTE — Progress Notes (Signed)
Patient ID: Kandis CockingRoy A Mink, male   DOB: 1960-10-31, 55 y.o.   MRN: [id number] Subjective: 1 Day Post-Op Procedure(s) (LRB): LEFT TOTAL KNEE ARTHROPLASTY (Left)    Patient reports pain as mild to moderate.  No events.  Wanted [employer]!  Objective:   VITALS:   Vitals:   04/25/16 0200 04/25/16 0600  BP: 128/85 130/89  Pulse: 69 65  Resp: 18 20  Temp: 98 F (36.7 C) 98 F (36.7 C)    Neurovascular intact Incision: dressing C/D/I  LABS  Recent Labs  04/25/16 0423  HGB 14.3  HCT 40.6  WBC 11.8*  PLT 154     Recent Labs  04/25/16 0423  NA 133*  K 4.1  BUN 16  CREATININE 0.84  GLUCOSE 140*    No results for input(s): LABPT, INR in the last 72 hours.   Assessment/Plan: 1 Day Post-Op Procedure(s) (LRB): LEFT TOTAL KNEE ARTHROPLASTY (Left)   Advance diet Up with therapy Discharge home with home health  RTC in 2 weeks

## 2016-04-25 NOTE — Progress Notes (Signed)
Physical Therapy Treatment Patient Details Name: Keith Vance MRN: [id number] DOB: 02-09-1961 Today's Date: 04/25/2016    History of Present Illness L TKA    PT Comments    POD # 1 am session. Pt just vomited all his meds and breakfast.  Now pt feels better.  Assisted with amb a great distance in hallway then returned to room to perform TKR TE's following HEP handout.  Instructed on proper tech and freg as well as use of ICE.   Follow Up Recommendations  Home health PT     Equipment Recommendations  Rolling walker with 5" wheels    Recommendations for Other Services       Precautions / Restrictions Precautions Precautions: Knee;Fall Restrictions Weight Bearing Restrictions: No Other Position/Activity Restrictions: WBAT    Mobility  Bed Mobility Overal bed mobility: Needs Assistance Bed Mobility: Supine to Sit     Supine to sit: Supervision     General bed mobility comments: Pt OOB in recliner  Transfers Overall transfer level: Needs assistance Equipment used: Rolling walker (2 wheeled) Transfers: Sit to/from Stand Sit to Stand: Supervision         General transfer comment: cues for hand and left leg position  Ambulation/Gait Ambulation/Gait assistance: Supervision Ambulation Distance (Feet): 120 Feet Assistive device: Rolling walker (2 wheeled) Gait Pattern/deviations: Step-to pattern;Step-through pattern Gait velocity: decreased   General Gait Details: <25% VC's on safety with turns and proper walker to self distance.    Stairs            Wheelchair Mobility    Modified Rankin (Stroke Patients Only)       Balance                                    Cognition Arousal/Alertness: Awake/alert Behavior During Therapy: WFL for tasks assessed/performed Overall Cognitive Status: Within Functional Limits for tasks assessed                      Exercises   Total Knee Replacement TE's 10 reps B LE ankle pumps 10  reps towel squeezes 10 reps knee presses 10 reps heel slides  10 reps SAQ's 10 reps SLR's 10 reps ABD Followed by ICE     General Comments        Pertinent Vitals/Pain Pain Assessment: No/denies pain Pain Score: 3  Pain Location: L knee Pain Descriptors / Indicators: Tender;Sore Pain Intervention(s): Monitored during session;Repositioned;Ice applied    Home Living Family/patient expects to be discharged to:: Private residence Living Arrangements: Spouse/significant other Available Help at Discharge: Family Type of Home: House Home Access: Ramped entrance   Home Layout: One level Home Equipment: Cane - single point;Grab bars - tub/shower;Grab bars - toilet;Hand held shower head      Prior Function Level of Independence: Independent with assistive device(s)          PT Goals (current goals can now be found in the care plan section) Acute Rehab PT Goals Patient Stated Goal: home today Progress towards PT goals: Progressing toward goals    Frequency  7X/week    PT Plan Current plan remains appropriate    Co-evaluation             End of Session Equipment Utilized During Treatment: Gait belt Activity Tolerance: Patient tolerated treatment well Patient left: in chair;with call bell/phone within reach;with family/visitor present;with chair alarm set  Time: [id number] PT Time Calculation (min) (ACUTE ONLY): 39 min  Charges:  $Gait Training: 8-22 mins $Therapeutic Exercise: 8-22 mins $Therapeutic Activity: 8-22 mins                    G Codes:      Felecia ShellingLori Diyari Cherne  PTA [facility]  Acute  Rehab Pager      [phone]

## 2016-04-25 NOTE — Progress Notes (Signed)
Physical Therapy Treatment Patient Details Name: Keith Vance MRN: [id number] DOB: [birth year] Today's Date: 04/25/2016    History of Present Illness L TKA    PT Comments    POD # 1 pm session Assisted with amb a greater distance then finished TE's.  Pt ready for D/C to home.  Follow Up Recommendations  Home health PT     Equipment Recommendations  Rolling walker with 5" wheels    Recommendations for Other Services       Precautions / Restrictions Precautions Precautions: Knee;Fall Restrictions Weight Bearing Restrictions: No Other Position/Activity Restrictions: WBAT    Mobility  Bed Mobility Overal bed mobility: Needs Assistance Bed Mobility: Supine to Sit     Supine to sit: Supervision     General bed mobility comments: Pt OOB in recliner  Transfers Overall transfer level: Needs assistance Equipment used: Rolling walker (2 wheeled) Transfers: Sit to/from Stand Sit to Stand: Supervision         General transfer comment: cues for hand and left leg position.  Tolerated increased distance.  Ambulation/Gait Ambulation/Gait assistance: Supervision Ambulation Distance (Feet): 138 Feet Assistive device: Rolling walker (2 wheeled) Gait Pattern/deviations: Step-to pattern;Step-through pattern Gait velocity: decreased   General Gait Details: <25% VC's on safety with turns and proper walker to self distance.    Stairs    no stairs has a ramp        Wheelchair Mobility    Modified Rankin (Stroke Patients Only)       Balance                                    Cognition Arousal/Alertness: Awake/alert Behavior During Therapy: WFL for tasks assessed/performed Overall Cognitive Status: Within Functional Limits for tasks assessed                      Exercises      General Comments        Pertinent Vitals/Pain Pain Assessment: No/denies pain Pain Score: 3  Pain Location: L knee Pain Descriptors / Indicators:  Tender;Sore Pain Intervention(s): Monitored during session;Repositioned;Ice applied    Home Living Family/patient expects to be discharged to:: Private residence Living Arrangements: Spouse/significant other Available Help at Discharge: Family Type of Home: House Home Access: Ramped entrance   Home Layout: One level Home Equipment: Cane - single point;Grab bars - tub/shower;Grab bars - toilet;Hand held shower head      Prior Function Level of Independence: Independent with assistive device(s)          PT Goals (current goals can now be found in the care plan section) Acute Rehab PT Goals Patient Stated Goal: home today Progress towards PT goals: Progressing toward goals    Frequency  7X/week    PT Plan Current plan remains appropriate    Co-evaluation             End of Session Equipment Utilized During Treatment: Gait belt Activity Tolerance: Patient tolerated treatment well Patient left: in chair;with call bell/phone within reach;with family/visitor present;with chair alarm set     Time: 1235-1300 PT Time Calculation (min) (ACUTE ONLY): 25 min  Charges:  $Gait Training: 8-22 mins $Therapeutic Activity: 8-22 mins                    G Codes:      Keith Vance  PTA [facility]  Acute  Rehab Pager  [phone]  

## 2016-04-26 DIAGNOSIS — M1991 Primary osteoarthritis, unspecified site: Secondary | ICD-10-CM | POA: Diagnosis not present

## 2016-04-26 DIAGNOSIS — Z87891 Personal history of nicotine dependence: Secondary | ICD-10-CM | POA: Diagnosis not present

## 2016-04-26 DIAGNOSIS — I712 Thoracic aortic aneurysm, without rupture: Secondary | ICD-10-CM | POA: Diagnosis not present

## 2016-04-26 DIAGNOSIS — Z79891 Long term (current) use of opiate analgesic: Secondary | ICD-10-CM | POA: Diagnosis not present

## 2016-04-26 DIAGNOSIS — G4730 Sleep apnea, unspecified: Secondary | ICD-10-CM | POA: Diagnosis not present

## 2016-04-26 DIAGNOSIS — E785 Hyperlipidemia, unspecified: Secondary | ICD-10-CM | POA: Diagnosis not present

## 2016-04-26 DIAGNOSIS — Z96652 Presence of left artificial knee joint: Secondary | ICD-10-CM | POA: Diagnosis not present

## 2016-04-26 DIAGNOSIS — Z7982 Long term (current) use of aspirin: Secondary | ICD-10-CM | POA: Diagnosis not present

## 2016-04-26 DIAGNOSIS — I10 Essential (primary) hypertension: Secondary | ICD-10-CM | POA: Diagnosis not present

## 2016-04-26 DIAGNOSIS — Z471 Aftercare following joint replacement surgery: Secondary | ICD-10-CM | POA: Diagnosis not present

## 2016-04-28 DIAGNOSIS — Z471 Aftercare following joint replacement surgery: Secondary | ICD-10-CM | POA: Diagnosis not present

## 2016-04-28 DIAGNOSIS — G4730 Sleep apnea, unspecified: Secondary | ICD-10-CM | POA: Diagnosis not present

## 2016-04-28 DIAGNOSIS — I10 Essential (primary) hypertension: Secondary | ICD-10-CM | POA: Diagnosis not present

## 2016-04-28 DIAGNOSIS — Z96652 Presence of left artificial knee joint: Secondary | ICD-10-CM | POA: Diagnosis not present

## 2016-04-28 DIAGNOSIS — M1991 Primary osteoarthritis, unspecified site: Secondary | ICD-10-CM | POA: Diagnosis not present

## 2016-04-28 DIAGNOSIS — I712 Thoracic aortic aneurysm, without rupture: Secondary | ICD-10-CM | POA: Diagnosis not present

## 2016-04-28 DIAGNOSIS — Z87891 Personal history of nicotine dependence: Secondary | ICD-10-CM | POA: Diagnosis not present

## 2016-04-28 DIAGNOSIS — E785 Hyperlipidemia, unspecified: Secondary | ICD-10-CM | POA: Diagnosis not present

## 2016-04-28 DIAGNOSIS — Z79891 Long term (current) use of opiate analgesic: Secondary | ICD-10-CM | POA: Diagnosis not present

## 2016-04-28 DIAGNOSIS — Z7982 Long term (current) use of aspirin: Secondary | ICD-10-CM | POA: Diagnosis not present

## 2016-05-01 DIAGNOSIS — E785 Hyperlipidemia, unspecified: Secondary | ICD-10-CM | POA: Diagnosis not present

## 2016-05-01 DIAGNOSIS — Z79891 Long term (current) use of opiate analgesic: Secondary | ICD-10-CM | POA: Diagnosis not present

## 2016-05-01 DIAGNOSIS — I10 Essential (primary) hypertension: Secondary | ICD-10-CM | POA: Diagnosis not present

## 2016-05-01 DIAGNOSIS — I712 Thoracic aortic aneurysm, without rupture: Secondary | ICD-10-CM | POA: Diagnosis not present

## 2016-05-01 DIAGNOSIS — G4730 Sleep apnea, unspecified: Secondary | ICD-10-CM | POA: Diagnosis not present

## 2016-05-01 DIAGNOSIS — Z7982 Long term (current) use of aspirin: Secondary | ICD-10-CM | POA: Diagnosis not present

## 2016-05-01 DIAGNOSIS — Z87891 Personal history of nicotine dependence: Secondary | ICD-10-CM | POA: Diagnosis not present

## 2016-05-01 DIAGNOSIS — Z96652 Presence of left artificial knee joint: Secondary | ICD-10-CM | POA: Diagnosis not present

## 2016-05-01 DIAGNOSIS — M1991 Primary osteoarthritis, unspecified site: Secondary | ICD-10-CM | POA: Diagnosis not present

## 2016-05-01 DIAGNOSIS — Z471 Aftercare following joint replacement surgery: Secondary | ICD-10-CM | POA: Diagnosis not present

## 2016-05-03 DIAGNOSIS — Z96652 Presence of left artificial knee joint: Secondary | ICD-10-CM | POA: Diagnosis not present

## 2016-05-03 DIAGNOSIS — Z7982 Long term (current) use of aspirin: Secondary | ICD-10-CM | POA: Diagnosis not present

## 2016-05-03 DIAGNOSIS — I712 Thoracic aortic aneurysm, without rupture: Secondary | ICD-10-CM | POA: Diagnosis not present

## 2016-05-03 DIAGNOSIS — Z87891 Personal history of nicotine dependence: Secondary | ICD-10-CM | POA: Diagnosis not present

## 2016-05-03 DIAGNOSIS — Z471 Aftercare following joint replacement surgery: Secondary | ICD-10-CM | POA: Diagnosis not present

## 2016-05-03 DIAGNOSIS — I10 Essential (primary) hypertension: Secondary | ICD-10-CM | POA: Diagnosis not present

## 2016-05-03 DIAGNOSIS — M1991 Primary osteoarthritis, unspecified site: Secondary | ICD-10-CM | POA: Diagnosis not present

## 2016-05-03 DIAGNOSIS — Z79891 Long term (current) use of opiate analgesic: Secondary | ICD-10-CM | POA: Diagnosis not present

## 2016-05-03 DIAGNOSIS — E785 Hyperlipidemia, unspecified: Secondary | ICD-10-CM | POA: Diagnosis not present

## 2016-05-03 DIAGNOSIS — G4730 Sleep apnea, unspecified: Secondary | ICD-10-CM | POA: Diagnosis not present

## 2016-05-07 NOTE — Discharge Summary (Signed)
Physician Discharge Summary  Patient ID: Keith Vance MRN: [id number] DOB/AGE: 1961-03-06 55 y.o.  Admit date: 04/24/2016 Discharge date: 04/25/2016   Procedures:  Procedure(s) (LRB): LEFT TOTAL KNEE ARTHROPLASTY (Left)  Attending Physician:  Dr. Durene Romans   Admission Diagnoses:   Left knee primary OA / pain  Discharge Diagnoses:  Principal Problem:   S/P left TKA Active Problems:   S/P knee replacement  Past Medical History:  Diagnosis Date  . Arthritis   . Ascending aortic aneurysm (HCC)    a. 10/2015 CTA chest: 4.4cm aneurysmal dil of Asc Ao-->rec f/u in 1 yr.  . History of kidney stones   . Hyperlipidemia   . Hypertension   . Pneumonia   . SBO (small bowel obstruction) (HCC)    a. 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  [name] Sleep apnea     HPI:    Keith Vance, 55 y.o. male, has a history of pain and functional disability in the left knee due to arthritis and has failed non-surgical conservative treatments for greater than 12 weeks to include NSAID's and/or analgesics, corticosteriod injections, use of assistive devices and activity modification.  Onset of symptoms was gradual, starting 3-4 years ago with gradually worsening course since that time. The patient noted no past surgery on the left knee(s).  Patient currently rates pain in the left knee(s) at 10 out of 10 with activity. Patient has night pain, worsening of pain with activity and weight bearing, pain that interferes with activities of daily living, pain with passive range of motion, crepitus and joint swelling.  Patient has evidence of periarticular osteophytes and joint space narrowing by imaging studies.  There is no active infection.   Risks, benefits and expectations were discussed with the patient.  Risks including but not limited to the risk of anesthesia, blood clots, nerve damage, blood vessel damage, failure of the prosthesis, infection and up to and including death.  Patient understand the risks, benefits and  expectations and wishes to proceed with surgery.  PCP: Jearld Lesch, MD   Discharged Condition: good  Hospital Course:  Patient underwent the above stated procedure on 04/24/2016. Patient tolerated the procedure well and brought to the recovery room in good condition and subsequently to the floor.  POD #1 BP: 130/89 ; Pulse: 65 ; Temp: 98 F (36.7 C) ; Resp: 20 Patient reports pain as mild to moderate.  No events.  Wanted [employer]!  Ready to be discharged home. Neurovascular intact and incision: dressing C/D/I  LABS  Basename    HGB     14.3  HCT     40.6    Discharge Exam: General appearance: alert, cooperative and no distress Extremities: Homans sign is negative, no sign of DVT, no edema, redness or tenderness in the calves or thighs and no ulcers, gangrene or trophic changes  Disposition: Home with follow up in 2 weeks   Follow-up Information    Shelda Pal, MD. Schedule an appointment as soon as possible for a visit in 2 week(s).   Specialty:  Orthopedic Surgery Contact information: [address] [place] [address] [phone]        [facility] .   Why:  HH physical therapy Contact information: [address] [place] [address] [phone]        [facility] .   Why:  rolling walker Contact information: [address] [place] [address] [phone]  Discharge Instructions    Call MD / Call 911    Complete by:  As directed   If you experience chest pain or shortness of breath, CALL 911 and be transported to the hospital emergency room.  If you develope a fever above 101 F, pus (white drainage) or increased drainage or redness at the wound, or calf pain, call your surgeon's office.   Change dressing    Complete by:  As directed   Maintain surgical dressing until follow up in the clinic. If the edges start to pull up, may reinforce with tape. If the dressing is no  longer working, may remove and cover with gauze and tape, but must keep the area dry and clean.  Call with any questions or concerns.   Constipation Prevention    Complete by:  As directed   Drink plenty of fluids.  Prune juice may be helpful.  You may use a stool softener, such as Colace (over the counter) 100 mg twice a day.  Use MiraLax (over the counter) for constipation as needed.   Diet - low sodium heart healthy    Complete by:  As directed   Discharge instructions    Complete by:  As directed   Maintain surgical dressing until follow up in the clinic. If the edges start to pull up, may reinforce with tape. If the dressing is no longer working, may remove and cover with gauze and tape, but must keep the area dry and clean.  [facility]. Call with any questions or concerns.   Increase activity slowly as tolerated    Complete by:  As directed   Weight bearing as tolerated with assist device (walker, cane, etc) as directed, use it as long as suggested by your surgeon or therapist, typically at least 4-6 weeks.   TED hose    Complete by:  As directed   Use stockings (TED hose) for 2 weeks on both leg(s).  You may remove them at night for sleeping.        Medication List    STOP taking these medications   aspirin 81 MG EC tablet Replaced by:  aspirin 81 MG chewable tablet   meloxicam 15 MG tablet Commonly known as:  MOBIC   traMADol 50 MG tablet Commonly known as:  ULTRAM     TAKE these medications   amLODipine 10 MG tablet Commonly known as:  NORVASC Take 10 mg by mouth daily.   aspirin 81 MG chewable tablet Chew 1 tablet (81 mg total) by mouth 2 (two) times daily. Take for 4 weeks, then resume regular dose. Replaces:  aspirin 81 MG EC tablet   celecoxib 200 MG capsule Commonly known as:  CELEBREX Take 1 capsule (200 mg total) by mouth every 12 (twelve) hours.   docusate sodium 100 MG capsule Commonly known as:  COLACE Take 1 capsule  (100 mg total) by mouth 2 (two) times daily.   ferrous sulfate 325 (65 FE) MG tablet Take 1 tablet (325 mg total) by mouth 3 (three) times daily after meals.   HYDROcodone-acetaminophen 7.5-325 MG tablet Commonly known as:  NORCO Take 1-2 tablets by mouth every 4 (four) hours as needed for moderate pain.   lisinopril-hydrochlorothiazide 20-12.5 MG tablet Commonly known as:  PRINZIDE,ZESTORETIC Take 1 tablet by mouth daily.   METAMUCIL FIBER PO Take 1 tablet by mouth daily.   methocarbamol 500 MG tablet Commonly known as:  ROBAXIN Take 1 tablet (500 mg  total) by mouth every 6 (six) hours as needed for muscle spasms.   multivitamin with minerals tablet Take 1 tablet by mouth daily.   omega-3 acid ethyl esters 1 g capsule Commonly known as:  LOVAZA Take 2 g by mouth daily.   polyethylene glycol packet Commonly known as:  MIRALAX / GLYCOLAX Take 17 g by mouth 2 (two) times daily.        Signed: Anastasio Auerbach. Bradly Sangiovanni   PA-C  05/07/2016, 3:36 PM

## 2016-05-09 DIAGNOSIS — Z96652 Presence of left artificial knee joint: Secondary | ICD-10-CM | POA: Diagnosis not present

## 2016-05-09 DIAGNOSIS — Z87891 Personal history of nicotine dependence: Secondary | ICD-10-CM | POA: Diagnosis not present

## 2016-05-09 DIAGNOSIS — I10 Essential (primary) hypertension: Secondary | ICD-10-CM | POA: Diagnosis not present

## 2016-05-09 DIAGNOSIS — E785 Hyperlipidemia, unspecified: Secondary | ICD-10-CM | POA: Diagnosis not present

## 2016-05-09 DIAGNOSIS — I712 Thoracic aortic aneurysm, without rupture: Secondary | ICD-10-CM | POA: Diagnosis not present

## 2016-05-09 DIAGNOSIS — Z471 Aftercare following joint replacement surgery: Secondary | ICD-10-CM | POA: Diagnosis not present

## 2016-05-09 DIAGNOSIS — Z7982 Long term (current) use of aspirin: Secondary | ICD-10-CM | POA: Diagnosis not present

## 2016-05-09 DIAGNOSIS — M1991 Primary osteoarthritis, unspecified site: Secondary | ICD-10-CM | POA: Diagnosis not present

## 2016-05-09 DIAGNOSIS — G4730 Sleep apnea, unspecified: Secondary | ICD-10-CM | POA: Diagnosis not present

## 2016-05-09 DIAGNOSIS — Z79891 Long term (current) use of opiate analgesic: Secondary | ICD-10-CM | POA: Diagnosis not present

## 2016-05-19 DIAGNOSIS — M1712 Unilateral primary osteoarthritis, left knee: Secondary | ICD-10-CM | POA: Diagnosis not present

## 2016-05-22 DIAGNOSIS — M1712 Unilateral primary osteoarthritis, left knee: Secondary | ICD-10-CM | POA: Diagnosis not present

## 2016-05-25 DIAGNOSIS — M1712 Unilateral primary osteoarthritis, left knee: Secondary | ICD-10-CM | POA: Diagnosis not present

## 2016-05-30 DIAGNOSIS — M1712 Unilateral primary osteoarthritis, left knee: Secondary | ICD-10-CM | POA: Diagnosis not present

## 2016-06-02 DIAGNOSIS — M1712 Unilateral primary osteoarthritis, left knee: Secondary | ICD-10-CM | POA: Diagnosis not present

## 2016-06-05 DIAGNOSIS — M1712 Unilateral primary osteoarthritis, left knee: Secondary | ICD-10-CM | POA: Diagnosis not present

## 2016-06-07 DIAGNOSIS — Z96652 Presence of left artificial knee joint: Secondary | ICD-10-CM | POA: Diagnosis not present

## 2016-06-07 DIAGNOSIS — M1712 Unilateral primary osteoarthritis, left knee: Secondary | ICD-10-CM | POA: Diagnosis not present

## 2016-06-09 DIAGNOSIS — Z23 Encounter for immunization: Secondary | ICD-10-CM | POA: Diagnosis not present

## 2016-09-27 ENCOUNTER — Telehealth: Payer: Self-pay | Admitting: *Deleted

## 2016-09-27 MED ORDER — LISINOPRIL-HYDROCHLOROTHIAZIDE 20-12.5 MG PO TABS: 1.0000 | ORAL_TABLET | Freq: Every day | ORAL | 5 refills | Status: DC

## 2016-09-27 MED ORDER — AMLODIPINE BESYLATE 10 MG PO TABS: 10.0000 mg | ORAL_TABLET | Freq: Every day | ORAL | 5 refills | Status: DC

## 2016-09-27 NOTE — Telephone Encounter (Signed)
INFORMED PATIENT WILL REFILL MEDICATION FOR 5 MONTHS UNTIL NEXT FOLLOW UP APPOINTMENT  RECALL APPOINTMENT SCHEDULE PATIENT AWARE. 30 DAY SUPPLY WITH REFILLS E-SENT TO PHARMACY

## 2016-09-27 NOTE — Telephone Encounter (Signed)
pt needs refills on amliodipine and lisinopril-hctz and his PCP has shut down, will Dr Allyson SabalBerry take these over, if so please send in and inform pt, thanks

## 2016-10-03 DIAGNOSIS — Z0000 Encounter for general adult medical examination without abnormal findings: Secondary | ICD-10-CM | POA: Diagnosis not present

## 2016-10-04 DIAGNOSIS — R740 Nonspecific elevation of levels of transaminase and lactic acid dehydrogenase [LDH]: Secondary | ICD-10-CM | POA: Diagnosis not present

## 2016-10-04 DIAGNOSIS — I10 Essential (primary) hypertension: Secondary | ICD-10-CM | POA: Diagnosis not present

## 2016-10-04 DIAGNOSIS — E785 Hyperlipidemia, unspecified: Secondary | ICD-10-CM | POA: Diagnosis not present

## 2016-11-16 DIAGNOSIS — R509 Fever, unspecified: Secondary | ICD-10-CM | POA: Diagnosis not present

## 2016-11-16 DIAGNOSIS — Z20828 Contact with and (suspected) exposure to other viral communicable diseases: Secondary | ICD-10-CM | POA: Diagnosis not present

## 2016-11-16 DIAGNOSIS — R05 Cough: Secondary | ICD-10-CM | POA: Diagnosis not present

## 2017-01-08 DIAGNOSIS — S2096XA Insect bite (nonvenomous) of unspecified parts of thorax, initial encounter: Secondary | ICD-10-CM | POA: Diagnosis not present

## 2017-02-13 ENCOUNTER — Encounter: Payer: Self-pay | Admitting: Cardiovascular Disease

## 2017-02-13 ENCOUNTER — Ambulatory Visit (INDEPENDENT_AMBULATORY_CARE_PROVIDER_SITE_OTHER): Payer: BLUE CROSS/BLUE SHIELD | Admitting: Cardiovascular Disease

## 2017-02-13 VITALS — BP 152/98 | HR 89 | Ht 70.5 in | Wt 287.0 lb

## 2017-02-13 DIAGNOSIS — E785 Hyperlipidemia, unspecified: Secondary | ICD-10-CM | POA: Diagnosis not present

## 2017-02-13 DIAGNOSIS — I712 Thoracic aortic aneurysm, without rupture, unspecified: Secondary | ICD-10-CM

## 2017-02-13 DIAGNOSIS — I10 Essential (primary) hypertension: Secondary | ICD-10-CM

## 2017-02-13 MED ORDER — LISINOPRIL-HYDROCHLOROTHIAZIDE 20-12.5 MG PO TABS: 1.0000 | ORAL_TABLET | Freq: Two times a day (BID) | ORAL | 6 refills | Status: DC

## 2017-02-13 NOTE — Assessment & Plan Note (Signed)
History of essential hypertension blood pressure 152/98. He is on amlodipine 10, lisinopril/hydrochlorothiazide 20/12.5. He says his blood pressure usually is this high. I'm going to double his lisinopril/hydrochlorothiazide and check a basic metabolic panel in 3 weeks

## 2017-02-13 NOTE — Progress Notes (Signed)
02/13/2017 Keith Vance   16-Jul-1961  [id number]  Primary Physician Practice, [facility] Cardiologist: Lorretta Harp MD Renae Gloss  HPI:  Keith Vance is a 56 year old severely overweight married Caucasian male father of one child who I last saw in the office 01/07/16. I met him during a hospitalization for atypical chest pain and small bowel obstruction. He had a remote cardiac catheterization at [facility] revealing normal coronary arteries. This was Dr. Vernona Rieger hypertension. He's never had a heart attack or stroke. There is no family history A 2-D echo was entirely normal during his recent consultation as was a Myoview stress test. His small bowel obstruction resolved with conservative therapy. He denies chest pain. He had a uncomplicated left total knee replacement by Dr. Alvan Dame 04/24/16.    Current Outpatient Prescriptions  Medication Sig Dispense Refill  . amLODipine (NORVASC) 10 MG tablet Take 1 tablet (10 mg total) by mouth daily. 30 tablet 5  . lisinopril-hydrochlorothiazide (PRINZIDE,ZESTORETIC) 20-12.5 MG tablet Take 1 tablet by mouth daily. 30 tablet 5  . Multiple Vitamins-Minerals (MULTIVITAMIN WITH MINERALS) tablet Take 1 tablet by mouth daily.    [name] omega-3 acid ethyl esters (LOVAZA) 1 g capsule Take 2 g by mouth daily.    . polyethylene glycol (MIRALAX / GLYCOLAX) packet Take 17 g by mouth 2 (two) times daily. 14 each 0  . Psyllium (METAMUCIL FIBER PO) Take 1 tablet by mouth daily.     No current facility-administered medications for this visit.     No Known Allergies  Social History   Social History  . Marital status: Single    Spouse name: N/A  . Number of children: N/A  . Years of education: N/A   Occupational History  . Not on file.   Social History Main Topics  . Smoking status: Former Smoker    Quit date: 11/11/1979  . Smokeless tobacco: Never Used  . Alcohol use No  . Drug use: No  . Sexual  activity: Yes   Other Topics Concern  . Not on file   Social History Narrative  . No narrative on file     Review of Systems: General: negative for chills, fever, night sweats or weight changes.  Cardiovascular: negative for chest pain, dyspnea on exertion, edema, orthopnea, palpitations, paroxysmal nocturnal dyspnea or shortness of breath Dermatological: negative for rash Respiratory: negative for cough or wheezing Urologic: negative for hematuria Abdominal: negative for nausea, vomiting, diarrhea, bright red blood per rectum, melena, or hematemesis Neurologic: negative for visual changes, syncope, or dizziness All other systems reviewed and are otherwise negative except as noted above.    Blood pressure (!) 152/98, pulse 89, height 5' 10.5" (1.791 m), weight 287 lb (130.2 kg).  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 Personally reviewed this EKG.  ASSESSMENT AND PLAN:   Hypertension History of essential hypertension blood pressure 152/98. He is on amlodipine 10, lisinopril/hydrochlorothiazide 20/12.5. He says his blood pressure usually is this high. I'm going to double his lisinopril/hydrochlorothiazide and check a basic metabolic panel in 3 weeks  Hyperlipidemia History of hyperlipidemia on Lovaza followed by his PCP  Thoracic  aortic aneurysm [facility]) History of thoracic aortic aneurysm measuring 4.4 cm chest CTA performed 11/11/15. We will repeat his chest CTA to follow his aneurysm size.      Lorretta Harp MD FACP,FACC,FAHA, [facility] 02/13/2017 3:32 PM

## 2017-02-13 NOTE — Patient Instructions (Signed)
Medication Instructions: Increase Lisinopril-HCTZ to twice daily.   Labwork: Your physician recommends that you return for lab work: BMET--prior to CTA; repeat BMET in 3 weeks.   Testing/Procedures: Chest/Aorta CT Angiography (CTA), is a special type of CT scan that uses a computer to produce multi-dimensional views of major blood vessels throughout the body. In CT angiography, a contrast material is injected through an IV to help visualize the blood vessels.   Follow-Up: Your physician wants you to follow-up in: 1 year with Dr. Allyson SabalBerry. You will receive a reminder letter in the mail two months in advance. If you don't receive a letter, please call our office to schedule the follow-up appointment.  If you need a refill on your cardiac medications before your next appointment, please call your pharmacy.

## 2017-02-13 NOTE — Assessment & Plan Note (Signed)
History of thoracic aortic aneurysm measuring 4.4 cm chest CTA performed 11/11/15. We will repeat his chest CTA to follow his aneurysm size.

## 2017-02-13 NOTE — Assessment & Plan Note (Signed)
History of hyperlipidemia on Lovaza followed by his PCP 

## 2017-02-14 LAB — BASIC METABOLIC PANEL
BUN/Creatinine Ratio: 18 (ref 9–20)
BUN: 18 mg/dL (ref 6–24)
CO2: 24 mmol/L (ref 20–29)
Calcium: 9.6 mg/dL (ref 8.7–10.2)
Chloride: 102 mmol/L (ref 96–106)
Creatinine, Ser: 0.98 mg/dL (ref 0.76–1.27)
GFR calc Af Amer: 100 mL/min/{1.73_m2} (ref >59)
GFR calc non Af Amer: 86 mL/min/{1.73_m2} (ref >59)
Glucose: 86 mg/dL (ref 65–99)
Potassium: 3.9 mmol/L (ref 3.5–5.2)
Sodium: 140 mmol/L (ref 134–144)

## 2017-02-20 ENCOUNTER — Ambulatory Visit (INDEPENDENT_AMBULATORY_CARE_PROVIDER_SITE_OTHER)
Admission: RE | Admit: 2017-02-20 | Discharge: 2017-02-20 | Disposition: A | Discharge status: Home / Self Care | Payer: BLUE CROSS/BLUE SHIELD | Source: Ambulatory Visit | Attending: Cardiovascular Disease | Admitting: Cardiovascular Disease

## 2017-02-20 DIAGNOSIS — E785 Hyperlipidemia, unspecified: Secondary | ICD-10-CM

## 2017-02-20 DIAGNOSIS — I712 Thoracic aortic aneurysm, without rupture, unspecified: Secondary | ICD-10-CM

## 2017-02-20 DIAGNOSIS — I10 Essential (primary) hypertension: Secondary | ICD-10-CM

## 2017-02-20 MED ORDER — IOPAMIDOL (ISOVUE-370) INJECTION 76%
100.0000 mL | Freq: Once | INTRAVENOUS | Status: AC | PRN
  Administered 2017-02-20: 100 mL via INTRAVENOUS

## 2017-04-23 ENCOUNTER — Other Ambulatory Visit: Payer: Self-pay | Admitting: Cardiovascular Disease

## 2017-04-23 MED ORDER — AMLODIPINE BESYLATE 10 MG PO TABS: 10.0000 mg | ORAL_TABLET | Freq: Every day | ORAL | 10 refills | Status: DC

## 2017-04-23 NOTE — Telephone Encounter (Signed)
Rx(s) sent to pharmacy electronically.  

## 2017-05-02 ENCOUNTER — Other Ambulatory Visit (HOSPITAL_COMMUNITY): Payer: Self-pay | Admitting: Orthopedic Surgery

## 2017-05-02 DIAGNOSIS — M25562 Pain in left knee: Secondary | ICD-10-CM

## 2017-05-08 ENCOUNTER — Encounter (HOSPITAL_COMMUNITY)
Admission: RE | Admit: 2017-05-08 | Discharge: 2017-05-08 | Disposition: A | Discharge status: Home / Self Care | Payer: Worker's Compensation | Source: Ambulatory Visit | Attending: Orthopedic Surgery | Admitting: Orthopedic Surgery

## 2017-05-08 DIAGNOSIS — M25562 Pain in left knee: Secondary | ICD-10-CM

## 2017-05-08 DIAGNOSIS — M7989 Other specified soft tissue disorders: Secondary | ICD-10-CM | POA: Diagnosis not present

## 2017-05-08 MED ORDER — TECHNETIUM TC 99M MEDRONATE IV KIT
25.0000 | PACK | Freq: Once | INTRAVENOUS | Status: AC | PRN
  Administered 2017-05-08: 25 via INTRAVENOUS

## 2017-06-29 ENCOUNTER — Telehealth: Payer: Self-pay | Admitting: *Deleted

## 2017-06-29 NOTE — Telephone Encounter (Signed)
   Chart reviewed as part of pre-operative protocol coverage. Given past medical history and time since last visit, my phone call to the patient today, and based on ACC/AHA guidelines, Kandis CockingRoy A Beadle would be at acceptable risk for the planned procedure without further cardiovascular testing.   Corine ShelterLuke Treysen Sudbeck, PA-C 06/29/2017, 4:16 PM

## 2017-06-29 NOTE — Telephone Encounter (Signed)
   [facility] Pre-operative Risk Assessment    Request for surgical clearance:  1. What type of surgery is being performed? REV. LEFT TKA   2. When is this surgery scheduled? 08/27/17   3. Are there any medications that need to be held prior to surgery and how long?NONE   4. Practice name and name of physician performing surgery? DR Frederik Pear   5. What is your office phone and fax number? [phone] [phone]   6. Anesthesia type (None, local, MAC, general) ? UNKNOWN   Fredia Beets 06/29/2017, 12:37 PM  _________________________________________________________________   (provider comments below)

## 2017-07-10 ENCOUNTER — Other Ambulatory Visit: Payer: Self-pay | Admitting: Orthopedic Surgery

## 2017-07-25 ENCOUNTER — Other Ambulatory Visit: Payer: Self-pay

## 2017-07-25 ENCOUNTER — Encounter (HOSPITAL_COMMUNITY): Payer: Self-pay

## 2017-07-25 ENCOUNTER — Encounter (HOSPITAL_COMMUNITY)
Admission: RE | Admit: 2017-07-25 | Discharge: 2017-07-25 | Disposition: A | Discharge status: Home / Self Care | Payer: Worker's Compensation | Source: Ambulatory Visit | Attending: Orthopedic Surgery | Admitting: Orthopedic Surgery

## 2017-07-25 DIAGNOSIS — Z01818 Encounter for other preprocedural examination: Secondary | ICD-10-CM

## 2017-07-25 DIAGNOSIS — X58XXXA Exposure to other specified factors, initial encounter: Secondary | ICD-10-CM | POA: Insufficient documentation

## 2017-07-25 DIAGNOSIS — T84033A Mechanical loosening of internal left knee prosthetic joint, initial encounter: Secondary | ICD-10-CM | POA: Insufficient documentation

## 2017-07-25 DIAGNOSIS — Z01812 Encounter for preprocedural laboratory examination: Secondary | ICD-10-CM | POA: Diagnosis present

## 2017-07-25 HISTORY — DX: Gastro-esophageal reflux disease without esophagitis: K21.9

## 2017-07-25 LAB — CBC WITH DIFFERENTIAL/PLATELET
Basophils Absolute: 0.1 10*3/uL (ref 0.0–0.1)
Basophils Relative: 1 %
Eosinophils Absolute: 0.2 10*3/uL (ref 0.0–0.7)
Eosinophils Relative: 3 %
HCT: 48.6 % (ref 39.0–52.0)
Hemoglobin: 16.8 g/dL (ref 13.0–17.0)
Lymphocytes Relative: 30 %
Lymphs Abs: 2.6 10*3/uL (ref 0.7–4.0)
MCH: 32.9 pg (ref 26.0–34.0)
MCHC: 34.6 g/dL (ref 30.0–36.0)
MCV: 95.3 fL (ref 78.0–100.0)
Monocytes Absolute: 0.5 10*3/uL (ref 0.1–1.0)
Monocytes Relative: 6 %
Neutro Abs: 5.3 10*3/uL (ref 1.7–7.7)
Neutrophils Relative %: 60 %
Platelets: 183 10*3/uL (ref 150–400)
RBC: 5.1 MIL/uL (ref 4.22–5.81)
RDW: 13.5 % (ref 11.5–15.5)
WBC: 8.6 10*3/uL (ref 4.0–10.5)

## 2017-07-25 LAB — URINALYSIS, ROUTINE W REFLEX MICROSCOPIC
Bilirubin Urine: NEGATIVE
Glucose, UA: NEGATIVE mg/dL
Hgb urine dipstick: NEGATIVE
Ketones, ur: NEGATIVE mg/dL
Leukocytes, UA: NEGATIVE
Nitrite: NEGATIVE
Protein, ur: NEGATIVE mg/dL
Specific Gravity, Urine: 1.019 (ref 1.005–1.030)
pH: 7 (ref 5.0–8.0)

## 2017-07-25 LAB — BASIC METABOLIC PANEL
Anion gap: 9 (ref 5–15)
BUN: 15 mg/dL (ref 6–20)
CO2: 25 mmol/L (ref 22–32)
Calcium: 9.4 mg/dL (ref 8.9–10.3)
Chloride: 101 mmol/L (ref 101–111)
Creatinine, Ser: 0.81 mg/dL (ref 0.61–1.24)
GFR calc Af Amer: >60 mL/min (ref >60)
GFR calc non Af Amer: >60 mL/min (ref >60)
Glucose, Bld: 110 mg/dL — ABNORMAL HIGH (ref 65–99)
Potassium: 3.7 mmol/L (ref 3.5–5.1)
Sodium: 135 mmol/L (ref 135–145)

## 2017-07-25 LAB — TYPE AND SCREEN
ABO/RH(D): O POS
Antibody Screen: NEGATIVE

## 2017-07-25 LAB — PROTIME-INR
INR: 1.11
Prothrombin Time: 14.2 seconds (ref 11.4–15.2)

## 2017-07-25 LAB — SURGICAL PCR SCREEN
MRSA, PCR: NEGATIVE
Staphylococcus aureus: NEGATIVE

## 2017-07-25 LAB — ABO/RH: ABO/RH(D): O POS

## 2017-07-25 LAB — APTT: aPTT: 41 seconds — ABNORMAL HIGH (ref 24–36)

## 2017-07-25 NOTE — Pre-Procedure Instructions (Signed)
Keith Vance  07/25/2017      [facility] - [place], [place] - [address] [place] [address] Phone: [phone] Fax: [phone]  [facility] [id number] [name]- [place], [place] - [id number] [facility] [address] [place] [address] Phone: [phone] Fax: [phone]  [facility] [place], [place] - [address] [place] [id number] Phone: [phone] Fax: [phone]    Your procedure is scheduled on 07/30/17.  [facility] at 530 [facility].M.  Call this number if you have problems the morning of surgery:  [phone]   Remember:  Do not eat food or drink liquids after midnight.  Take these medicines the morning of surgery with A SIP OF WATER        Amlodipine(norvasc),tramadol if needed  STOP all herbel meds, nsaids (aleve,naproxen,advil,ibuprofen) Starting today 07/25/17 including all vitamins/supplements,aspirin,fish oil, meloxicam   Do not wear jewelry, make-up or nail polish.  Do not wear lotions, powders, or perfumes, or deoderant.  Do not shave 48 hours prior to surgery.  Men may shave face and neck.  Do not bring valuables to the hospital.  [facility] is not responsible for any belongings or valuables.  Contacts, dentures or bridgework may not be worn into surgery.  Leave your suitcase in the car.  After surgery it may be brought to your room.  For patients admitted to the hospital, discharge time will be determined by your treatment team.  Patients discharged the day of surgery will not be allowed to drive home.   Special instructions:   Special Instructions: Keith Vance - Preparing for Surgery  Before surgery, you can play an important role.  Because skin is not sterile, your skin needs to be as free of germs as possible.  You can reduce the number of germs on you skin by washing with CHG (chlorahexidine gluconate) soap before  surgery.  CHG is an antiseptic cleaner which kills germs and bonds with the skin to continue killing germs even after washing.  Please DO NOT use if you have an allergy to CHG or antibacterial soaps.  If your skin becomes reddened/irritated stop using the CHG and inform your nurse when you arrive at Short Stay.  Do not shave (including legs and underarms) for at least 48 hours prior to the first CHG shower.  You may shave your face.  Please follow these instructions carefully:   1.  Shower with CHG Soap the night before surgery and the morning of Surgery.  2.  If you choose to wash your hair, wash your hair first as usual with your normal shampoo.  3.  After you shampoo, rinse your hair and body thoroughly to remove the Shampoo.  4.  Use CHG as you would any other liquid soap.  You can apply chg directly  to the skin and wash gently with scrungie or a clean washcloth.  5.  Apply the CHG Soap to your body ONLY FROM THE NECK DOWN.  Do not use on open wounds or open sores.  Avoid contact with your eyes ears, mouth and genitals (private parts).  Wash genitals (private parts)       with your normal soap.  6.  Wash thoroughly, paying special attention to the area where your surgery will be performed.  7.  Thoroughly rinse your body with warm water from the  neck down.  8.  DO NOT shower/wash with your normal soap after using and rinsing off the CHG Soap.  9.  Pat yourself dry with a clean towel.            10.  Wear clean pajamas.            11.  Place clean sheets on your bed the night of your first shower and do not sleep with pets.  Day of Surgery  Do not apply any lotions/deodorants the morning of surgery.  Please wear clean clothes to the hospital/surgery center.  Please read over the fact sheets that you were given.        

## 2017-07-25 NOTE — Pre-Procedure Instructions (Signed)
Keith Vance  07/25/2017      [facility] - [place], [place] - [address] [place] [address] Phone: [phone] Fax: [phone]  [facility] [id number] [name]- [place], [place] - [id number] [facility] [address] [place] [address] Phone: [phone] Fax: [phone]  [facility] - [place], [place] - [address] [place] [id number] Phone: [phone] Fax: [phone]    Your procedure is scheduled on 07/30/17.  [facility] at [facility].M.  Call this number if you have problems the morning of surgery:  [phone]   Remember:  Do not eat food or drink liquids after midnight.  Take these medicines the morning of surgery with A SIP OF WATER        Amlodipine(norvasc),tramadol if needed  STOP all herbel meds, nsaids (aleve,naproxen,advil,ibuprofen) Starting today 07/25/17 including all vitamins/supplements,aspirin,fish oil, meloxicam   Do not wear jewelry, make-up or nail polish.  Do not wear lotions, powders, or perfumes, or deoderant.  Do not shave 48 hours prior to surgery.  Men may shave face and neck.  Do not bring valuables to the hospital.  [facility] is not responsible for any belongings or valuables.  Contacts, dentures or bridgework may not be worn into surgery.  Leave your suitcase in the car.  After surgery it may be brought to your room.  For patients admitted to the hospital, discharge time will be determined by your treatment team.  Patients discharged the day of surgery will not be allowed to drive home.   Special instructions:   Special Instructions: Keith Vance - Preparing for Surgery  Before surgery, you can play an important role.  Because skin is not sterile, your skin needs to be as free of germs as possible.  You can reduce the number of germs on you skin by washing with CHG (chlorahexidine gluconate) soap before  surgery.  CHG is an antiseptic cleaner which kills germs and bonds with the skin to continue killing germs even after washing.  Please DO NOT use if you have an allergy to CHG or antibacterial soaps.  If your skin becomes reddened/irritated stop using the CHG and inform your nurse when you arrive at Short Stay.  Do not shave (including legs and underarms) for at least 48 hours prior to the first CHG shower.  You may shave your face.  Please follow these instructions carefully:   1.  Shower with CHG Soap the night before surgery and the morning of Surgery.  2.  If you choose to wash your hair, wash your hair first as usual with your normal shampoo.  3.  After you shampoo, rinse your hair and body thoroughly to remove the Shampoo.  4.  Use CHG as you would any other liquid soap.  You can apply chg directly  to the skin and wash gently with scrungie or a clean washcloth.  5.  Apply the CHG Soap to your body ONLY FROM THE NECK DOWN.  Do not use on open wounds or open sores.  Avoid contact with your eyes ears, mouth and genitals (private parts).  Wash genitals (private parts)       with your normal soap.  6.  Wash thoroughly, paying special attention to the area where your surgery will be performed.  7.  Thoroughly rinse your body with warm water from the  neck down.  8.  DO NOT shower/wash with your normal soap after using and rinsing off the CHG Soap.  9.  Pat yourself dry with a clean towel.            10.  Wear clean pajamas.            11.  Place clean sheets on your bed the night of your first shower and do not sleep with pets.  Day of Surgery  Do not apply any lotions/deodorants the morning of surgery.  Please wear clean clothes to the hospital/surgery center.  Please read over the fact sheets that you were given.

## 2017-07-26 NOTE — Progress Notes (Signed)
Anesthesia Chart Review: Patient is a 56 year old male scheduled for left TK revision on 07/30/17 by Dr. Gean BirchwoodFrank Rowan.  History includes former smoker (quit '81), HTN, HLD, ascending TAA (4.4 cm 02/20/17 CTA), SBO (02/2015, 10/2015), nephrolithiasis, OSA (CPAP), arthritis, left TKA 04/24/16 (spinal anesthesia), umbilical hernia repair. BMI is consistent with morbid obesity.  [facility]. Cardiologist is Dr. Nanetta BattyJonathan Berry, last visit 02/13/17. Per Corine ShelterLuke Kilroy, PA-C note on 06/29/17, "...based on ACC/AHA guidelines, Keith CockingRoy A Vance would be at acceptable risk for the planned procedure without further cardiovascular testing."   Meds include amlodipine, aspirin 81 mg, lisinopril-HCTZ, fish oil, tramadol.  BP 137/86   Pulse 84   Temp 36.6 C   Resp 20   Ht 5\' 10"  (1.778 m)   Wt 285 lb 4.8 oz (129.4 kg)   SpO2 99%   BMI 40.94 kg/m    EKG 02/13/17: NSR, LAFB.  Nuclear stress test 11/14/15: IMPRESSION: 1. No evidence for pharmacologically induced ischemia. Decreased uptake along the inferior wall may represent diaphragmatic attenuation. 2. Normal left ventricular wall motion. 3. Left ventricular ejection fraction is 53%. 4. Low-risk stress test findings*.  Echo 11/13/15: Study Conclusions - Left ventricle: The cavity size was normal. Wall thickness was   increased in a pattern of mild LVH. Systolic function was normal.   The estimated ejection fraction was in the range of 60% to 65%.   Wall motion was normal; there were no regional wall motion   abnormalities. Doppler parameters are consistent with abnormal   left ventricular relaxation (grade 1 diastolic dysfunction). - Aortic valve: There was no stenosis. - Aorta: Mildly dilated ascending aorta and aortic root. Aortic   root dimension: 39 mm (ED). Ascending aortic diameter: 40 mm (S). - Mitral valve: There was trivial regurgitation. - Right ventricle: The cavity size was normal. Systolic function   was  normal. - Tricuspid valve: Peak RV-RA gradient (S): 39 mm Hg. - Pulmonary arteries: PA peak pressure: 42 mm Hg (S). Impressions: - Normal LV size with mild LV hypertrophy. EF 60-65%. Normal RV   size and systolic function. No significant valvular   abnormalities. Mild pulmonary hypertension.  According to Dr. Hazle CocaBerry's note, patient has a [facility] coron[facility].  CTA chest 02/20/17: IMPRESSION: 1. Stable ectasia of the ascending thoracic aorta, maximum diameter 4.4 cm. The remainder of the thoracic and upper abdominal aorta are of normal caliber. 2. 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 3.  No acute cardiopulmonary disease. 4. Mild LAD and right coronary artery atherosclerosis. Mild thoracic aortic atherosclerosis. 5. Diffuse hepatic steatosis. 6. Stable mild splenomegaly. Recommend annual imaging followup by CTA or MRA. This recommendation follows 2010 ACCF/AHA/AATS/ACR/ASA/SCA/SCAI/SIR/STS/SVM Guidelines for the Diagnosis and Management of Patients with Thoracic Aortic Disease. Circulation. 2010; 121: [id number]  Preoperative labs noted. Glucose 110. Cr 0.81. CBC, PT/INR WNL. PTT 41 [facility] Wadie Lessenowan's office). UA WNL. There are no comparison PTTs. He is not on heparin. Will recheck STAT PTT on the day of surgery since case is posted for spinal anesthesia.    If no acute changes then I would anticipate that he can proceed as planned.  Velna Ochsllison Zelenak, PA-C [facility]Anesthesiology Phone [phone] 07/26/2017 12:33 PM

## 2017-07-27 DIAGNOSIS — T84018A Broken internal joint prosthesis, other site, initial encounter: Secondary | ICD-10-CM

## 2017-07-27 DIAGNOSIS — Z96659 Presence of unspecified artificial knee joint: Secondary | ICD-10-CM

## 2017-07-27 MED ORDER — DEXTROSE 5 % IV SOLN
3.0000 g | INTRAVENOUS | Status: AC
  Administered 2017-07-30: 3 g via INTRAVENOUS
  Filled 2017-07-27: qty 3

## 2017-07-27 MED ORDER — LACTATED RINGERS IV SOLN
INTRAVENOUS | Status: DC
  Administered 2017-07-30 (×2): via INTRAVENOUS

## 2017-07-27 MED ORDER — TRANEXAMIC ACID 1000 MG/10ML IV SOLN
1000.0000 mg | INTRAVENOUS | Status: AC
  Administered 2017-07-30: 1000 mg via INTRAVENOUS
  Filled 2017-07-27: qty 1100

## 2017-07-27 MED ORDER — BUPIVACAINE LIPOSOME 1.3 % IJ SUSP
20.0000 mL | Freq: Once | INTRAMUSCULAR | Status: AC
  Administered 2017-07-30: 20 mL
  Filled 2017-07-27: qty 20

## 2017-07-27 MED ORDER — TRANEXAMIC ACID 1000 MG/10ML IV SOLN
2000.0000 mg | INTRAVENOUS | Status: AC
  Administered 2017-07-30: 2000 mg via TOPICAL
  Filled 2017-07-27: qty 20

## 2017-07-27 NOTE — H&P (Signed)
TOTAL KNEE REVISION ADMISSION H&P  Patient is being admitted for left revision total knee arthroplasty.  Subjective:  Chief Complaint:left knee pain.  HPI: Keith Vance, 56 y.o. male, has a history of pain and functional disability in the left knee(s) due to trauma and patient has failed non-surgical conservative treatments for greater than 12 weeks to include NSAID's and/or analgesics, use of assistive devices and activity modification. The indications for the revision of the total knee arthroplasty are loosening of one or more components. Onset of symptoms was abrupt starting 1 years ago with rapidlly worsening course since that time.  Prior procedures on the left knee(s) include arthroplasty.  Patient currently rates pain in the left knee(s) at 10 out of 10 with activity. There is night pain, worsening of pain with activity and weight bearing, pain that interferes with activities of daily living, pain with passive range of motion and joint swelling.  Patient has evidence of prosthetic loosening by imaging studies. This condition presents safety issues increasing the risk of falls.   There is no current active infection.  Patient Active Problem List   Diagnosis Date Noted  . S/P left TKA 04/24/2016  . S/P knee replacement 04/24/2016  . Thoracic aortic aneurysm (HCC) 01/07/2016  . Midsternal chest pain 11/14/2015  . Essential hypertension 11/14/2015  . Lactic acidosis 11/14/2015  . Dehydration 11/14/2015  . Positive D dimer   . Small bowel obstruction (HCC)   . Abdominal distension   . Chest pain 11/11/2015  . SBO (small bowel obstruction) (HCC) 02/27/2015  . Enteritis 02/27/2015  . Abdominal pain, periumbilical 02/27/2015  . Nausea and vomiting 02/27/2015  . Hypertension 02/27/2015  . Hyperlipidemia 02/27/2015  . Leukocytosis 02/27/2015   Past Medical History:  Diagnosis Date  . Arthritis   . Ascending aortic aneurysm (HCC)    a. 10/2015 CTA chest: 4.4cm aneurysmal dil of Asc  Ao-->rec f/u in 1 yr.  [name]. GERD (gastroesophageal reflux disease)    hx  . History of kidney stones   . Hyperlipidemia   . Hypertension   . Pneumonia    hx  . SBO (small bowel obstruction) (HCC)    a. 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  [name]. Sleep apnea    cpap    Past Surgical History:  Procedure Laterality Date  . HERNIA REPAIR Left    inguinal, and umbilical  . TOTAL KNEE ARTHROPLASTY Left 04/24/2016   Procedure: LEFT TOTAL KNEE ARTHROPLASTY;  Surgeon: Durene RomansMatthew Olin, MD;  Location: [facility];  Service: Orthopedics;  Laterality: Left;    No current facility-administered medications for this encounter.    Current Outpatient Medications  Medication Sig Dispense Refill Last Dose  . acetaminophen (TYLENOL) 650 MG CR tablet Take 2,600 mg by mouth every 8 (eight) hours as needed for pain.     [name]. amLODipine (NORVASC) 10 MG tablet Take 1 tablet (10 mg total) by mouth daily. 30 tablet 10   . aspirin EC 81 MG tablet Take 81 mg by mouth daily.     [name]. lisinopril-hydrochlorothiazide (PRINZIDE,ZESTORETIC) 20-12.5 MG tablet Take 1 tablet by mouth 2 (two) times daily. 60 tablet 6   . meloxicam (MOBIC) 15 MG tablet Take 15 mg by mouth daily.  0   . Menthol, Topical Analgesic, (BIOFREEZE EX) Apply 1 application topically as needed (for pain).     . Multiple Vitamins-Minerals (MULTIVITAMIN WITH MINERALS) tablet Take 1 tablet by mouth daily.   Taking  . Omega-3 Fatty Acids (FISH OIL) 1200 MG CAPS Take 1,200 mg  by mouth daily.     . polyethylene glycol (MIRALAX / GLYCOLAX) packet Take 17 g by mouth 2 (two) times daily. (Patient taking differently: Take 17 g by mouth daily. ) 14 each 0 Taking  . traMADol (ULTRAM) 50 MG tablet Take 50 mg by mouth 2 (two) times daily.  0    No Known Allergies  Social History   Tobacco Use  . Smoking status: Former Smoker    Last attempt to quit: 11/11/1979    Years since quitting: 37.7  . Smokeless tobacco: Never Used  Substance Use Topics  . Alcohol use: No    Family History  Problem  Relation Age of Onset  . Heart attack Maternal Grandmother        21>60s of age  . Hypertension Father   . Diabetes Father       Review of Systems  Constitutional: Negative.   HENT: Negative.   Eyes: Negative.   Respiratory: Negative.   Cardiovascular:       HTN  Gastrointestinal: Positive for abdominal pain.  Genitourinary: Positive for hematuria.       Kidney stones  Musculoskeletal: Positive for joint pain.  Skin: Negative.   Neurological: Negative.   Endo/Heme/Allergies: Negative.   Psychiatric/Behavioral: Negative.      Objective:  Physical Exam  Constitutional: He is oriented to person, place, and time. He appears well-developed and well-nourished.  HENT:  Head: Normocephalic and atraumatic.  Eyes: Pupils are equal, round, and reactive to light.  Neck: Normal range of motion. Neck supple.  Cardiovascular: Intact distal pulses.  Respiratory: Effort normal.  Musculoskeletal: He exhibits tenderness.  The left total knee has a 2-3+ effusion. range of motion is from 5-100 collateral ligaments are stable he is neurovascularly intact.  He is tender along the proximal medial flare of the tibia less so the lateral side.  He is not tender along the distal femur or patella.  Neurological: He is alert and oriented to person, place, and time.  Skin: Skin is warm and dry.  Psychiatric: He has a normal mood and affect. His behavior is normal. Judgment and thought content normal.    Vital signs in last 24 hours:    Labs:  Estimated body mass index is 40.94 kg/m as calculated from the following:   Height as of 07/25/17: 5\' 10"  (1.778 m).   Weight as of 07/25/17: 129.4 kg (285 lb 4.8 oz).  Imaging Review  Plain x-rays are nondiagnostic.  Bone scan shows evidence of possible synovitis and loosening of the tibial component.    Assessment/Plan:  End stage arthritis, left knee(s) with failed previous arthroplasty.   The patient history, physical examination, clinical judgment  of the provider and imaging studies are consistent with end stage degenerative joint disease of the left knee(s), previous total knee arthroplasty. Revision total knee arthroplasty is deemed medically necessary. The treatment options including medical management, injection therapy, arthroscopy and revision arthroplasty were discussed at length. The risks and benefits of revision total knee arthroplasty were presented and reviewed. The risks due to aseptic loosening, infection, stiffness, patella tracking problems, thromboembolic complications and other imponderables were discussed. The patient acknowledged the explanation, agreed to proceed with the plan and consent was signed. Patient is being admitted for inpatient treatment for surgery, pain control, PT, OT, prophylactic antibiotics, VTE prophylaxis, progressive ambulation and ADL's and discharge planning.The patient is planning to be discharged home with home health services.

## 2017-07-30 ENCOUNTER — Encounter (HOSPITAL_COMMUNITY): Payer: Self-pay | Admitting: General Practice

## 2017-07-30 ENCOUNTER — Inpatient Hospital Stay (HOSPITAL_COMMUNITY)
Admission: RE | Admit: 2017-07-30 | Discharge: 2017-08-01 | DRG: 488 | Disposition: A | Discharge status: Home Health | Payer: Worker's Compensation | Source: Ambulatory Visit | Attending: Orthopedic Surgery | Admitting: Orthopedic Surgery

## 2017-07-30 ENCOUNTER — Other Ambulatory Visit: Payer: Self-pay

## 2017-07-30 ENCOUNTER — Inpatient Hospital Stay (HOSPITAL_COMMUNITY): Payer: Worker's Compensation | Admitting: Vascular Surgery

## 2017-07-30 ENCOUNTER — Encounter (HOSPITAL_COMMUNITY)
Admission: RE | Disposition: A | Discharge status: Home Health | Payer: Self-pay | Source: Ambulatory Visit | Attending: Orthopedic Surgery

## 2017-07-30 DIAGNOSIS — Y792 Prosthetic and other implants, materials and accessory orthopedic devices associated with adverse incidents: Secondary | ICD-10-CM | POA: Diagnosis present

## 2017-07-30 DIAGNOSIS — T8484XA Pain due to internal orthopedic prosthetic devices, implants and grafts, initial encounter: Principal | ICD-10-CM | POA: Diagnosis present

## 2017-07-30 DIAGNOSIS — E785 Hyperlipidemia, unspecified: Secondary | ICD-10-CM | POA: Diagnosis not present

## 2017-07-30 DIAGNOSIS — Z79899 Other long term (current) drug therapy: Secondary | ICD-10-CM

## 2017-07-30 DIAGNOSIS — T84018A Broken internal joint prosthesis, other site, initial encounter: Secondary | ICD-10-CM

## 2017-07-30 DIAGNOSIS — M65862 Other synovitis and tenosynovitis, left lower leg: Secondary | ICD-10-CM | POA: Diagnosis present

## 2017-07-30 DIAGNOSIS — Z6841 Body Mass Index (BMI) 40.0 and over, adult: Secondary | ICD-10-CM

## 2017-07-30 DIAGNOSIS — D62 Acute posthemorrhagic anemia: Secondary | ICD-10-CM | POA: Diagnosis not present

## 2017-07-30 DIAGNOSIS — Z87891 Personal history of nicotine dependence: Secondary | ICD-10-CM | POA: Diagnosis not present

## 2017-07-30 DIAGNOSIS — Z7982 Long term (current) use of aspirin: Secondary | ICD-10-CM

## 2017-07-30 DIAGNOSIS — I10 Essential (primary) hypertension: Secondary | ICD-10-CM | POA: Diagnosis not present

## 2017-07-30 DIAGNOSIS — K219 Gastro-esophageal reflux disease without esophagitis: Secondary | ICD-10-CM | POA: Diagnosis present

## 2017-07-30 DIAGNOSIS — M25562 Pain in left knee: Secondary | ICD-10-CM | POA: Diagnosis present

## 2017-07-30 DIAGNOSIS — M1712 Unilateral primary osteoarthritis, left knee: Secondary | ICD-10-CM | POA: Diagnosis present

## 2017-07-30 DIAGNOSIS — Z9981 Dependence on supplemental oxygen: Secondary | ICD-10-CM

## 2017-07-30 DIAGNOSIS — Z96659 Presence of unspecified artificial knee joint: Secondary | ICD-10-CM

## 2017-07-30 DIAGNOSIS — G4730 Sleep apnea, unspecified: Secondary | ICD-10-CM | POA: Diagnosis not present

## 2017-07-30 HISTORY — PX: TOTAL KNEE REVISION: SHX996

## 2017-07-30 HISTORY — PX: REVISION TOTAL KNEE ARTHROPLASTY: SUR1280

## 2017-07-30 LAB — APTT: aPTT: 40 seconds — ABNORMAL HIGH (ref 24–36)

## 2017-07-30 SURGERY — TOTAL KNEE REVISION
Anesthesia: General | Site: Knee | Laterality: Left

## 2017-07-30 MED ORDER — ONDANSETRON HCL 4 MG PO TABS: 4.0000 mg | ORAL_TABLET | Freq: Four times a day (QID) | ORAL | Status: DC | PRN

## 2017-07-30 MED ORDER — SUGAMMADEX SODIUM 200 MG/2ML IV SOLN
INTRAVENOUS | Status: AC
  Filled 2017-07-30: qty 2

## 2017-07-30 MED ORDER — OXYCODONE HCL 5 MG PO TABS
5.0000 mg | ORAL_TABLET | ORAL | Status: DC | PRN
  Administered 2017-07-30 – 2017-07-31 (×2): 5 mg via ORAL
  Filled 2017-07-30 (×2): qty 1

## 2017-07-30 MED ORDER — OXYCODONE HCL 5 MG PO TABS
10.0000 mg | ORAL_TABLET | ORAL | Status: DC | PRN
  Administered 2017-07-30 – 2017-08-01 (×9): 10 mg via ORAL
  Filled 2017-07-30 (×9): qty 2

## 2017-07-30 MED ORDER — ACETAMINOPHEN 650 MG RE SUPP: 650.0000 mg | RECTAL | Status: DC | PRN

## 2017-07-30 MED ORDER — HYDROMORPHONE HCL 1 MG/ML IJ SOLN
0.5000 mg | INTRAMUSCULAR | Status: DC | PRN
  Administered 2017-07-30: 0.5 mg via INTRAVENOUS
  Filled 2017-07-30: qty 1

## 2017-07-30 MED ORDER — METHOCARBAMOL 500 MG PO TABS
ORAL_TABLET | ORAL | Status: AC
  Administered 2017-07-30: 500 mg via ORAL
  Filled 2017-07-30: qty 1

## 2017-07-30 MED ORDER — ACETAMINOPHEN 325 MG PO TABS: 650.0000 mg | ORAL_TABLET | ORAL | Status: DC | PRN

## 2017-07-30 MED ORDER — KETAMINE HCL 10 MG/ML IJ SOLN
INTRAMUSCULAR | Status: DC | PRN
  Administered 2017-07-30: 40 mg via INTRAVENOUS

## 2017-07-30 MED ORDER — SODIUM CHLORIDE 0.9 % IR SOLN
Status: DC | PRN
Start: 2017-07-30 — End: 2017-07-30
  Administered 2017-07-30: 3000 mL

## 2017-07-30 MED ORDER — DEXAMETHASONE SODIUM PHOSPHATE 10 MG/ML IJ SOLN
INTRAMUSCULAR | Status: AC
  Filled 2017-07-30: qty 1

## 2017-07-30 MED ORDER — SUGAMMADEX SODIUM 200 MG/2ML IV SOLN
INTRAVENOUS | Status: DC | PRN
Start: 2017-07-30 — End: 2017-07-30
  Administered 2017-07-30: 200 mg via INTRAVENOUS

## 2017-07-30 MED ORDER — ACETAMINOPHEN 10 MG/ML IV SOLN
INTRAVENOUS | Status: AC
  Filled 2017-07-30: qty 100

## 2017-07-30 MED ORDER — ASPIRIN EC 325 MG PO TBEC: 325.0000 mg | DELAYED_RELEASE_TABLET | Freq: Two times a day (BID) | ORAL | 0 refills | Status: DC

## 2017-07-30 MED ORDER — AMLODIPINE BESYLATE 10 MG PO TABS
10.0000 mg | ORAL_TABLET | Freq: Every day | ORAL | Status: DC
  Administered 2017-07-31 – 2017-08-01 (×2): 10 mg via ORAL
  Filled 2017-07-30 (×2): qty 1

## 2017-07-30 MED ORDER — PROPOFOL 10 MG/ML IV BOLUS
INTRAVENOUS | Status: DC | PRN
  Administered 2017-07-30: 160 mg via INTRAVENOUS

## 2017-07-30 MED ORDER — OXYCODONE HCL 5 MG/5ML PO SOLN: 5.0000 mg | Freq: Once | ORAL | Status: DC | PRN

## 2017-07-30 MED ORDER — SODIUM CHLORIDE 0.9 % IV SOLN
1000.0000 mg | Freq: Once | INTRAVENOUS | Status: AC
  Administered 2017-07-30: 1000 mg via INTRAVENOUS
  Filled 2017-07-30: qty 10

## 2017-07-30 MED ORDER — BUPIVACAINE-EPINEPHRINE (PF) 0.25% -1:200000 IJ SOLN
INTRAMUSCULAR | Status: AC
Start: 2017-07-30 — End: 2017-07-30
  Filled 2017-07-30: qty 30

## 2017-07-30 MED ORDER — CEFUROXIME SODIUM 1.5 G IV SOLR: INTRAVENOUS | Status: DC | PRN

## 2017-07-30 MED ORDER — ROCURONIUM BROMIDE 100 MG/10ML IV SOLN
INTRAVENOUS | Status: DC | PRN
  Administered 2017-07-30: 70 mg via INTRAVENOUS

## 2017-07-30 MED ORDER — PHENOL 1.4 % MT LIQD: 1.0000 | OROMUCOSAL | Status: DC | PRN

## 2017-07-30 MED ORDER — DEXAMETHASONE SODIUM PHOSPHATE 10 MG/ML IJ SOLN
INTRAMUSCULAR | Status: DC | PRN
  Administered 2017-07-30: 10 mg via INTRAVENOUS

## 2017-07-30 MED ORDER — ASPIRIN EC 325 MG PO TBEC: 325.0000 mg | DELAYED_RELEASE_TABLET | Freq: Every day | ORAL | Status: DC

## 2017-07-30 MED ORDER — BUPIVACAINE-EPINEPHRINE 0.25% -1:200000 IJ SOLN
INTRAMUSCULAR | Status: AC
Start: 2017-07-30 — End: 2017-07-30
  Filled 2017-07-30: qty 1

## 2017-07-30 MED ORDER — DIPHENHYDRAMINE HCL 12.5 MG/5ML PO ELIX: 12.5000 mg | ORAL_SOLUTION | ORAL | Status: DC | PRN

## 2017-07-30 MED ORDER — SENNOSIDES-DOCUSATE SODIUM 8.6-50 MG PO TABS: 1.0000 | ORAL_TABLET | Freq: Every evening | ORAL | Status: DC | PRN

## 2017-07-30 MED ORDER — FENTANYL CITRATE (PF) 100 MCG/2ML IJ SOLN
INTRAMUSCULAR | Status: DC | PRN
  Administered 2017-07-30: 100 ug via INTRAVENOUS
  Administered 2017-07-30 (×3): 50 ug via INTRAVENOUS

## 2017-07-30 MED ORDER — GABAPENTIN 300 MG PO CAPS
300.0000 mg | ORAL_CAPSULE | Freq: Three times a day (TID) | ORAL | Status: DC
  Administered 2017-07-30 – 2017-08-01 (×6): 300 mg via ORAL
  Filled 2017-07-30 (×7): qty 1

## 2017-07-30 MED ORDER — METHOCARBAMOL 1000 MG/10ML IJ SOLN: 500.0000 mg | Freq: Four times a day (QID) | INTRAVENOUS | Status: DC | PRN

## 2017-07-30 MED ORDER — ACETAMINOPHEN 10 MG/ML IV SOLN
INTRAVENOUS | Status: DC | PRN
  Administered 2017-07-30: 1000 mg via INTRAVENOUS

## 2017-07-30 MED ORDER — BISACODYL 5 MG PO TBEC
5.0000 mg | DELAYED_RELEASE_TABLET | Freq: Every day | ORAL | Status: DC | PRN
  Administered 2017-07-30: 5 mg via ORAL
  Filled 2017-07-30: qty 1

## 2017-07-30 MED ORDER — HYDROMORPHONE HCL 1 MG/ML IJ SOLN
INTRAMUSCULAR | Status: AC
  Administered 2017-07-30: 0.5 mg via INTRAVENOUS
  Filled 2017-07-30: qty 1

## 2017-07-30 MED ORDER — OXYCODONE HCL 5 MG PO TABS: 5.0000 mg | ORAL_TABLET | Freq: Once | ORAL | Status: DC | PRN

## 2017-07-30 MED ORDER — HYDROCHLOROTHIAZIDE 12.5 MG PO CAPS
12.5000 mg | ORAL_CAPSULE | Freq: Every day | ORAL | Status: DC
  Administered 2017-07-31 – 2017-08-01 (×2): 12.5 mg via ORAL
  Filled 2017-07-30 (×2): qty 1

## 2017-07-30 MED ORDER — MENTHOL 3 MG MT LOZG: 1.0000 | LOZENGE | OROMUCOSAL | Status: DC | PRN

## 2017-07-30 MED ORDER — METOCLOPRAMIDE HCL 5 MG/ML IJ SOLN: 5.0000 mg | Freq: Three times a day (TID) | INTRAMUSCULAR | Status: DC | PRN

## 2017-07-30 MED ORDER — HYDROMORPHONE HCL 1 MG/ML IJ SOLN
0.2500 mg | INTRAMUSCULAR | Status: DC | PRN
  Administered 2017-07-30 (×4): 0.5 mg via INTRAVENOUS

## 2017-07-30 MED ORDER — KCL IN DEXTROSE-NACL 20-5-0.45 MEQ/L-%-% IV SOLN
INTRAVENOUS | Status: DC
  Administered 2017-07-30: 13:00:00 via INTRAVENOUS
  Filled 2017-07-30: qty 1000

## 2017-07-30 MED ORDER — HYDROMORPHONE HCL 1 MG/ML IJ SOLN
0.2500 mg | INTRAMUSCULAR | Status: DC | PRN
  Administered 2017-07-30 (×2): 0.5 mg via INTRAVENOUS

## 2017-07-30 MED ORDER — ONDANSETRON HCL 4 MG/2ML IJ SOLN
INTRAMUSCULAR | Status: AC
  Filled 2017-07-30: qty 2

## 2017-07-30 MED ORDER — BUPIVACAINE-EPINEPHRINE 0.25% -1:200000 IJ SOLN
INTRAMUSCULAR | Status: DC | PRN
  Administered 2017-07-30: 50 mL

## 2017-07-30 MED ORDER — FLEET ENEMA 7-19 GM/118ML RE ENEM: 1.0000 | ENEMA | Freq: Once | RECTAL | Status: DC | PRN

## 2017-07-30 MED ORDER — KETAMINE HCL-SODIUM CHLORIDE 100-0.9 MG/10ML-% IV SOSY
PREFILLED_SYRINGE | INTRAVENOUS | Status: AC
  Filled 2017-07-30: qty 10

## 2017-07-30 MED ORDER — CEFUROXIME SODIUM 1.5 G IV SOLR
INTRAVENOUS | Status: AC
  Filled 2017-07-30: qty 1.5

## 2017-07-30 MED ORDER — LISINOPRIL 20 MG PO TABS
20.0000 mg | ORAL_TABLET | Freq: Every day | ORAL | Status: DC
  Administered 2017-07-31 – 2017-08-01 (×2): 20 mg via ORAL
  Filled 2017-07-30 (×2): qty 1

## 2017-07-30 MED ORDER — ONDANSETRON HCL 4 MG/2ML IJ SOLN: 4.0000 mg | Freq: Four times a day (QID) | INTRAMUSCULAR | Status: DC | PRN

## 2017-07-30 MED ORDER — PROPOFOL 10 MG/ML IV BOLUS
INTRAVENOUS | Status: AC
Start: 2017-07-30 — End: 2017-07-30
  Filled 2017-07-30: qty 20

## 2017-07-30 MED ORDER — FENTANYL CITRATE (PF) 250 MCG/5ML IJ SOLN
INTRAMUSCULAR | Status: AC
  Filled 2017-07-30: qty 5

## 2017-07-30 MED ORDER — ONDANSETRON HCL 4 MG/2ML IJ SOLN
INTRAMUSCULAR | Status: DC | PRN
Start: 2017-07-30 — End: 2017-07-30
  Administered 2017-07-30: 4 mg via INTRAVENOUS

## 2017-07-30 MED ORDER — OXYCODONE-ACETAMINOPHEN 5-325 MG PO TABS: 1.0000 | ORAL_TABLET | ORAL | 0 refills | Status: DC | PRN

## 2017-07-30 MED ORDER — SODIUM CHLORIDE 0.9 % IJ SOLN
INTRAMUSCULAR | Status: DC | PRN
  Administered 2017-07-30: 50 mL

## 2017-07-30 MED ORDER — LIDOCAINE 2% (20 MG/ML) 5 ML SYRINGE
INTRAMUSCULAR | Status: DC | PRN
  Administered 2017-07-30: 60 mg via INTRAVENOUS

## 2017-07-30 MED ORDER — TIZANIDINE HCL 2 MG PO TABS: 2.0000 mg | ORAL_TABLET | Freq: Four times a day (QID) | ORAL | 0 refills | Status: DC | PRN

## 2017-07-30 MED ORDER — CELECOXIB 200 MG PO CAPS
200.0000 mg | ORAL_CAPSULE | Freq: Two times a day (BID) | ORAL | Status: DC
  Administered 2017-07-30 – 2017-08-01 (×5): 200 mg via ORAL
  Filled 2017-07-30 (×5): qty 1

## 2017-07-30 MED ORDER — METOCLOPRAMIDE HCL 5 MG PO TABS: 5.0000 mg | ORAL_TABLET | Freq: Three times a day (TID) | ORAL | Status: DC | PRN

## 2017-07-30 MED ORDER — DOCUSATE SODIUM 100 MG PO CAPS
100.0000 mg | ORAL_CAPSULE | Freq: Two times a day (BID) | ORAL | Status: DC
  Administered 2017-07-30 – 2017-08-01 (×4): 100 mg via ORAL
  Filled 2017-07-30 (×4): qty 1

## 2017-07-30 MED ORDER — METHOCARBAMOL 500 MG PO TABS
500.0000 mg | ORAL_TABLET | Freq: Four times a day (QID) | ORAL | Status: DC | PRN
  Administered 2017-07-30 – 2017-07-31 (×2): 500 mg via ORAL
  Filled 2017-07-30: qty 1

## 2017-07-30 MED ORDER — ALUM & MAG HYDROXIDE-SIMETH 200-200-20 MG/5ML PO SUSP: 30.0000 mL | ORAL | Status: DC | PRN

## 2017-07-30 MED ORDER — MIDAZOLAM HCL 5 MG/5ML IJ SOLN
INTRAMUSCULAR | Status: DC | PRN
  Administered 2017-07-30: 2 mg via INTRAVENOUS

## 2017-07-30 MED ORDER — ROPIVACAINE HCL 7.5 MG/ML IJ SOLN
INTRAMUSCULAR | Status: DC | PRN
  Administered 2017-07-30: 20 mL via PERINEURAL

## 2017-07-30 MED ORDER — CHLORHEXIDINE GLUCONATE 4 % EX LIQD: 60.0000 mL | Freq: Once | CUTANEOUS | Status: DC

## 2017-07-30 MED ORDER — DEXAMETHASONE SODIUM PHOSPHATE 10 MG/ML IJ SOLN
10.0000 mg | Freq: Once | INTRAMUSCULAR | Status: AC
  Administered 2017-07-31: 10 mg via INTRAVENOUS
  Filled 2017-07-30: qty 1

## 2017-07-30 MED ORDER — LISINOPRIL-HYDROCHLOROTHIAZIDE 20-12.5 MG PO TABS: 1.0000 | ORAL_TABLET | Freq: Two times a day (BID) | ORAL | Status: DC

## 2017-07-30 MED ORDER — MIDAZOLAM HCL 2 MG/2ML IJ SOLN
INTRAMUSCULAR | Status: AC
  Filled 2017-07-30: qty 2

## 2017-07-30 SURGICAL SUPPLY — 67 items
ATTUNE PSRP INSR SZ5 5 KNEE (Insert) ×2 IMPLANT
BANDAGE ACE 4X5 VEL STRL LF (GAUZE/BANDAGES/DRESSINGS) ×2 IMPLANT
BANDAGE ACE 6X5 VEL STRL LF (GAUZE/BANDAGES/DRESSINGS) ×2 IMPLANT
BANDAGE ESMARK 6X9 LF (GAUZE/BANDAGES/DRESSINGS) ×1 IMPLANT
BLADE CLIPPER SURG (BLADE) IMPLANT
BLADE SAG 18X100X1.27 (BLADE) ×2 IMPLANT
BLADE SAW SAG 90X13X1.27 (BLADE) ×2 IMPLANT
BNDG ELASTIC 6X10 VLCR STRL LF (GAUZE/BANDAGES/DRESSINGS) ×2 IMPLANT
BNDG ESMARK 6X9 LF (GAUZE/BANDAGES/DRESSINGS) ×2
BOWL SMART MIX CTS (DISPOSABLE) ×2 IMPLANT
CEMENT HV SMART SET (Cement) IMPLANT
COVER BACK TABLE 24X17X13 BIG (DRAPES) IMPLANT
COVER SURGICAL LIGHT HANDLE (MISCELLANEOUS) ×2 IMPLANT
CUFF TOURNIQUET SINGLE 34IN LL (TOURNIQUET CUFF) ×2 IMPLANT
CUFF TOURNIQUET SINGLE 44IN (TOURNIQUET CUFF) IMPLANT
DISC DIAMOND MED (BURR) IMPLANT
DRAPE EXTREMITY T 121X128X90 (DRAPE) ×2 IMPLANT
DRAPE HALF SHEET 40X57 (DRAPES) ×2 IMPLANT
DRAPE IMP U-DRAPE 54X76 (DRAPES) ×2 IMPLANT
DRAPE U-SHAPE 47X51 STRL (DRAPES) ×2 IMPLANT
DRSG AQUACEL AG ADV 3.5X10 (GAUZE/BANDAGES/DRESSINGS) ×2 IMPLANT
DURAPREP 26ML APPLICATOR (WOUND CARE) ×2 IMPLANT
ELECT REM PT RETURN 9FT ADLT (ELECTROSURGICAL) ×2
ELECTRODE REM PT RTRN 9FT ADLT (ELECTROSURGICAL) ×1 IMPLANT
EVACUATOR 1/8 PVC DRAIN (DRAIN) IMPLANT
GAUZE SPONGE 4X4 12PLY STRL (GAUZE/BANDAGES/DRESSINGS) ×4 IMPLANT
GAUZE XEROFORM 1X8 LF (GAUZE/BANDAGES/DRESSINGS) ×2 IMPLANT
GLOVE BIO SURGEON STRL SZ7.5 (GLOVE) ×2 IMPLANT
GLOVE BIO SURGEON STRL SZ8.5 (GLOVE) ×4 IMPLANT
GLOVE BIOGEL PI IND STRL 8 (GLOVE) ×2 IMPLANT
GLOVE BIOGEL PI IND STRL 9 (GLOVE) ×1 IMPLANT
GLOVE BIOGEL PI INDICATOR 8 (GLOVE) ×2
GLOVE BIOGEL PI INDICATOR 9 (GLOVE) ×1
GOWN STRL REUS W/ TWL LRG LVL3 (GOWN DISPOSABLE) ×1 IMPLANT
GOWN STRL REUS W/ TWL XL LVL3 (GOWN DISPOSABLE) ×3 IMPLANT
GOWN STRL REUS W/TWL LRG LVL3 (GOWN DISPOSABLE) ×1
GOWN STRL REUS W/TWL XL LVL3 (GOWN DISPOSABLE) ×3
HANDPIECE INTERPULSE COAX TIP (DISPOSABLE) ×1
HOOD PEEL AWAY FACE SHEILD DIS (HOOD) ×4 IMPLANT
KIT BASIN OR (CUSTOM PROCEDURE TRAY) ×2 IMPLANT
KIT ROOM TURNOVER OR (KITS) ×2 IMPLANT
MANIFOLD NEPTUNE II (INSTRUMENTS) ×2 IMPLANT
NEEDLE SPNL 18GX3.5 QUINCKE PK (NEEDLE) ×2 IMPLANT
NS IRRIG 1000ML POUR BTL (IV SOLUTION) ×2 IMPLANT
PACK TOTAL JOINT (CUSTOM PROCEDURE TRAY) ×2 IMPLANT
PACK UNIVERSAL I (CUSTOM PROCEDURE TRAY) ×2 IMPLANT
PAD ARMBOARD 7.5X6 YLW CONV (MISCELLANEOUS) ×4 IMPLANT
PAD CAST 4YDX4 CTTN HI CHSV (CAST SUPPLIES) ×1 IMPLANT
PADDING CAST COTTON 4X4 STRL (CAST SUPPLIES) ×1
PADDING CAST COTTON 6X4 STRL (CAST SUPPLIES) ×2 IMPLANT
RASP HELIOCORDIAL MED (MISCELLANEOUS) IMPLANT
SET HNDPC FAN SPRY TIP SCT (DISPOSABLE) ×1 IMPLANT
STAPLER VISISTAT 35W (STAPLE) ×2 IMPLANT
SUCTION FRAZIER HANDLE 10FR (MISCELLANEOUS) ×1
SUCTION TUBE FRAZIER 10FR DISP (MISCELLANEOUS) ×1 IMPLANT
SUT VIC AB 0 CT1 27 (SUTURE) ×1
SUT VIC AB 0 CT1 27XBRD ANBCTR (SUTURE) ×1 IMPLANT
SUT VIC AB 1 CTX 36 (SUTURE) ×1
SUT VIC AB 1 CTX36XBRD ANBCTR (SUTURE) ×1 IMPLANT
SUT VIC AB 2-0 CT1 27 (SUTURE) ×1
SUT VIC AB 2-0 CT1 TAPERPNT 27 (SUTURE) ×1 IMPLANT
SWAB CULTURE ESWAB REG 1ML (MISCELLANEOUS) ×2 IMPLANT
SYR 50ML LL SCALE MARK (SYRINGE) ×2 IMPLANT
TOWEL OR 17X24 6PK STRL BLUE (TOWEL DISPOSABLE) ×2 IMPLANT
TOWEL OR 17X26 10 PK STRL BLUE (TOWEL DISPOSABLE) ×2 IMPLANT
TRAY FOLEY CATH SILVER 14FR (SET/KITS/TRAYS/PACK) IMPLANT
WATER STERILE IRR 1000ML POUR (IV SOLUTION) IMPLANT

## 2017-07-30 NOTE — Transfer of Care (Signed)
Immediate Anesthesia Transfer of Care Note  Patient: Keith Vance  Procedure(s) Performed: LEFT TOTAL KNEE PARTIAL REVISION AND SYNOVECTOMY (Left Knee)  Patient Location: PACU  Anesthesia Type:General  Level of Consciousness: awake, alert  and oriented  Airway & Oxygen Therapy: Patient Spontanous Breathing and Patient connected to face mask oxygen  Post-op Assessment: Report given to RN and Post -op Vital signs reviewed and stable  Post vital signs: Reviewed and stable  Last Vitals:  Vitals:   07/30/17 0616  BP: (!) 144/93  Pulse: 67  Resp: 20  Temp: 36.8 C  SpO2: 95%    Last Pain:  Vitals:   07/30/17 0616  TempSrc: Oral  PainSc: 5       Patients Stated Pain Goal: 3 (07/30/17 [id number])  Complications: No apparent anesthesia complications

## 2017-07-30 NOTE — Care Management Note (Addendum)
Case Management Note  Patient Details  Name: Keith Vance MRN: [id number] Date of Birth: 07-10-1961  Subjective/Objective:     Left TKA               Action/Plan: Discharge Planning: NCM spoke to pt and wife at bedside. Pt has RW with seat at home. His home is handicap accessible. His Generic Worker's Comp Berniece AndreasCW, Karen Watts [phone], fax [phone]. [facility] is arranging HH PT and OT. Orders were submitted to [facility] from the office. [facility] rep, Nicole CellaDorothy, [phone] ext [phone] fax [phone]. Will fax dc summary when it becomes available. Claim # [id number]  PCP [facility]   Expected Discharge Date:                 Expected Discharge Plan:  Home w Home Health Services  In-House Referral:  NA  Discharge planning Services  CM Consult  Post Acute Care Choice:  Home Health Choice offered to:  Patient  DME Arranged:  N/A DME Agency:  NA  HH Arranged:  PT, OT HH Agency:  Other - See comment  Status of Service:  Completed, signed off  If discussed at Long Length of Stay Meetings, dates discussed:    Additional Comments:  Elliot CousinShavis, Leahann Lempke Ellen, RN 07/30/2017, 3:56 PM

## 2017-07-30 NOTE — Anesthesia Procedure Notes (Signed)
Anesthesia Regional Block: Adductor canal block   Pre-Anesthetic Checklist: ,, timeout performed, Correct Patient, Correct Site, Correct Laterality, Correct Procedure, Correct Position, site marked, Risks and benefits discussed,  Surgical consent,  Pre-op evaluation,  At surgeon's request and post-op pain management  Laterality: Lower and Left  Prep: chloraprep       Needles:  Injection technique: Single-shot  Needle Type: Echogenic Stimulator Needle          Additional Needles:   Procedures:,,,, ultrasound used (permanent image in chart),,,,  Narrative:  Start time: 07/30/2017 9:15 AM End time: 07/30/2017 9:18 AM Injection made incrementally with aspirations every 5 mL.  Performed by: Personally  Anesthesiologist: Val EagleMoser, Briteny Fulghum, MD  Additional Notes: H+P and labs reviewed, risks and benefits discussed with patient, procedure tolerated well without complications

## 2017-07-30 NOTE — Progress Notes (Addendum)
Nurse at patient's bedside to assess dressing. ACE wrap has a few spots of bleeding noted to the outside. Nurse unwrapped to assess site and patient still actively bleeding from site. Called the office to page someone with Dr. Turner Danielsowan, no response still at this time. Pressure held and dressing reapplied. VSS. No s/s of distress. Denies pain at this time.

## 2017-07-30 NOTE — Evaluation (Signed)
Physical Therapy Evaluation Patient Details Name: Keith Vance MRN: [id number] DOB: 21-Mar-1961 Today's Date: 07/30/2017   History of Present Illness  56 y.o. male s/p LEFT TOTAL KNEE PARTIAL REVISION AND SYNOVECTOMY (Left Knee) 12/3. PMH includes: HTN, chest pain, thoracic aortic aneurysm,     Clinical Impression  Patient is s/p above surgery resulting in functional limitations due to the deficits listed below (see PT Problem List). PTA, patient was mod I with mobility without AD. Pt lives with wife who is avail 24/7 for support. Upon eval, patient presents with minimal post op pain and weakness, and is currently supervision guard level with all mobility. Session focused on gait training and therex. Anticipate quick progression of functional goals working towards increasing activity tolerance and level of independence.  Patient will benefit from skilled PT to increase their independence and safety with mobility to allow discharge to the venue listed below.       Follow Up Recommendations Home health PT;Supervision - Intermittent;DC plan and follow up therapy as arranged by surgeon    Equipment Recommendations  None recommended by PT    Recommendations for Other Services       Precautions / Restrictions Precautions Precautions: Fall;Knee Precaution Booklet Issued: Yes (comment) Precaution Comments: reviewed no pillow under knee, knee extension, supine therex Restrictions Weight Bearing Restrictions: Yes LLE Weight Bearing: Weight bearing as tolerated      Mobility  Bed Mobility Overal bed mobility: Modified Independent                Transfers Overall transfer level: Needs assistance Equipment used: Rolling walker (2 wheeled) Transfers: Sit to/from Stand Sit to Stand: Min guard         General transfer comment: Min Guard for safety and cues with hand placement for RW.   Ambulation/Gait Ambulation/Gait assistance: Min guard Ambulation Distance (Feet): 150  Feet Assistive device: Rolling walker (2 wheeled) Gait Pattern/deviations: Step-through pattern;Antalgic Gait velocity: decreased   General Gait Details: min guard for ambulation with RW, patient performing equal step lengths after cueing with good heel strike BLE.   Stairs            Wheelchair Mobility    Modified Rankin (Stroke Patients Only)       Balance Overall balance assessment: Needs assistance Sitting-balance support: Feet unsupported;No upper extremity supported Sitting balance-Leahy Scale: Good     Standing balance support: During functional activity;Single extremity supported;Bilateral upper extremity supported Standing balance-Leahy Scale: Fair Standing balance comment: safer with UE support during functional tasks.                             Pertinent Vitals/Pain Pain Assessment: Faces Faces Pain Scale: Hurts a little bit Pain Location: L KNEE Pain Descriptors / Indicators: Aching;Grimacing;Guarding Pain Intervention(s): Limited activity within patient's tolerance;Monitored during session;Premedicated before session    Home Living Family/patient expects to be discharged to:: Private residence Living Arrangements: Spouse/significant other Available Help at Discharge: Family;Available 24 hours/day(Wife avail 24/7 first 3 days then back to work, friends PRN) Type of Home: House Home Access: Ramped entrance     Home Layout: One level Home Equipment: Environmental consultantWalker - 2 wheels;Bedside commode;Cane - single point      Prior Function Level of Independence: Independent         Comments: rehabed from TKA to OP therapy and no AD prior to coming back for revision.      Hand Dominance  Extremity/Trunk Assessment   Upper Extremity Assessment Upper Extremity Assessment: Overall WFL for tasks assessed;Defer to OT evaluation    Lower Extremity Assessment Lower Extremity Assessment: Overall WFL for tasks assessed(Strength: LLE 3+/5. RLE=  WNL)    Cervical / Trunk Assessment Cervical / Trunk Assessment: Normal  Communication   Communication: No difficulties  Cognition Arousal/Alertness: Awake/alert Behavior During Therapy: WFL for tasks assessed/performed Overall Cognitive Status: Within Functional Limits for tasks assessed                                        General Comments General comments (skin integrity, edema, etc.): VSS throughout session.    Exercises Total Joint Exercises Ankle Circles/Pumps: AROM;Both;10 reps Heel Slides: AROM;Left;10 reps Hip ABduction/ADduction: AAROM;Both;10 reps Straight Leg Raises: AROM;Both;10 reps Long Arc Quad: AROM;Both;10 reps   Assessment/Plan    PT Assessment Patient needs continued PT services  PT Problem List Decreased strength;Decreased range of motion;Decreased activity tolerance;Decreased balance;Decreased mobility;Pain       PT Treatment Interventions DME instruction;Gait training;Stair training;Functional mobility training;Therapeutic activities;Therapeutic exercise    PT Goals (Current goals can be found in the Care Plan section)  Acute Rehab PT Goals Patient Stated Goal: to return home with HHPT PT Goal Formulation: With patient Time For Goal Achievement: 08/06/17 Potential to Achieve Goals: Good    Frequency 7X/week   Barriers to discharge        Co-evaluation               AM-PAC PT "6 Clicks" Daily Activity  Outcome Measure Difficulty turning over in bed (including adjusting bedclothes, sheets and blankets)?: A Little Difficulty moving from lying on back to sitting on the side of the bed? : A Little Difficulty sitting down on and standing up from a chair with arms (e.g., wheelchair, bedside commode, etc,.)?: A Little Help needed moving to and from a bed to chair (including a wheelchair)?: A Little Help needed walking in hospital room?: A Little Help needed climbing 3-5 steps with a railing? : A Little 6 Click Score:  18    End of Session Equipment Utilized During Treatment: Gait belt Activity Tolerance: Patient tolerated treatment well Patient left: in bed;with call bell/phone within reach;with nursing/sitter in room Nurse Communication: Mobility status PT Visit Diagnosis: Unsteadiness on feet (R26.81);Other abnormalities of gait and mobility (R26.89);Muscle weakness (generalized) (M62.81);Pain Pain - Right/Left: Left Pain - part of body: Knee    Time: 1230-1308 PT Time Calculation (min) (ACUTE ONLY): 38 min   Charges:   PT Evaluation $PT Eval Low Complexity: 1 Low PT Treatments $Gait Training: 8-22 mins $Therapeutic Exercise: 8-22 mins   PT G Codes:       Etta GrandchildSean Aarian Griffie, PT, DPT Acute Rehab Services Pager: [phone]    Etta GrandchildSean  Kamuela Magos 07/30/2017, 1:32 PM

## 2017-07-30 NOTE — Progress Notes (Signed)
Placed patient on CPAP machine tolerating well, RCP will continue to monitor, SpO2 96%, no distress at this time.

## 2017-07-30 NOTE — Progress Notes (Signed)
RN called to bedside by NT. Patient left knee bleeding through dressing, sheets and gown covered in bright red blood. Nurse unwrapped ACE from left leg and applied pressure to incision site. Incision site actively bleeding upon assessment. Called nursing help to bedside for pressure dressing. Steri strips applied to help hold incision. Spoke with Dannielle BurnEric Phillips, PA. States to apply new Aquacel dressing after bleeding slowed. Bleeding slowed at this time. Pressure dressing with ACE wrap applied. Patient currently sitting up in chair. VSS. No s/s of distress.

## 2017-07-30 NOTE — Progress Notes (Deleted)
Called by Rn for patient recently returned from surgery and is lethargic, confused and dropping oxygen sats.  On my arrival to bedside, Rn, Rt and family present.  Patient in bed on NRB sats 100%.  RT switching him to venti mask to see if sats tolerate wean.  Oxygen now back on 3 LPM and tolerating it. Patient is easily aroused and pleasantly confused.  RN has paged MD waiting for call back.  NO RRt interventions.  RN to call if assistance needed

## 2017-07-30 NOTE — Anesthesia Procedure Notes (Signed)
Procedure Name: Intubation Date/Time: 07/30/2017 7:42 AM Performed by: Pearson Grippeobertson, Shardae Kleinman M, CRNA Pre-anesthesia Checklist: Patient identified, Emergency Drugs available, Suction available and Patient being monitored Patient Re-evaluated:Patient Re-evaluated prior to induction Oxygen Delivery Method: Circle system utilized Preoxygenation: Pre-oxygenation with 100% oxygen Induction Type: IV induction Ventilation: Mask ventilation without difficulty Laryngoscope Size: Miller and 3 Grade View: Grade II Tube type: Oral Tube size: 7.5 mm Number of attempts: 1 Airway Equipment and Method: Stylet and Oral airway Placement Confirmation: ETT inserted through vocal cords under direct vision,  positive ETCO2 and breath sounds checked- equal and bilateral Secured at: 23 cm Tube secured with: Tape Dental Injury: Teeth and Oropharynx as per pre-operative assessment

## 2017-07-30 NOTE — Interval H&P Note (Signed)
History and Physical Interval Note:  07/30/2017 7:07 AM  Keith Vance  has presented today for surgery, with the diagnosis of LOOSE LEFT TOTAL KNEE ARTHROPLASTY  The various methods of treatment have been discussed with the patient and family. After consideration of risks, benefits and other options for treatment, the patient has consented to  Procedure(s): LEFT TOTAL KNEE REVISION (Left) as a surgical intervention .  The patient's history has been reviewed, patient examined, no change in status, stable for surgery.  I have reviewed the patient's chart and labs.  Questions were answered to the patient's satisfaction.     Nestor LewandowskyFrank J Rae Plotner

## 2017-07-30 NOTE — Progress Notes (Signed)
Orthopedic Tech Progress Note Patient Details:  Keith Vance Feb 19, 1961 [id number]  Ortho Devices Type of Ortho Device: (footsie roll) Ortho Device/Splint Location: lle Ortho Device/Splint Interventions: Application   Post Interventions Patient Tolerated: Well Instructions Provided: Care of device   [name], [name] 07/30/2017, 10:02 AM

## 2017-07-30 NOTE — Progress Notes (Signed)
Spoke with Dannielle BurnEric Phillips, PA. Nurse explained that patient was continuing to bleed through pressure dressing. Orders to apply new pressure dressing and leave overnight and reinforce as needed. Charge nurse at bedside to also look at site and Facilities managernurse manager also at bedside helping on the unit. Dannielle BurnEric Phillips notified again of bleeding concern and the need for someone to come in and assess site. States that he will speak with Dr. Turner Danielsowan and go from there. Patient's VSS. No s/s of distress. Wife at bedside.

## 2017-07-30 NOTE — Progress Notes (Signed)
Keith BurnEric Phillips, PA at bedside to evaluate patient's incision site to left knee. Dressing removed, active bleeding still noted but has slowed. Compression dressing applied by physician's assistant. States to reinforce dressing overnight, and that Dr. Turner Danielsowan will reassess in the morning. VSS. No s/s of distress noted.

## 2017-07-30 NOTE — Anesthesia Postprocedure Evaluation (Signed)
Anesthesia Post Note  Patient: Keith Vance  Procedure(s) Performed: LEFT TOTAL KNEE PARTIAL REVISION AND SYNOVECTOMY (Left Knee)     Patient location during evaluation: PACU Anesthesia Type: Regional and General Level of consciousness: awake and alert Pain management: pain level controlled Vital Signs Assessment: post-procedure vital signs reviewed and stable Respiratory status: spontaneous breathing, nonlabored ventilation, respiratory function stable and patient connected to nasal cannula oxygen Cardiovascular status: blood pressure returned to baseline and stable Postop Assessment: no apparent nausea or vomiting Anesthetic complications: no    Last Vitals:  Vitals:   07/30/17 1024 07/30/17 1040  BP: 111/77 127/90  Pulse: 70 71  Resp: 12 16  Temp: 36.5 C (!) 36.4 C  SpO2: 94% 96%    Last Pain:  Vitals:   07/30/17 1052  TempSrc:   PainSc: 7                  Jawan Chavarria

## 2017-07-30 NOTE — Anesthesia Preprocedure Evaluation (Addendum)
Anesthesia Evaluation  Patient identified by MRN, date of birth, ID band Patient awake    Reviewed: Allergy & Precautions, NPO status , Patient's Chart, lab work & pertinent test results  History of Anesthesia Complications Negative for: history of anesthetic complications  Airway Mallampati: II  TM Distance: >3 FB Neck ROM: Full    Dental  (+) Teeth Intact   Pulmonary sleep apnea and Continuous Positive Airway Pressure Ventilation , former smoker,    breath sounds clear to auscultation       Cardiovascular hypertension, Pt. on medications (-) angina+ Peripheral Vascular Disease  (-) Past MI and (-) CHF (-) dysrhythmias  Rhythm:Regular     Neuro/Psych negative neurological ROS  negative psych ROS   GI/Hepatic Neg liver ROS, GERD  Controlled,  Endo/Other  Morbid obesity  Renal/GU negative Renal ROS     Musculoskeletal  (+) Arthritis ,   Abdominal   Peds  Hematology   Anesthesia Other Findings ptt 41 then 40  Reproductive/Obstetrics                                                             Anesthesia Evaluation  Patient identified by MRN, date of birth, ID band Patient awake    Reviewed: Allergy & Precautions, H&P , Patient's Chart, lab work & pertinent test results  Airway Mallampati: II  TM Distance: >3 FB Neck ROM: full    Dental no notable dental hx.    Pulmonary former smoker,    Pulmonary exam normal breath sounds clear to auscultation       Cardiovascular Exercise Tolerance: Good hypertension,  Rhythm:regular Rate:Normal     Neuro/Psych    GI/Hepatic   Endo/Other    Renal/GU      Musculoskeletal   Abdominal   Peds  Hematology   Anesthesia Other Findings Hypertension; controlled. Recent EF 60% on echo   Hyperlipidemia   Arthritis    Sleep apnea   SBO (small bowel obstruction)   a. 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  Ascending aortic aneurysm ( 10/2015  CTA chest: 4.4cm aneurysmal. Pneumonia         Reproductive/Obstetrics                             Anesthesia Physical Anesthesia Plan  ASA: III  Anesthesia Plan: Spinal   Post-op Pain Management:    Induction:   Airway Management Planned:   Additional Equipment:   Intra-op Plan:   Post-operative Plan:   Informed Consent: I have reviewed the patients History and Physical, chart, labs and discussed the procedure including the risks, benefits and alternatives for the proposed anesthesia with the patient or authorized representative who has indicated his/her understanding and acceptance.   Dental Advisory Given  Plan Discussed with: CRNA  Anesthesia Plan Comments: (Lab work confirmed with CRNA in room. Platelets okay. Discussed spinal anesthetic, and patient consents to the procedure:  included risk of possible headache,backache, failed block, allergic reaction, and nerve injury. This patient was asked if she had any questions or concerns before the procedure started. )        Anesthesia Quick Evaluation  Anesthesia Physical Anesthesia Plan  ASA: II  Anesthesia Plan: General and Regional   Post-op Pain Management:  Regional for Post-op pain  Induction: Intravenous  PONV Risk Score and Plan: 2 and Ondansetron, Dexamethasone and Treatment may vary due to age or medical condition  Airway Management Planned: Oral ETT  Additional Equipment: None  Intra-op Plan:   Post-operative Plan: Extubation in OR  Informed Consent: I have reviewed the patients History and Physical, chart, labs and discussed the procedure including the risks, benefits and alternatives for the proposed anesthesia with the patient or authorized representative who has indicated his/her understanding and acceptance.   Dental advisory given  Plan Discussed with: CRNA and Surgeon  Anesthesia Plan Comments:         Anesthesia Quick Evaluation

## 2017-07-30 NOTE — Op Note (Addendum)
PATIENT ID:      Keith Vance  MRN:     [id number] DOB/AGE:    1961-07-05 / 56 y.o.       OPERATIVE REPORT    DATE OF PROCEDURE:  07/30/2017       PREOPERATIVE DIAGNOSIS:   Painfull LEFT TOTAL KNEE ARTHROPLASTY, recurrent effusions and possible loosening on bone scan plain radiographs were nondiagnostic     Estimated body mass index is 40.94 kg/m as calculated from the following:   Height as of this encounter: 5\' 10"  (1.778 m).   Weight as of this encounter: 285 lb 4.8 oz (129.4 kg).                                                        POSTOPERATIVE DIAGNOSIS:   Painful left total knee arthroplasty with inflamed synovium, femoral tibial and patellar implants were not loose.                                                                     PROCEDURE:  Procedure(s): LEFT TOTAL KNEE PARTIAL REVISION AND SYNOVECTOMY, placement of new 5 mm size 5 RP bearing   SURGEON: Nestor LewandowskyFrank J Analiya Porco    ASSISTANT:   Tomi LikensEric K. [employer] Energy[employer]illips PA-C   (Present and scrubbed throughout the case, critical for assistance with exposure, retraction, instrumentation, and closure.)         ANESTHESIA: General, 20cc Exparel, 50cc 0.25% Marcaine  EBL: 300 cc  FLUID REPLACEMENT: 1500 cc crystalloid  TOURNIQUET TIME: 15min  Drains: None  Tranexamic Acid: 1gm IV, 2gm topical  COMPLICATIONS:  None         INDICATIONS FOR PROCEDURE: The patient had primary left total knee arthroplasty 1 year ago was injured at work in June and has had recurrent effusions since then inflammatory markers have been negative fluid analysis was not consistent with infection plan radiographs are also nondiagnostic but bone scan showed synovitis and increased uptake along the femoral condyles.  His pain is persisted he is limping.  He desires exploration and revision of any loose components.  He understands that there is a different risk that the parts will not be loose.  At a minimum he will undergo a radical synovectomy and revision of the polyethylene  bearing.   Risks and benefits of surgery have been discussed, questions answered.   DESCRIPTION OF PROCEDURE: The patient identified by armband, received  IV antibiotics, [facility]. Patient taken to the operating room, appropriate anesthetic  monitors were attached, and general anesthesia was  induced. Tourniquet  applied high to the operative thigh. Lateral post and foot positioner  applied to the table, the lower extremity was then prepped and draped  in usual sterile fashion from the toes to the tourniquet. Time-out procedure was performed. We began the operation, with the knee flexed 120 degrees, by making the anterior midline incision starting at handbreadth above the patella going over the patella 1 cm medial to and 4 cm distal to the tibial tubercle. Small bleeders in the skin and the  subcutaneous tissue identified and cauterized. Transverse retinaculum was incised and reflected medially and a medial parapatellar arthrotomy was accomplished. the patella was everted, and held out of the way using a skin rake and a Personal assistantHohmann retractor by my assistant Dannielle BurnEric Phillips.Theprepatellar fat pad resected. The superficial medial collateral ligament was then elevated from anterior to posterior along the proximal flare of the tibia exposing the tibial implant and the tibial RP bearing as well as the medial side of the femoral component.  Using rongeurs and electrocautery we stripped the synovial lining to expose the interface between the femoral implant tibial implant and patellar implant.  We released scar tissue and allowing us to evert the patella and remove osteophytes from around the patella which had grown.  We also removed osteophytes from around the posterior medial tibia that were about a centimeter in depth.  With the knee hyperflexed using a combination of a PCL retractor and a medial Hohmann retractor, held by my assistant Dannielle BurnEric Phillips PA-C, to translate the tibia  anteriorly from out underneath the femur, we then cut the post on the tibial bearing allowing us to expose the posterior aspect of the total knee and removed osteophytes from the posterior lateral femoral condyle which were restricting flexion.  Once all the osteophytes had been removed we placed a SLAP hammer on the femoral component. I held the base of the slaphammer on the femur and my assistant operated the slaphammer, and we noted no loosening at all.  1/4 inch osteotome was used to test the tibial implant which was also not loose.  And finally a 1 inch wide osteotome was placed at the interface between the patella and the implant and again no loosening was noted.  We also completed our synovectomy at this time.  The wound was thoroughly irrigated out with normal saline solution a trans-Amick acid soaked sponge was then used to wipe down all the tissues which were then injected with Exparel solution for local anesthesia.The parapatellar arthrotomy was closed with  running #1 Vicryl suture, By myself. The subcutaneous tissue was closed with0 and 2-0 undyed Vicryl suture, and the skin with running 3-0 SQ vicryl, by my physician assistant Dannielle BurnEric  Phillips PA-C. A dressing of Xeroform, 4 x 4, dressing sponges, Webril, and Ace wrap applied. The patient awakened, and taken to recovery room without difficulty.   Nestor LewandowskyFrank J Hamsini Verrilli 07/30/2017, 9:04 AM

## 2017-07-31 ENCOUNTER — Encounter (HOSPITAL_COMMUNITY): Payer: Self-pay | Admitting: Orthopedic Surgery

## 2017-07-31 LAB — BASIC METABOLIC PANEL
Anion gap: 9 (ref 5–15)
BUN: 14 mg/dL (ref 6–20)
CO2: 23 mmol/L (ref 22–32)
Calcium: 8 mg/dL — ABNORMAL LOW (ref 8.9–10.3)
Chloride: 95 mmol/L — ABNORMAL LOW (ref 101–111)
Creatinine, Ser: 0.88 mg/dL (ref 0.61–1.24)
GFR calc Af Amer: >60 mL/min (ref >60)
GFR calc non Af Amer: >60 mL/min (ref >60)
Glucose, Bld: 182 mg/dL — ABNORMAL HIGH (ref 65–99)
Potassium: 3.8 mmol/L (ref 3.5–5.1)
Sodium: 127 mmol/L — ABNORMAL LOW (ref 135–145)

## 2017-07-31 LAB — CBC
HCT: 38.5 % — ABNORMAL LOW (ref 39.0–52.0)
Hemoglobin: 13 g/dL (ref 13.0–17.0)
MCH: 31.6 pg (ref 26.0–34.0)
MCHC: 33.8 g/dL (ref 30.0–36.0)
MCV: 93.4 fL (ref 78.0–100.0)
Platelets: 159 10*3/uL (ref 150–400)
RBC: 4.12 MIL/uL — ABNORMAL LOW (ref 4.22–5.81)
RDW: 13 % (ref 11.5–15.5)
WBC: 11.6 10*3/uL — ABNORMAL HIGH (ref 4.0–10.5)

## 2017-07-31 NOTE — Progress Notes (Signed)
PATIENT ID: Keith Vance  MRN: [id number]  DOB/AGE:  55-Oct-1962 / 55 y.o.  1 Day Post-Op Procedure(s) (LRB): LEFT TOTAL KNEE PARTIAL REVISION AND SYNOVECTOMY (Left)    PROGRESS NOTE Subjective: Patient is alert, oriented, no Nausea, no Vomiting, yes passing gas. Taking PO well. Denies SOB, Chest or Calf Pain. Using Incentive Spirometer, PAS in place. Ambulate WBAT, Patient reports pain as 1/10,Patient is able to do straight leg raise without difficulty.  He is completely soaked through the dressing twice since surgery.  It is dark blood.  It was last changed by Dannielle BurnEric Phillips yesterday afternoon and is soaked through again, not into the Ace wrap. .    Objective: Vital signs in last 24 hours: Vitals:   07/30/17 1645 07/30/17 2130 07/31/17 0044 07/31/17 0529  BP: 103/71 109/69 123/79 115/66  Pulse: 91 75 68 67  Resp:  16 16 16   Temp: 98 F (36.7 C) 97.6 F (36.4 C) 98.1 F (36.7 C) 98.2 F (36.8 C)  TempSrc: Oral Oral Axillary Oral  SpO2: 93% 94% 96% 95%  Weight:      Height:          Intake/Output from previous day: I/O last 3 completed shifts: In: 2040 [P.O.:240; I.V.:1725; Other:75] Out: 2025 [Urine:1725; Blood:300]   Intake/Output this shift: No intake/output data recorded.   LABORATORY DATA: Recent Labs    07/31/17 0556  WBC 11.6*  HGB 13.0  HCT 38.5*  PLT 159  NA 127*  K 3.8  CL 95*  CO2 23  BUN 14  CREATININE 0.88  GLUCOSE 182*  CALCIUM 8.0*    Examination: Neurologically intact ABD soft Neurovascular intact Sensation intact distally Intact pulses distally Dorsiflexion/Plantar flexion intact Incision: dressing has soaked through twice since surgery, new dressing applied this morning.  Minimal active bleeding dark blood only little if any effusion in the knee. No cellulitis present Compartment soft}  Assessment:   1 Day Post-Op Procedure(s) (LRB): LEFT TOTAL KNEE PARTIAL REVISION AND SYNOVECTOMY (Left) ADDITIONAL DIAGNOSIS: Expected Acute Blood  Loss Anemia, Hypertension  Plan: PT/OT WBAT, AROM and PROM  DVT Prophylaxis:  SCDx72hrs, We will hold anticoagulants in light of bleeding. DISCHARGE PLAN: Home,Probably tomorrow.  We will monitor wound 1 more day DISCHARGE NEEDS: HHPT, Walker and 3-in-1 comode seat     Nestor LewandowskyFrank J Gildo Crisco 07/31/2017, 8:17 AM

## 2017-07-31 NOTE — Progress Notes (Addendum)
Physical Therapy Treatment and Discharge Patient Details Name: Keith Vance MRN: [id number] DOB: October 07, 1960 Today's Date: 07/31/2017    History of Present Illness 56 y.o. male s/p LEFT TOTAL KNEE PARTIAL REVISION AND SYNOVECTOMY (Left Knee) 12/3. PMH includes: HTN, chest pain, thoracic aortic aneurysm,     PT Comments    PM session focused on progressing activity tolerance. Patient is ambulating far distances with great mechanics and stable vitals at this time. Pt and family educated on safety considerations for home and have no further questions or concerns at this time. Pt has met all functional goals and will benefit from skilled home health PT when medically cleared for d/c.      Follow Up Recommendations  Home health PT;Supervision - Intermittent;DC plan and follow up therapy as arranged by surgeon     Equipment Recommendations  None recommended by PT    Recommendations for Other Services       Precautions / Restrictions Precautions Precautions: Fall;Knee Precaution Booklet Issued: Yes (comment) Precaution Comments: reviewed no pillow under knee, knee extension, supine therex Restrictions Weight Bearing Restrictions: Yes LLE Weight Bearing: Weight bearing as tolerated    Mobility  Bed Mobility Overal bed mobility: Modified Independent                Transfers Overall transfer level: Modified independent                  Ambulation/Gait Ambulation/Gait assistance: Modified independent (Device/Increase time);Supervision Ambulation Distance (Feet): 700 Feet Assistive device: Rolling walker (2 wheeled) Gait Pattern/deviations: Step-through pattern Gait velocity: normal   General Gait Details: VSS throughout long distance ambulation    Stairs            Wheelchair Mobility    Modified Rankin (Stroke Patients Only)       Balance Overall balance assessment: Needs assistance Sitting-balance support: Feet unsupported;No upper extremity  supported Sitting balance-Leahy Scale: Normal     Standing balance support: During functional activity;Single extremity supported;Bilateral upper extremity supported Standing balance-Leahy Scale: Good                              Cognition Arousal/Alertness: Awake/alert Behavior During Therapy: WFL for tasks assessed/performed Overall Cognitive Status: Within Functional Limits for tasks assessed                                        Exercises      General Comments        Pertinent Vitals/Pain Pain Assessment: Faces Faces Pain Scale: Hurts a little bit Pain Location: L KNEE Pain Descriptors / Indicators: Aching;Grimacing;Guarding Pain Intervention(s): Limited activity within patient's tolerance;Monitored during session;Premedicated before session    Home Living                      Prior Function            PT Goals (current goals can now be found in the care plan section) Acute Rehab PT Goals Patient Stated Goal: to return home with HHPT PT Goal Formulation: With patient Time For Goal Achievement: 08/06/17 Potential to Achieve Goals: Good Progress towards PT goals: Goals met/education completed, patient discharged from PT    Frequency           PT Plan Current plan remains appropriate    Co-evaluation  AM-PAC PT "6 Clicks" Daily Activity  Outcome Measure  Difficulty turning over in bed (including adjusting bedclothes, sheets and blankets)?: A Little Difficulty moving from lying on back to sitting on the side of the bed? : A Little Difficulty sitting down on and standing up from a chair with arms (e.g., wheelchair, bedside commode, etc,.)?: A Little Help needed moving to and from a bed to chair (including a wheelchair)?: A Little Help needed walking in hospital room?: A Little Help needed climbing 3-5 steps with a railing? : A Little 6 Click Score: 18    End of Session Equipment Utilized During  Treatment: Gait belt Activity Tolerance: Patient tolerated treatment well Patient left: in bed;with call bell/phone within reach;with nursing/sitter in room Nurse Communication: Mobility status PT Visit Diagnosis: Unsteadiness on feet (R26.81);Other abnormalities of gait and mobility (R26.89);Muscle weakness (generalized) (M62.81);Pain Pain - Right/Left: Left Pain - part of body: Knee     Time: 0298-4730 PT Time Calculation (min) (ACUTE ONLY): 22 min  Charges:  $Gait Training: 8-22 mins                    G Codes:     Reinaldo Berber, PT, DPT Acute Rehab Services Pager: [phone]    Reinaldo Berber 07/31/2017, 2:55 PM

## 2017-07-31 NOTE — Progress Notes (Signed)
Pt on CPAP for the night.  Tolerating well.  Pt able to place and remove mask- uses CPAP at home.  Will cont to monitor progress.

## 2017-07-31 NOTE — Progress Notes (Signed)
Physical Therapy Treatment Patient Details Name: Keith Vance MRN: [id number] DOB: 1961-02-21 Today's Date: 07/31/2017    History of Present Illness 56 y.o. male s/p LEFT TOTAL KNEE PARTIAL REVISION AND SYNOVECTOMY (Left Knee) 12/3. PMH includes: HTN, chest pain, thoracic aortic aneurysm,     PT Comments    Session focused on improving activity tolerance. Patient progressing towards goals well at this time, ambulating further distances with better mechanics. Patients VSS throughout session. Plan to visit patient this afternoon and ensure consistent progress before safe return home with HHPT once medically cleared.    Follow Up Recommendations  Home health PT;Supervision - Intermittent;DC plan and follow up therapy as arranged by surgeon     Equipment Recommendations  None recommended by PT    Recommendations for Other Services       Precautions / Restrictions Precautions Precautions: Fall;Knee Precaution Booklet Issued: Yes (comment) Precaution Comments: reviewed no pillow under knee, knee extension, supine therex Restrictions Weight Bearing Restrictions: Yes LLE Weight Bearing: Weight bearing as tolerated    Mobility  Bed Mobility Overal bed mobility: Modified Independent                Transfers Overall transfer level: Modified independent Equipment used: Rolling walker (2 wheeled) Transfers: Sit to/from Stand Sit to Stand: Modified independent (Device/Increase time)         General transfer comment: Patient mod I with transfers at this time. able to rise sit to stand without AD and good stability.   Ambulation/Gait Ambulation/Gait assistance: Supervision Ambulation Distance (Feet): 350 Feet Assistive device: Rolling walker (2 wheeled) Gait Pattern/deviations: Step-through pattern;Antalgic Gait velocity: normal   General Gait Details: Progressed to supervision during ambulation today. normal velocity with good heel strike, equal length steps.     Stairs            Wheelchair Mobility    Modified Rankin (Stroke Patients Only)       Balance Overall balance assessment: Needs assistance Sitting-balance support: Feet unsupported;No upper extremity supported Sitting balance-Leahy Scale: Normal     Standing balance support: During functional activity;Single extremity supported;Bilateral upper extremity supported Standing balance-Leahy Scale: Good Standing balance comment: safer with UE support during functional tasks.                            Cognition Arousal/Alertness: Awake/alert Behavior During Therapy: WFL for tasks assessed/performed Overall Cognitive Status: Within Functional Limits for tasks assessed                                        Exercises      General Comments General comments (skin integrity, edema, etc.): SpO2= >90% throughout ambulation  Limited session and stress to knee joint due to bleeding incision site yesterday.       Pertinent Vitals/Pain Pain Assessment: Faces Faces Pain Scale: Hurts a little bit Pain Location: L KNEE Pain Descriptors / Indicators: Aching;Grimacing;Guarding Pain Intervention(s): Limited activity within patient's tolerance;Monitored during session    Home Living                      Prior Function            PT Goals (current goals can now be found in the care plan section) Acute Rehab PT Goals Patient Stated Goal: to return home with HHPT PT Goal Formulation: With patient  Time For Goal Achievement: 08/06/17 Potential to Achieve Goals: Good Progress towards PT goals: Progressing toward goals    Frequency    7X/week      PT Plan Current plan remains appropriate    Co-evaluation              AM-PAC PT "6 Clicks" Daily Activity  Outcome Measure  Difficulty turning over in bed (including adjusting bedclothes, sheets and blankets)?: A Little Difficulty moving from lying on back to sitting on the side  of the bed? : A Little Difficulty sitting down on and standing up from a chair with arms (e.g., wheelchair, bedside commode, etc,.)?: A Little Help needed moving to and from a bed to chair (including a wheelchair)?: A Little Help needed walking in hospital room?: A Little Help needed climbing 3-5 steps with a railing? : A Little 6 Click Score: 18    End of Session Equipment Utilized During Treatment: Gait belt Activity Tolerance: Patient tolerated treatment well Patient left: in bed;with call bell/phone within reach;with nursing/sitter in room Nurse Communication: Mobility status PT Visit Diagnosis: Unsteadiness on feet (R26.81);Other abnormalities of gait and mobility (R26.89);Muscle weakness (generalized) (M62.81);Pain Pain - Right/Left: Left Pain - part of body: Knee     Time: [id number] PT Time Calculation (min) (ACUTE ONLY): 16 min  Charges:  $Gait Training: 8-22 mins                    G Codes:       Keith Vance, PT, DPT Acute Rehab Services Pager: [phone]    Keith GrandchildSean  Kwasi Joung 07/31/2017, 9:31 AM

## 2017-07-31 NOTE — Progress Notes (Signed)
OT Cancellation Note  Patient Details Name: Keith Vance MRN: [id number] DOB: 1960/09/21   Cancelled Treatment:    Reason Eval/Treat Not Completed: OT screened, no needs identified, will sign off OT spoke with patient regarding evaluation needs: Pt is found to be fully dressed, transferring and previous surgery. Pt does not require OT evaluation at this time. Pt was educated on bathing wound.  Harolyn RutherfordJones, Sumner Boesch B  [phone] 07/31/2017, 3:03 PM

## 2017-08-01 LAB — CBC
HCT: 39.8 % (ref 39.0–52.0)
Hemoglobin: 13.4 g/dL (ref 13.0–17.0)
MCH: 31.8 pg (ref 26.0–34.0)
MCHC: 33.7 g/dL (ref 30.0–36.0)
MCV: 94.5 fL (ref 78.0–100.0)
Platelets: 167 10*3/uL (ref 150–400)
RBC: 4.21 MIL/uL — ABNORMAL LOW (ref 4.22–5.81)
RDW: 13.3 % (ref 11.5–15.5)
WBC: 10.9 10*3/uL — ABNORMAL HIGH (ref 4.0–10.5)

## 2017-08-01 NOTE — Progress Notes (Signed)
PATIENT ID: Keith Vance  MRN: [id number]  DOB/AGE:  04-Oct-1960 / 56 y.o.  2 Days Post-Op Procedure(s) (LRB): LEFT TOTAL KNEE PARTIAL REVISION AND SYNOVECTOMY (Left)    PROGRESS NOTE Subjective: Patient is alert, oriented, no Nausea, no Vomiting, yes passing gas. Taking PO well. Denies SOB, Chest or Calf Pain. Using Incentive Spirometer, PAS in place. Ambulate WBAT with pt walking 700 ft with therapy, Patient reports pain as mild .  Pt's bandage was once again changed.  Mild drainage about dime size on 4x4.  Applied aquacell and ace bandage..  Objective: Vital signs in last 24 hours: Vitals:   07/31/17 1300 07/31/17 2120 08/01/17 0450 08/01/17 0815  BP: 127/67 132/80 116/76 122/72  Pulse: 73 75 70 72  Resp: 16 18 18 18   Temp: 98 F (36.7 C) 98.1 F (36.7 C) 98 F (36.7 C) 98.2 F (36.8 C)  TempSrc: Oral Oral Oral Oral  SpO2: 95% 96% 98% 96%  Weight:      Height:          Intake/Output from previous day: I/O last 3 completed shifts: In: 880 [P.O.:880] Out: 1500 [Urine:1500]   Intake/Output this shift: No intake/output data recorded.   LABORATORY DATA: Recent Labs    07/31/17 0556 08/01/17 0536  WBC 11.6* 10.9*  HGB 13.0 13.4  HCT 38.5* 39.8  PLT 159 167  NA 127*  --   K 3.8  --   CL 95*  --   CO2 23  --   BUN 14  --   CREATININE 0.88  --   GLUCOSE 182*  --   CALCIUM 8.0*  --     Examination: Neurologically intact Neurovascular intact Sensation intact distally Intact pulses distally Dorsiflexion/Plantar flexion intact Incision: scant drainage No cellulitis present Compartment soft}  Assessment:   2 Days Post-Op Procedure(s) (LRB): LEFT TOTAL KNEE PARTIAL REVISION AND SYNOVECTOMY (Left) ADDITIONAL DIAGNOSIS: Expected Acute Blood Loss Anemia, Sleep Apnea, HTN  Plan: PT/OT WBAT, AROM and PROM  DVT Prophylaxis:  SCDx72hrs, hold anticoagulants in light of bleeding DISCHARGE PLAN: Home DISCHARGE NEEDS: HHPT, Walker and 3-in-1 comode seat      Keith Vance R 08/01/2017, 8:21 AM

## 2017-08-01 NOTE — Progress Notes (Signed)
Patient has met discharge criteria at this time. Discharge instructions discussed with patient and family.  Patient and family verbalized understanding of instructions.  Paper prescriptions given to patient for following medications: aspirin, oxycodone-acetaminophen, zanaflex .  Patient instructed to call office to schedule follow up appointment with surgeon. Denies any further questions or concerns.  Patient discharged home with family via wheelchair.  Instructed to call office with any further questions or concerns. Aquacell, gauze, and ace wraps given to patient.

## 2017-08-01 NOTE — Discharge Summary (Signed)
Patient ID: Keith Vance MRN: [id number] DOB/AGE: [birth year] 56 y.o.  Admit date: 07/30/2017 Discharge date: 08/01/2017  Admission Diagnoses:  Principal Problem:   Failed total knee arthroplasty, initial encounter [facility](HCC) Active Problems:   Primary osteoarthritis of left knee   Discharge Diagnoses:  Same  Past Medical History:  Diagnosis Date  . Arthritis   . Ascending aortic aneurysm (HCC)    a. 10/2015 CTA chest: 4.4cm aneurysmal dil of Asc Ao-->rec f/u in 1 yr.  [name]. GERD (gastroesophageal reflux disease)    hx  . History of kidney stones   . Hyperlipidemia   . Hypertension   . Pneumonia    hx  . SBO (small bowel obstruction) (HCC)    a. 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  [name]. Sleep apnea    cpap    Surgeries: Procedure(s): LEFT TOTAL KNEE PARTIAL REVISION AND SYNOVECTOMY on 07/30/2017   Consultants:   Discharged Condition: Improved  Hospital Course: Keith Vance is an 56 y.o. male who was admitted 07/30/2017 for operative treatment ofFailed total knee arthroplasty, initial encounter (HCC). Patient has severe unremitting pain that affects sleep, daily activities, and work/hobbies. After pre-op clearance the patient was taken to the operating room on 07/30/2017 and underwent  Procedure(s): LEFT TOTAL KNEE PARTIAL REVISION AND SYNOVECTOMY.    Patient was given perioperative antibiotics:  Anti-infectives (From admission, onward)   Start     Dose/Rate Route Frequency Ordered Stop   07/30/17 0814  cefUROXime (ZINACEF) injection  Status:  Discontinued       As needed 07/30/17 0815 07/30/17 0931   07/30/17 0700  ceFAZolin (ANCEF) 3 g in dextrose 5 % 50 mL IVPB     3 g 130 mL/hr over 30 Minutes Intravenous To ShortStay Surgical 07/27/17 [id number] 07/30/17 0802       Patient was given sequential compression devices, early ambulation, and chemoprophylaxis to prevent DVT.  Patient benefited maximally from hospital stay and there were no complications.    Recent vital signs:  Patient Vitals  for the past 24 hrs:  BP Temp Temp src Pulse Resp SpO2  08/01/17 0815 122/72 98.2 F (36.8 C) Oral 72 18 96 %  08/01/17 0450 116/76 98 F (36.7 C) Oral 70 18 98 %  07/31/17 2120 132/80 98.1 F (36.7 C) Oral 75 18 96 %  07/31/17 1300 127/67 98 F (36.7 C) Oral 73 16 95 %     Recent laboratory studies:  Recent Labs    07/31/17 0556 08/01/17 0536  WBC 11.6* 10.9*  HGB 13.0 13.4  HCT 38.5* 39.8  PLT 159 167  NA 127*  --   K 3.8  --   CL 95*  --   CO2 23  --   BUN 14  --   CREATININE 0.88  --   GLUCOSE 182*  --   CALCIUM 8.0*  --      Discharge Medications:   Allergies as of 08/01/2017   No Known Allergies     Medication List    STOP taking these medications   acetaminophen 650 MG CR tablet Commonly known as:  TYLENOL   meloxicam 15 MG tablet Commonly known as:  MOBIC   traMADol 50 MG tablet Commonly known as:  ULTRAM     TAKE these medications   amLODipine 10 MG tablet Commonly known as:  NORVASC Take 1 tablet (10 mg total) by mouth daily.   aspirin EC 325 MG tablet Take 1 tablet (325 mg total) by mouth 2 (two) times daily.  What changed:    medication strength  how much to take  when to take this   BIOFREEZE EX Apply 1 application topically as needed (for pain).   Fish Oil 1200 MG Caps Take 1,200 mg by mouth daily.   lisinopril-hydrochlorothiazide 20-12.5 MG tablet Commonly known as:  PRINZIDE,ZESTORETIC Take 1 tablet by mouth 2 (two) times daily.   multivitamin with minerals tablet Take 1 tablet by mouth daily.   oxyCODONE-acetaminophen 5-325 MG tablet Commonly known as:  ROXICET Take 1 tablet by mouth every 4 (four) hours as needed.   polyethylene glycol packet Commonly known as:  MIRALAX / GLYCOLAX Take 17 g by mouth 2 (two) times daily. What changed:  when to take this   tiZANidine 2 MG tablet Commonly known as:  ZANAFLEX Take 1 tablet (2 mg total) by mouth every 6 (six) hours as needed for muscle spasms.             Durable Medical Equipment  (From admission, onward)        Start     Ordered   07/30/17 1040  DME Walker rolling  Once    Question:  Patient needs a walker to treat with the following condition  Answer:  Status post total left knee replacement   07/30/17 1039   07/30/17 1040  DME 3 n 1  Once     07/30/17 1039   07/30/17 1040  DME Bedside commode  Once    Question:  Patient needs a bedside commode to treat with the following condition  Answer:  Status post total left knee replacement   07/30/17 1039      Diagnostic Studies: No results found.  Disposition: 06-Home-Health Care Svc  Discharge Instructions    Call MD / Call 911   Complete by:  As directed    If you experience chest pain or shortness of breath, CALL 911 and be transported to the hospital emergency room.  If you develope a fever above 101 F, pus (white drainage) or increased drainage or redness at the wound, or calf pain, call your surgeon's office.   Constipation Prevention   Complete by:  As directed    Drink plenty of fluids.  Prune juice may be helpful.  You may use a stool softener, such as Colace (over the counter) 100 mg twice a day.  Use MiraLax (over the counter) for constipation as needed.   Diet - low sodium heart healthy   Complete by:  As directed    Driving restrictions   Complete by:  As directed    No driving for 2 weeks   Increase activity slowly as tolerated   Complete by:  As directed    Patient may shower   Complete by:  As directed    You may shower without a dressing once there is no drainage.  Do not wash over the wound.  If drainage remains, cover wound with plastic wrap and then shower.      Follow-up Information    Gean Birchwoodowan, Frank, MD In 2 weeks.   Specialty:  Orthopedic Surgery Contact information: [address] [place] [address] [phone]            Signed: Vear ClockHILLIPS, Shakelia Scrivner R 08/01/2017, 8:25 AM

## 2017-08-04 LAB — AEROBIC/ANAEROBIC CULTURE W GRAM STAIN (SURGICAL/DEEP WOUND): Culture: NO GROWTH

## 2017-08-10 ENCOUNTER — Ambulatory Visit
Admission: RE | Admit: 2017-08-10 | Discharge: 2017-08-10 | Disposition: A | Discharge status: Home / Self Care | Payer: Worker's Compensation | Source: Ambulatory Visit | Attending: Orthopedic Surgery | Admitting: Orthopedic Surgery

## 2017-08-10 ENCOUNTER — Other Ambulatory Visit: Payer: Self-pay | Admitting: Orthopedic Surgery

## 2017-08-10 DIAGNOSIS — M25562 Pain in left knee: Secondary | ICD-10-CM | POA: Diagnosis not present

## 2017-09-01 ENCOUNTER — Other Ambulatory Visit: Payer: Self-pay | Admitting: Cardiovascular Disease

## 2017-09-03 IMAGING — CT CT ANGIO CHEST
2 of 7 series · 17 of 46 positions shown · IV contrast (isovue)
Comparison: CTA chest 11/11/2015. CT abdomen and pelvis 11/12/2015,
08/25/2015, 09/06/2010.

CLINICAL DATA: Annual follow-up of ascending thoracic aortic
aneurysm. Current history of hypertension and hyperlipidemia. Former
smoker who quit in 5165.

EXAM:
CT ANGIOGRAPHY CHEST WITH CONTRAST
TECHNIQUE: Multidetector CT imaging of the chest was performed using the
standard protocol during bolus administration of intravenous
contrast. Multiplanar CT image reconstructions and MIPs were
obtained to evaluate the vascular anatomy.
CONTRAST:  100 mL Isovue 370 IV.

[Series 4: aorta 3.0 i31f 2 · axial · 0.87mm/px · z∈[-376,-40]mm · 14 of 122 slices shown]
[im 5/122  lung]
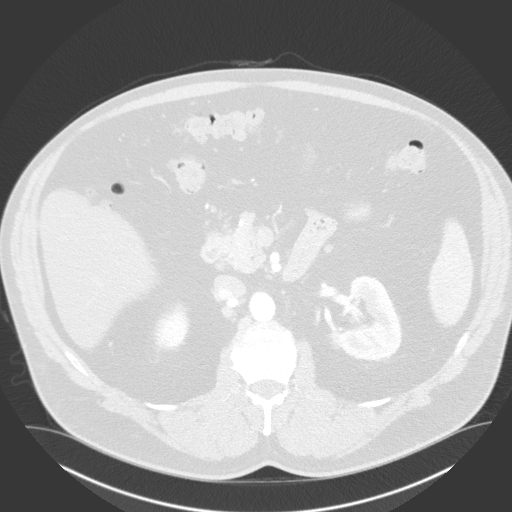
[im 13/122  soft-tissue]
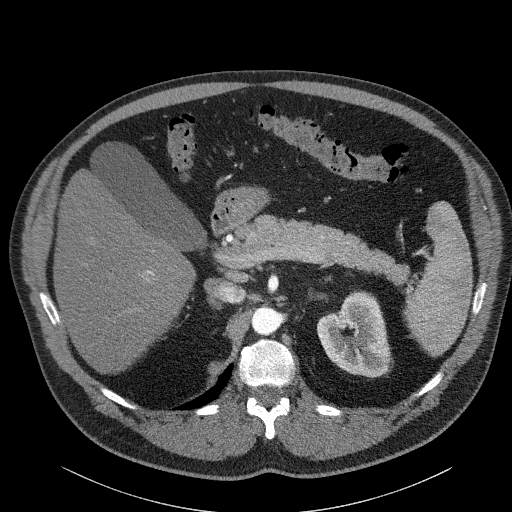
[im 22/122  lung]
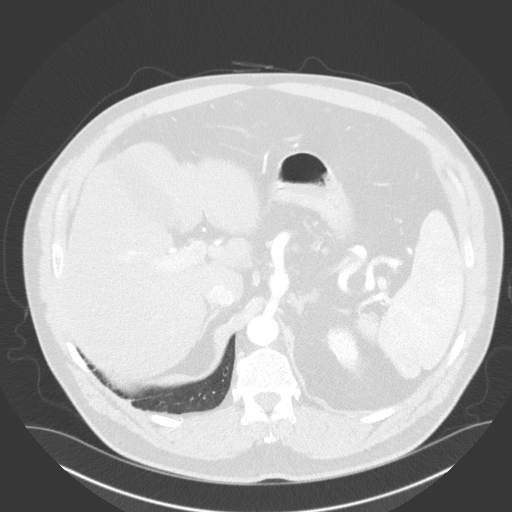
[im 31/122  soft-tissue]
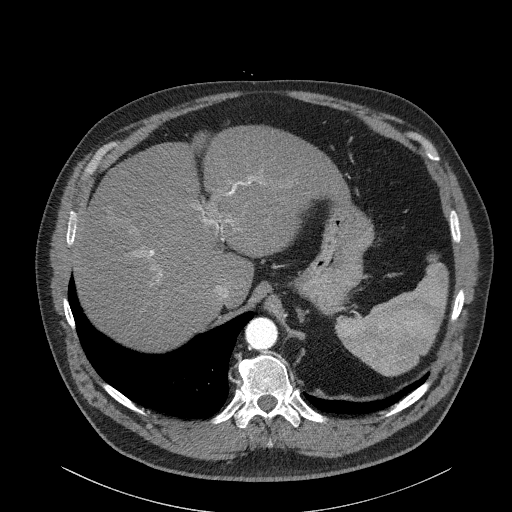
[im 39/122  lung]
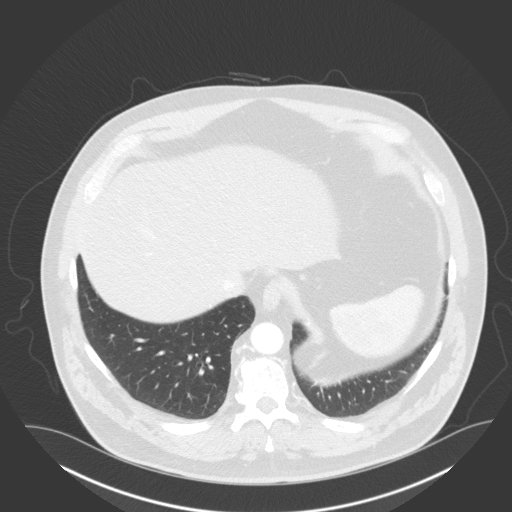
[im 48/122  soft-tissue]
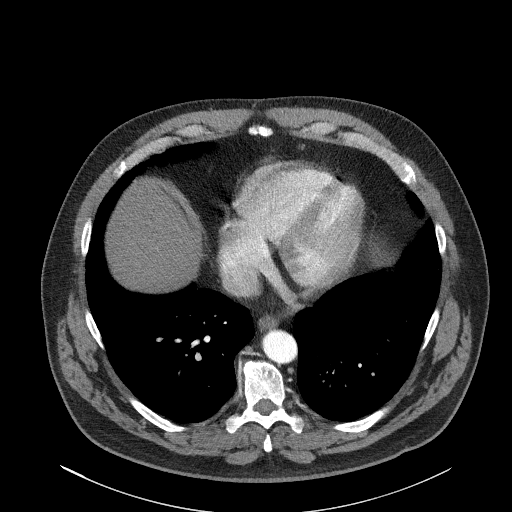
[im 57/122  lung]
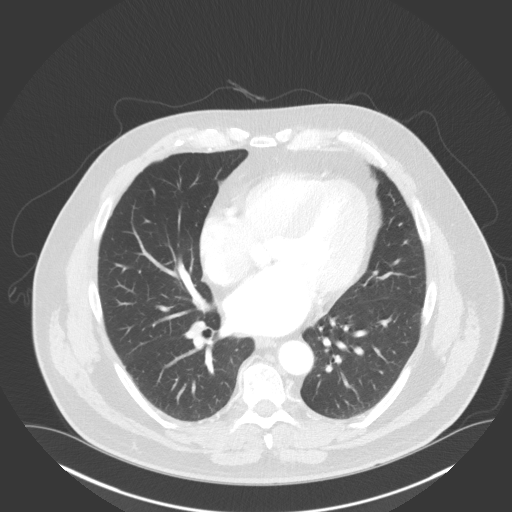
[im 65/122  soft-tissue]
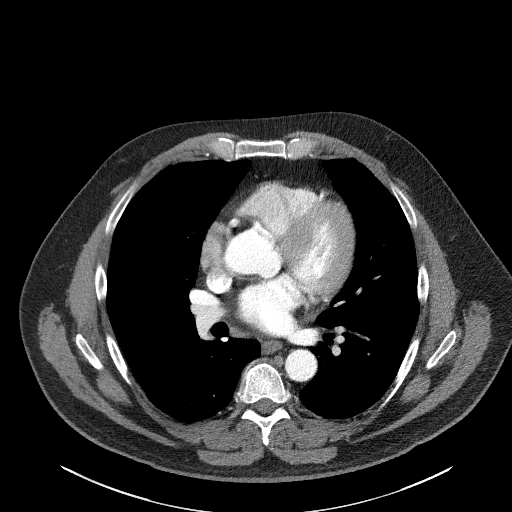
[im 74/122  lung]
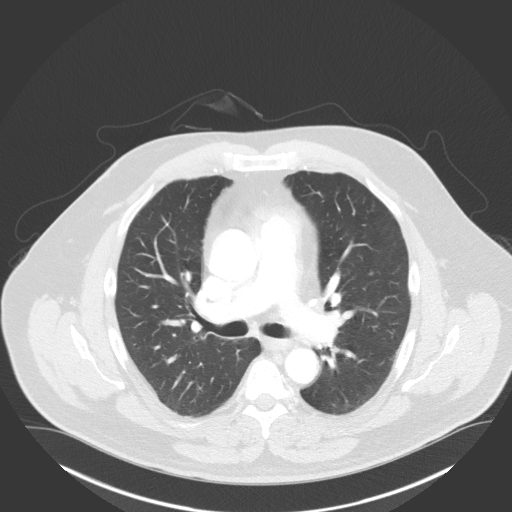
[im 83/122  soft-tissue]
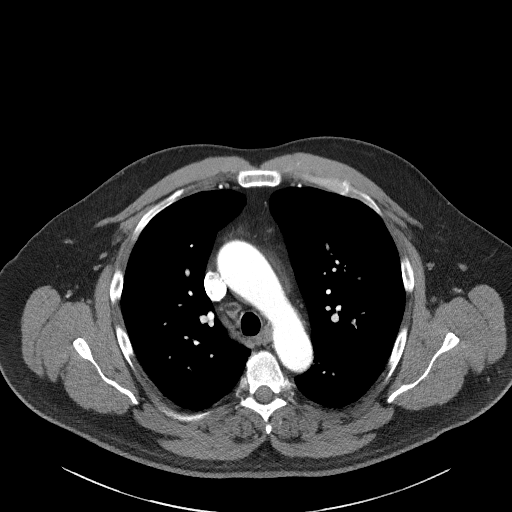
[im 91/122  lung]
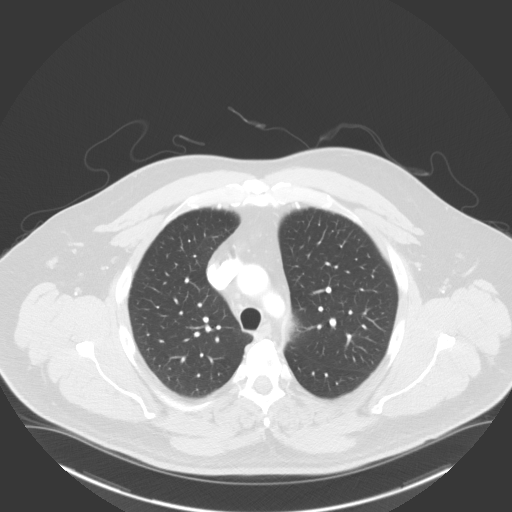
[im 100/122  soft-tissue]
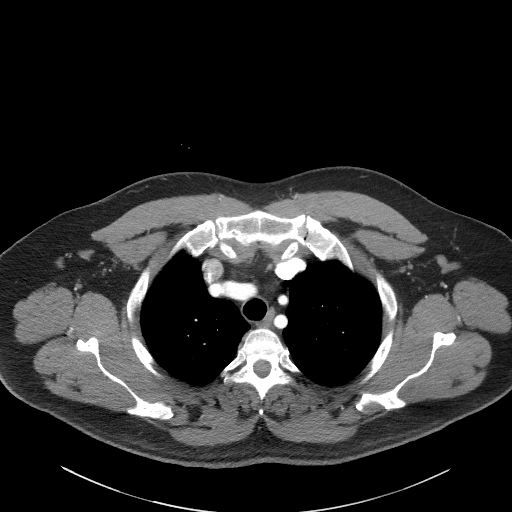
[im 109/122  lung]
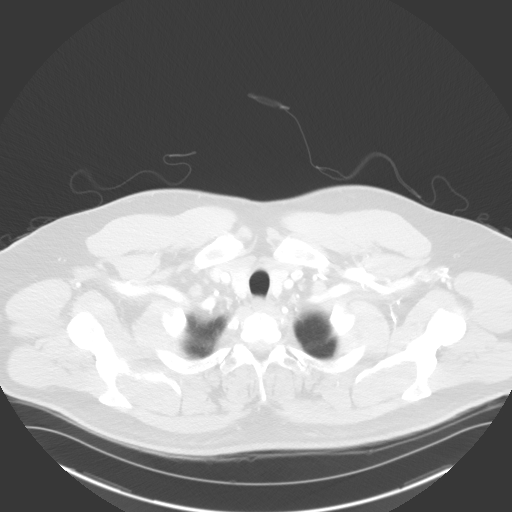
[im 117/122  soft-tissue]
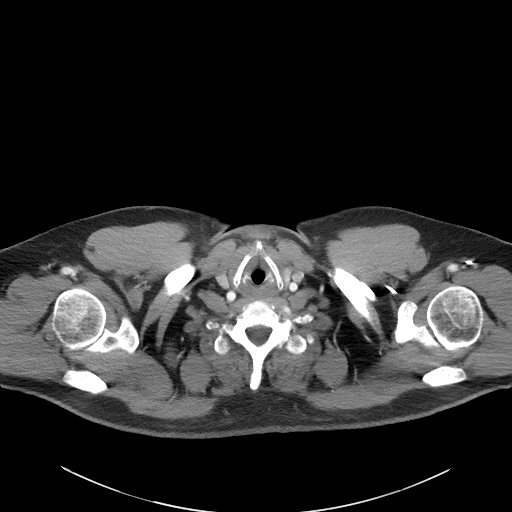

[Series 7: coronals · coronal · 0.72mm/px · 3 of 146 slices shown]
[im 37/146  soft-tissue]
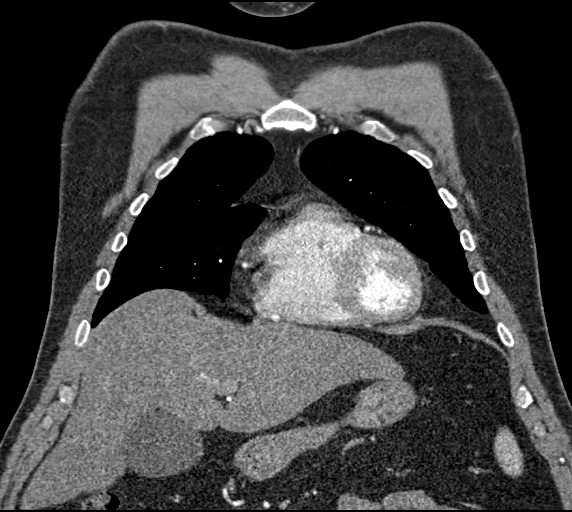
[im 73/146  soft-tissue]
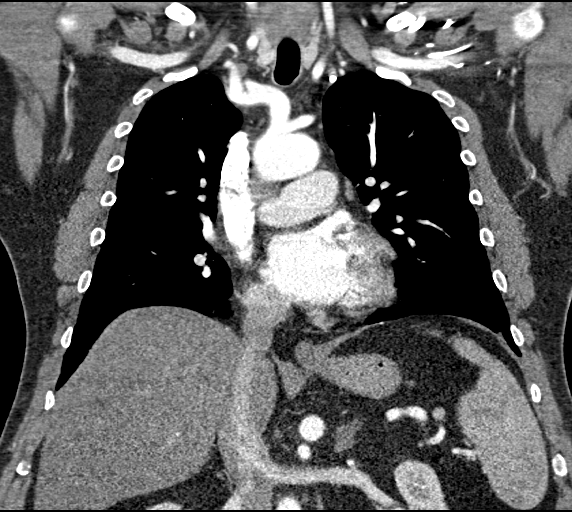
[im 109/146  soft-tissue]
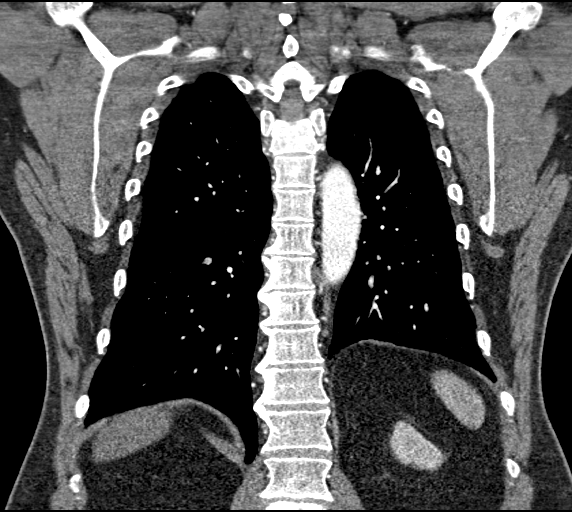

[17 of 46 positions shown; findings below may reference images not displayed]

FINDINGS: Cardiovascular: Ectatic ascending thoracic aortic with maximum
diameter 4.4 cm (measured at the level of the main pulmonary trunk
as on the prior examination), unchanged since the prior CT. The
remainder of the thoracic aorta is normal in caliber. Bovine aortic
arch anatomy (left common carotid artery arises from the innominate
artery). Proximal great vessels widely patent. Mild atherosclerosis
involving the aortic arch and the origin of the left subclavian
artery. Central pulmonary arteries patent.

Heart size upper normal, unchanged. Mild LAD and right coronary
atherosclerosis. No pericardial effusion.

Aneurysm involving the distal celiac trunk at its bifurcation
measuring approximately 2.1 cm diameter, stable dating back to a CT
abdomen pelvis from August 2010.

Mediastinum/Nodes: No pathologically enlarged mediastinal, hilar or
axillary lymph nodes. No mediastinal masses. Normal-appearing
esophagus. Thyroid gland normal in appearance.

Lungs/Pleura: Minimal scarring in the lingula and in the lower
lobes, unchanged. Pulmonary parenchyma otherwise clear without
localized airspace consolidation, interstitial disease, or
parenchymal nodules or masses. Central airways patent without
significant bronchial wall thickening. No pleural effusions. No
pleural plaques or masses.

Upper Abdomen: Diffuse steatosis involving the visualized liver.
Mild splenomegaly, the spleen measuring approximately 13.8 x 5.8 x
14.9 cm, yielding a volume of approximately 596 cc, not
significantly changed in size since February 2015. Apart from the
celiac aneurysm described above, remaining visualized upper abdomen
unremarkable.

Musculoskeletal: Degenerative changes involving the mid and lower
thoracic spine. Degenerative disc disease and spondylosis at L1-2.
No acute osseous abnormalities.

Review of the MIP images confirms the above findings.
IMPRESSION: 1. Stable ectasia of the ascending thoracic aorta, maximum diameter
4.4 cm. The remainder of the thoracic and upper abdominal aorta are
of normal caliber.
2. Aneurysm involving the distal celiac trunk at its bifurcation
measuring 2.1 cm, unchanged dating back to August 2010.
3.  No acute cardiopulmonary disease.
4. Mild LAD and right coronary artery atherosclerosis. Mild thoracic
aortic atherosclerosis.
5. Diffuse hepatic steatosis.
6. Stable mild splenomegaly.
Recommend annual imaging followup by CTA or MRA. This recommendation
follows 0909 ACCF/AHA/AATS/ACR/ASA/SCA/SCHWAGGY/BLONDINACKA/SAMANALI/CELEBAN Guidelines
for the Diagnosis and Management of Patients with Thoracic Aortic
Disease. Circulation. 0909; 121: [id number]

Aortic aneurysm NOS (SGEM4-ZWU.D).

Aortic Atherosclerosis (SGEM4-J7F.F).

## 2017-10-17 ENCOUNTER — Other Ambulatory Visit: Payer: Self-pay | Admitting: Cardiovascular Disease

## 2017-10-17 NOTE — Telephone Encounter (Signed)
REFILL 

## 2018-02-27 ENCOUNTER — Encounter: Payer: Self-pay | Admitting: Family Medicine

## 2018-02-27 ENCOUNTER — Ambulatory Visit: Payer: BLUE CROSS/BLUE SHIELD | Admitting: Family Medicine

## 2018-02-27 VITALS — BP 132/86 | HR 78 | Ht 71.0 in | Wt 280.7 lb

## 2018-02-27 DIAGNOSIS — I10 Essential (primary) hypertension: Secondary | ICD-10-CM

## 2018-02-27 DIAGNOSIS — K760 Fatty (change of) liver, not elsewhere classified: Secondary | ICD-10-CM | POA: Diagnosis not present

## 2018-02-27 DIAGNOSIS — Z8639 Personal history of other endocrine, nutritional and metabolic disease: Secondary | ICD-10-CM | POA: Diagnosis not present

## 2018-02-27 NOTE — Patient Instructions (Addendum)
Please check your blood pressure at home and bring it in next office visit.  Is important to mark down your blood pressure as well as pulse.     Please realize, EXERCISE IS MEDICINE!  -  American Heart Association [facility]( AHA) guidelines for exercise : If you are in good health, without any medical conditions, you should engage in 150 minutes of moderate intensity aerobic activity per week.  This means you should be huffing and puffing throughout your workout.   Engaging in regular exercise will improve brain function and memory, as well as improve mood, boost immune system and help with weight management.  As well as the other, more well-known effects of exercise such as decreasing blood sugar levels, decreasing blood pressure,  and decreasing bad cholesterol levels/ increasing good cholesterol levels.     -  The AHA strongly endorses consumption of a diet that contains a variety of foods from all the food categories with an emphasis on fruits and vegetables; fat-free and low-fat dairy products; cereal and grain products; legumes and nuts; and fish, poultry, and/or extra lean meats.    Excessive food intake, especially of foods high in saturated and trans fats, sugar, and salt, should be avoided.    Adequate water intake of roughly 1/2 of your weight in pounds, should equal the ounces of water per day you should drink.  So for instance, if you're 200 pounds, that would be 100 ounces of water per day.         Mediterranean Diet  Why follow it? Research shows. . Those who follow the Mediterranean diet have a reduced risk of heart disease  . The diet is associated with a reduced incidence of Parkinson's and Alzheimer's diseases . People following the diet may have longer life expectancies and lower rates of chronic diseases  . The Dietary Guidelines for Americans recommends the Mediterranean diet as an eating plan to promote health and prevent disease  What Is the Mediterranean Diet?  . Healthy  eating plan based on typical foods and recipes of Mediterranean-style cooking . The diet is primarily a plant based diet; these foods should make up a majority of meals   Starches - Plant based foods should make up a majority of meals - They are an important sources of vitamins, minerals, energy, antioxidants, and fiber - Choose whole grains, foods high in fiber and minimally processed items  - Typical grain sources include wheat, oats, barley, corn, brown rice, bulgar, farro, millet, polenta, couscous  - Various types of beans include chickpeas, lentils, fava beans, black beans, white beans   Fruits  Veggies - Large quantities of antioxidant rich fruits & veggies; 6 or more servings  - Vegetables can be eaten raw or lightly drizzled with oil and cooked  - Vegetables common to the traditional Mediterranean Diet include: artichokes, arugula, beets, broccoli, brussel sprouts, cabbage, carrots, celery, collard greens, cucumbers, eggplant, kale, leeks, lemons, lettuce, mushrooms, okra, onions, peas, peppers, potatoes, pumpkin, radishes, rutabaga, shallots, spinach, sweet potatoes, turnips, zucchini - Fruits common to the Mediterranean Diet include: apples, apricots, avocados, cherries, clementines, dates, figs, grapefruits, grapes, melons, nectarines, oranges, peaches, pears, pomegranates, strawberries, tangerines  Fats - Replace butter and margarine with healthy oils, such as olive oil, canola oil, and tahini  - Limit nuts to no more than a handful a day  - Nuts include walnuts, almonds, pecans, pistachios, pine nuts  - Limit or avoid candied, honey roasted or heavily salted nuts - Olives are central to  the Mediterranean diet - can be eaten whole or used in a variety of dishes   Meats Protein - Limiting red meat: no more than a few times a month - When eating red meat: choose lean cuts and keep the portion to the size of deck of cards - Eggs: approx. 0 to 4 times a week  - Fish and lean poultry: at  least 2 a week  - Healthy protein sources include, chicken, [place], lean beef, lamb - Increase intake of seafood such as tuna, salmon, trout, mackerel, shrimp, scallops - Avoid or limit high fat processed meats such as sausage and bacon  Dairy - Include moderate amounts of low fat dairy products  - Focus on healthy dairy such as fat free yogurt, skim milk, low or reduced fat cheese - Limit dairy products higher in fat such as whole or 2% milk, cheese, ice cream  Alcohol - Moderate amounts of red wine is ok  - No more than 5 oz daily for women (all ages) and men older than age 80  - No more than 10 oz of wine daily for men younger than 84  Other - Limit sweets and other desserts  - Use herbs and spices instead of salt to flavor foods  - Herbs and spices common to the traditional Mediterranean Diet include: basil, bay leaves, chives, cloves, cumin, fennel, garlic, lavender, marjoram, mint, oregano, parsley, pepper, rosemary, sage, savory, sumac, tarragon, thyme   It's not just a diet, it's a lifestyle:  . The Mediterranean diet includes lifestyle factors typical of those in the region  . Foods, drinks and meals are best eaten with others and savored . Daily physical activity is important for overall good health . This could be strenuous exercise like running and aerobics . This could also be more leisurely activities such as walking, housework, yard-work, or taking the stairs . Moderation is the key; a balanced and healthy diet accommodates most foods and drinks . Consider portion sizes and frequency of consumption of certain foods   Meal Ideas & Options:  . Breakfast:  o Whole wheat toast or whole wheat English muffins with peanut butter & hard boiled egg o Steel cut oats topped with apples & cinnamon and skim milk  o Fresh fruit: banana, strawberries, melon, berries, peaches  o Smoothies: strawberries, bananas, greek yogurt, peanut butter o Low fat greek yogurt with blueberries and  granola  o Egg white omelet with spinach and mushrooms o Breakfast couscous: whole wheat couscous, apricots, skim milk, cranberries  . Sandwiches:  o Hummus and grilled vegetables (peppers, zucchini, squash) on whole wheat bread   o Grilled chicken on whole wheat pita with lettuce, tomatoes, cucumbers or tzatziki  o Tuna salad on whole wheat bread: tuna salad made with greek yogurt, olives, red peppers, capers, green onions o Garlic rosemary lamb pita: lamb sauted with garlic, rosemary, salt & pepper; add lettuce, cucumber, greek yogurt to pita - flavor with lemon juice and black pepper  . Seafood:  o Mediterranean grilled salmon, seasoned with garlic, basil, parsley, lemon juice and black pepper o Shrimp, lemon, and spinach whole-grain pasta salad made with low fat greek yogurt  o Seared scallops with lemon orzo  o Seared tuna steaks seasoned salt, pepper, coriander topped with tomato mixture of olives, tomatoes, olive oil, minced garlic, parsley, green onions and cappers  . Meats:  o Herbed greek chicken salad with kalamata olives, cucumber, feta  o Red bell peppers stuffed with spinach, bulgur, lean  ground beef (or lentils) & topped with feta   o Kebabs: skewers of chicken, tomatoes, onions, zucchini, squash  o [place] burgers: made with red onions, mint, dill, lemon juice, feta cheese topped with roasted red peppers . Vegetarian o Cucumber salad: cucumbers, artichoke hearts, celery, red onion, feta cheese, tossed in olive oil & lemon juice  o Hummus and whole grain pita points with a greek salad (lettuce, tomato, feta, olives, cucumbers, red onion) o Lentil soup with celery, carrots made with vegetable broth, garlic, salt and pepper  o Tabouli salad: parsley, bulgur, mint, scallions, cucumbers, tomato, radishes, lemon juice, olive oil, salt and pepper.

## 2018-02-27 NOTE — Progress Notes (Signed)
New patient office visit note:  Impression and Recommendations:    1. Essential hypertension   2. History of hyperlipidemia, mixed   3. Morbid obesity ([place])   4. h/o elevated LFT's- dx of Fatty liver disease, nonalcoholic     1. Hypertension - No changes to medication made today.  - Lifestyle changes such as dash diet and engaging in a regular exercise program discussed with patient.  Educational handouts provided  - Ambulatory BP monitoring encouraged. Sit quietly for 15-20 minutes prior to taking BP and pulse.  Keep log and bring in next OV  2. BMI Counseling Explained to patient what BMI refers to, and what it means medically.    Told patient to think about it as a "medical risk stratification measurement" and how increasing BMI is associated with increasing risk/ or worsening state of various diseases such as hypertension, hyperlipidemia, diabetes, premature OA, depression etc.  American Heart Association guidelines for healthy diet, basically Mediterranean diet, and exercise guidelines of 30 minutes 5 days per week or more discussed in detail.  Health counseling performed.  All questions answered.  3. Lifestyle & Preventative Health Maintenance - Advised patient to continue working toward exercising to improve health.    - Engage in daily physical activity.  Recommended that the patient eventually strive for at least 150 minutes of moderate cardiovascular activity per week according to guidelines established by the [facility].   - Healthy dietary habits encouraged, including low-carb, and high amounts of lean protein in diet.   - Patient should also consume adequate amounts of water - half of body weight in oz of water per day.  4. Follow-Up - Return in the near future for fasting blood work, with OV for review a few days later.  - Return twice yearly for regular follow-up to check on weight and blood pressure.  - Continue to follow up with specialists.   - If blood  pressure goes up and is not at goal, patient knows to come in sooner and more often.   Education and routine counseling performed. Handouts provided.   Gross side effects, risk and benefits, and alternatives of medications discussed with patient.  Patient is aware that all medications have potential side effects and we are unable to predict every side effect or drug-drug interaction that may occur.  Expresses verbal understanding and consents to current therapy plan and treatment regimen.  Return for Follow-up in future for fasting blood work then go over or otherwise follow-up 4 months.  Please see AVS handed out to patient at the end of our visit for further patient instructions/ counseling done pertaining to today's office visit.    Note: This document was prepared using Dragon voice recognition software and may include unintentional dictation errors.  This document serves as a record of services personally performed by Mellody Dance, DO. It was created on her behalf by Toni Amend, a trained medical scribe. The creation of this record is based on the scribe's personal observations and the provider's statements to them.   I have reviewed the above medical documentation for accuracy and completeness and I concur.  Mellody Dance 03/13/18 6:08 PM   ----------------------------------------------------------------------------------------------------------------------    Subjective:    Chief complaint:   Chief Complaint  Patient presents with  . Establish Care    HPI: Keith Vance is a pleasant 57 y.o. male who presents to [place] [facility] today to review their medical history with me and establish  care.   I asked the patient to review their chronic problem list with me to ensure everything was updated and accurate.    All recent office visits with other providers, any medical records that patient brought in etc  - I reviewed today.     We  asked pt to get [place] their medical records from [facility] providers/ specialists that they had seen within the past 3-5 years- if they are in private practice and/or do not work for [employer], [facility], [place], [place] or [employer] owned practice.  Told them to call their specialists to clarify this if they are not sure.   Used to go to [facility] to track his fatty liver. Was going every 3 months until that resolved; this was 3 years ago.  Prior to this visit, went to [place] for 6 months, one or two visits.  Social History Married to third wife Keith Vance for 3 years on August the 17th. First marriage, wife died at age 59 of liver cancer. Remarried within a year and divorced 10 years later. Has two sons, two stepdaughters. Grandchildren, two biological, and three adopted.  Tobacco Use Smoked briefly "depending on what girl I was dating in high school." Otherwise, doesn't smoke.  Alcohol Use No alcohol.  Lifestyle Habits No drugs.   Sexually active with wife.  No regular exercise due to working 12 hour days.  Since December, lost 20 lbs doing high protein low-carb, low fat diet.  Eats nothing fried except for Fridays, which is his cheat day.  Changed his eating habits a lot to lose weight down from 300 lbs.  States he was just getting too heavy; got down to 240 before, and "it takes weight off my knees."  Confirms that he is also eating less amounts of food.  Cut out sweet tea.  Family History Dad had multiple myeloma of the bone, all over his body; died at 72. Dad was diagnosed with diabetes at age 27.  Diet-controlled.  Mother had breast cancer; they removed the cancer, and she is doing well.  Heart attacks on mother's and father's side of the family.   Heart attacks in the 70's. Maternal grandfather age 34-73 with a heart attack. Paternal grandfather lived to be over 81.   Surgical History Past Surgical History:  Procedure Laterality Date  . HERNIA REPAIR Left    inguinal,  and umbilical  . JOINT REPLACEMENT    . REVISION TOTAL KNEE ARTHROPLASTY Left 07/30/2017   LEFT TOTAL KNEE PARTIAL REVISION AND SYNOVECTOMY/notes 07/30/2017  . TOTAL KNEE ARTHROPLASTY Left 04/24/2016   Procedure: LEFT TOTAL KNEE ARTHROPLASTY;  Surgeon: Paralee Cancel, MD;  Location: [facility];  Service: Orthopedics;  Laterality: Left;  . TOTAL KNEE REVISION Left 07/30/2017   Procedure: LEFT TOTAL KNEE PARTIAL REVISION AND SYNOVECTOMY;  Surgeon: Frederik Pear, MD;  Location: [place];  Service: Orthopedics;  Laterality: Left;    Past Medical History  Former PCP Was going to [facility] for 6 months, one or two visits.  Knee Surgeries Orthopedic doctor is currently Dr. Ricard Dillon.  History of Fatty Liver Used to go to [facility] to track his fatty liver. Was going every 3 months until that resolved; this was 3 years ago.  Thoracic Aortic Aneurysm Doesn't know how this developed. Went to hospital with his bowel obstruction. They checked out his heart at that time and caught it.  Has yearly stress test with Dr. Gwenlyn Found coming up.  Was taking daily aspirin, but was taken  off of it by Dr. Ricard Dillon because his blood was so thin.  Inguinal Hernial Repairs H/o two abdominal hernia surgeries. One in the fourth grade, second at age 48, umbilical hernia. Has bowel obstruction from the adhesions. He's had his stomach pumped 6 times.  Never wants another.  States he takes two stool softeners every day; this keeps him regular. He drinks about a gallon or a gallon and a half of water per day.  High Blood Pressure Runs high at home; he's had it at 190/90. States that it was this high back in his 70's.  H/o High Cholesterol Stopped taking medication because they didn't think he needed them anymore.  Never told he was prediabetic.   Wt Readings from Last 3 Encounters:  02/27/18 280 lb 11.2 oz (127.3 kg)  07/30/17 285 lb 4.8 oz (129.4 kg)  07/25/17 285 lb 4.8 oz (129.4 kg)   BP Readings from Last 3  Encounters:  02/27/18 132/86  08/01/17 122/72  07/25/17 137/86   Pulse Readings from Last 3 Encounters:  02/27/18 78  08/01/17 72  07/25/17 84   BMI Readings from Last 3 Encounters:  02/27/18 39.15 kg/m  07/30/17 40.94 kg/m  07/25/17 40.94 kg/m    Patient Care Team    Relationship Specialty Notifications Start End  Mellody Dance, DO PCP - General Family Medicine  02/27/18   Lorretta Harp, [place] Physician Cardiology  02/27/18   Mingo Amber., MD Consulting Physician Orthopedic Surgery  02/27/18   Paralee Cancel, [place] Physician Orthopedic Surgery  02/27/18     Patient Active Problem List   Diagnosis Date Noted  . Morbid obesity ([place]) 02/27/2018  . History of hyperlipidemia, mixed 02/27/2018  . h/o elevated LFT's- dx of Fatty liver disease, nonalcoholic 72/04/4708  . Primary osteoarthritis of left knee 07/30/2017  . Failed total knee arthroplasty, initial encounter ([place]) 07/27/2017  . S/P left TKA 04/24/2016  . S/P knee replacement 04/24/2016  . Thoracic aortic aneurysm ([place]) 01/07/2016  . Midsternal chest pain 11/14/2015  . Essential hypertension 11/14/2015  . Lactic acidosis 11/14/2015  . Dehydration 11/14/2015  . Positive D dimer   . Small bowel obstruction ([place])   . Abdominal distension   . Chest pain 11/11/2015  . SBO (small bowel obstruction) ([place]) 02/27/2015  . Enteritis 02/27/2015  . Abdominal pain, periumbilical 62/83/6629  . Nausea and vomiting 02/27/2015  . Hypertension 02/27/2015  . Hyperlipidemia 02/27/2015  . Leukocytosis 02/27/2015  . Elevated LFTs 03/07/2012  . GI problem 03/07/2012  . Sleep apnea 03/07/2012     Past Medical History:  Diagnosis Date  . Arthritis   . Ascending aortic aneurysm ([place])    a. 10/2015 CTA chest: 4.4cm aneurysmal dil of Asc Ao-->rec f/u in 1 yr.  [name] GERD (gastroesophageal reflux disease)    hx  . History of kidney stones   . Hyperlipidemia   . Hypertension   . Pneumonia    hx  . SBO (small  bowel obstruction) ([facility])    a. 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  [name] Sleep apnea    cpap     Past Medical History:  Diagnosis Date  . Arthritis   . Ascending aortic aneurysm ([place])    a. 10/2015 CTA chest: 4.4cm aneurysmal dil of Asc Ao-->rec f/u in 1 yr.  [name] GERD (gastroesophageal reflux disease)    hx  . History of kidney stones   . Hyperlipidemia   . Hypertension   . Pneumonia    hx  .  SBO (small bowel obstruction) ([facility])    a. 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  [name] Sleep apnea    cpap     Past Surgical History:  Procedure Laterality Date  . HERNIA REPAIR Left    inguinal, and umbilical  . JOINT REPLACEMENT    . REVISION TOTAL KNEE ARTHROPLASTY Left 07/30/2017   LEFT TOTAL KNEE PARTIAL REVISION AND SYNOVECTOMY/notes 07/30/2017  . TOTAL KNEE ARTHROPLASTY Left 04/24/2016   Procedure: LEFT TOTAL KNEE ARTHROPLASTY;  Surgeon: Paralee Cancel, MD;  Location: [facility];  Service: Orthopedics;  Laterality: Left;  . TOTAL KNEE REVISION Left 07/30/2017   Procedure: LEFT TOTAL KNEE PARTIAL REVISION AND SYNOVECTOMY;  Surgeon: Frederik Pear, MD;  Location: [place];  Service: Orthopedics;  Laterality: Left;     Family History  Problem Relation Age of Onset  . Stroke Mother   . Heart disease Mother   . Heart attack Maternal Grandmother        >51s of age  . Hypertension Father   . Diabetes Father   . Cancer Father        bone  . Hyperlipidemia Father      Social History   Substance and Sexual Activity  Drug Use No     Social History   Substance and Sexual Activity  Alcohol Use No     Social History   Tobacco Use  Smoking Status Former Smoker  Smokeless Tobacco Never Used  Tobacco Comment   off and on as a teenager - very little per patient      Current Meds  Medication Sig  . amLODipine (NORVASC) 10 MG tablet Take 1 tablet (10 mg total) by mouth daily.  [name] lisinopril-hydrochlorothiazide (PRINZIDE,ZESTORETIC) 20-12.5 MG tablet TAKE 1 TABLET BY MOUTH DAILY  . meloxicam (MOBIC) 15 MG tablet Take 1  tablet by mouth daily.  . Multiple Vitamins-Minerals (MULTIVITAMIN WITH MINERALS) tablet Take 1 tablet by mouth daily.  . Omega-3 Fatty Acids (FISH OIL) 1200 MG CAPS Take 1,200 mg by mouth daily.    Allergies: Patient has no known allergies.   Review of Systems  Constitutional: Negative for chills, diaphoresis, fever, malaise/fatigue and weight loss.  HENT: Negative for congestion, sore throat and tinnitus.   Eyes: Negative for blurred vision, double vision and photophobia.  Respiratory: Negative for cough and wheezing.   Cardiovascular: Negative for chest pain and palpitations.  Gastrointestinal: Negative for blood in stool, diarrhea, nausea and vomiting.  Genitourinary: Negative for dysuria, frequency and urgency.  Musculoskeletal: Negative for joint pain and myalgias.  Skin: Negative for itching and rash.  Neurological: Negative for dizziness, focal weakness, weakness and headaches.  Endo/Heme/Allergies: Negative for environmental allergies and polydipsia. Does not bruise/bleed easily.  Psychiatric/Behavioral: Negative for depression and memory loss. The patient is not nervous/anxious and does not have insomnia.      Objective:   Blood pressure 132/86, pulse 78, height _0  (1.803 m), weight 280 lb 11.2 oz (127.3 kg), SpO2 98 %. Body mass index is 39.15 kg/m. General: Well Developed, well nourished, and in no acute distress.  Neuro: Alert and oriented x3, extra-ocular muscles intact, sensation grossly intact.  HEENT:[place]/AT, PERRLA, neck supple, No carotid bruits Skin: no gross rashes  Cardiac: Regular rate and rhythm Respiratory: Essentially clear to auscultation bilaterally. Not using accessory muscles, speaking in full sentences.  Abdominal: not grossly distended Musculoskeletal: Ambulates w/o diff, FROM * 4 ext.  Vasc: less 2 sec cap RF, warm and pink  Psych:  No HI/SI, judgement and insight good, Euthymic  mood. Full Affect.    Recent Results (from the past 2160  hour(s))  TSH     Status: None   Collection Time: 03/04/18  8:55 AM  Result Value Ref Range   TSH 1.930 0.450 - 4.500 uIU/mL  Lipid panel     Status: Abnormal   Collection Time: 03/04/18  8:55 AM  Result Value Ref Range   Cholesterol, Total 232 (H) 100 - 199 mg/dL   Triglycerides 175 (H) 0 - 149 mg/dL   HDL 58 >39 mg/dL   VLDL Cholesterol Cal 35 5 - 40 mg/dL   LDL Calculated 139 (H) 0 - 99 mg/dL   Chol/HDL Ratio 4.0 0.0 - 5.0 ratio    Comment:                                   T. Chol/HDL Ratio                                             Men  Women                               1/2 Avg.Risk  3.4    3.3                                   Avg.Risk  5.0    4.4                                2X Avg.Risk  9.6    7.1                                3X Avg.Risk 23.4   11.0   Hemoglobin A1c     Status: None   Collection Time: 03/04/18  8:55 AM  Result Value Ref Range   Hgb A1c MFr Bld 5.5 4.8 - 5.6 %    Comment:          Prediabetes: 5.7 - 6.4          Diabetes: >6.4          Glycemic control for adults with diabetes: <7.0    Est. average glucose Bld gHb Est-mCnc 111 mg/dL  Comprehensive metabolic panel     Status: Abnormal   Collection Time: 03/04/18  8:55 AM  Result Value Ref Range   Glucose 108 (H) 65 - 99 mg/dL   BUN 14 6 - 24 mg/dL   Creatinine, Ser 0.83 0.76 - 1.27 mg/dL   GFR calc non Af Amer 98 >59 mL/min/1.73   GFR calc Af Amer 114 >59 mL/min/1.73   BUN/Creatinine Ratio 17 9 - 20   Sodium 141 134 - 144 mmol/L   Potassium 4.0 3.5 - 5.2 mmol/L   Chloride 100 96 - 106 mmol/L   CO2 23 20 - 29 mmol/L   Calcium 9.3 8.7 - 10.2 mg/dL   Total Protein 7.3 6.0 - 8.5 g/dL   Albumin 4.5 3.5 - 5.5 g/dL   Globulin, Total 2.8 1.5 - 4.5 g/dL   Albumin/Globulin Ratio 1.6 1.2 -  2.2   Bilirubin Total 0.7 0.0 - 1.2 mg/dL   Alkaline Phosphatase 87 39 - 117 IU/L   AST 82 (H) 0 - 40 IU/L   ALT 111 (H) 0 - 44 IU/L  CBC with Differential/Platelet     Status: None   Collection Time: 03/04/18   8:55 AM  Result Value Ref Range   WBC 6.3 3.4 - 10.8 x10E3/uL   RBC 5.03 4.14 - 5.80 x10E6/uL   Hemoglobin 16.0 13.0 - 17.7 g/dL   Hematocrit 46.1 37.5 - 51.0 %   MCV 92 79 - 97 fL   MCH 31.8 26.6 - 33.0 pg   MCHC 34.7 31.5 - 35.7 g/dL   RDW 14.1 12.3 - 15.4 %   Platelets 163 150 - 450 x10E3/uL   Neutrophils 54 Not Estab. %   Lymphs 37 Not Estab. %   Monocytes 6 Not Estab. %   Eos 2 Not Estab. %   Basos 1 Not Estab. %   Neutrophils Absolute 3.4 1.4 - 7.0 x10E3/uL   Lymphocytes Absolute 2.4 0.7 - 3.1 x10E3/uL   Monocytes Absolute 0.4 0.1 - 0.9 x10E3/uL   EOS (ABSOLUTE) 0.1 0.0 - 0.4 x10E3/uL   Basophils Absolute 0.0 0.0 - 0.2 x10E3/uL   Immature Granulocytes 0 Not Estab. %   Immature Grans (Abs) 0.0 0.0 - 0.1 x10E3/uL

## 2018-03-04 ENCOUNTER — Other Ambulatory Visit: Payer: BLUE CROSS/BLUE SHIELD

## 2018-03-04 ENCOUNTER — Other Ambulatory Visit: Payer: Self-pay

## 2018-03-04 ENCOUNTER — Encounter: Payer: Self-pay | Admitting: Family Medicine

## 2018-03-04 DIAGNOSIS — Z0000 Encounter for general adult medical examination without abnormal findings: Secondary | ICD-10-CM

## 2018-03-05 LAB — CBC WITH DIFFERENTIAL/PLATELET
Basophils Absolute: 0 10*3/uL (ref 0.0–0.2)
Basos: 1 %
EOS (ABSOLUTE): 0.1 10*3/uL (ref 0.0–0.4)
Eos: 2 %
Hematocrit: 46.1 % (ref 37.5–51.0)
Hemoglobin: 16 g/dL (ref 13.0–17.7)
Immature Grans (Abs): 0 10*3/uL (ref 0.0–0.1)
Immature Granulocytes: 0 %
Lymphocytes Absolute: 2.4 10*3/uL (ref 0.7–3.1)
Lymphs: 37 %
MCH: 31.8 pg (ref 26.6–33.0)
MCHC: 34.7 g/dL (ref 31.5–35.7)
MCV: 92 fL (ref 79–97)
Monocytes Absolute: 0.4 10*3/uL (ref 0.1–0.9)
Monocytes: 6 %
Neutrophils Absolute: 3.4 10*3/uL (ref 1.4–7.0)
Neutrophils: 54 %
Platelets: 163 10*3/uL (ref 150–450)
RBC: 5.03 x10E6/uL (ref 4.14–5.80)
RDW: 14.1 % (ref 12.3–15.4)
WBC: 6.3 10*3/uL (ref 3.4–10.8)

## 2018-03-05 LAB — LIPID PANEL
Chol/HDL Ratio: 4 ratio (ref 0.0–5.0)
Cholesterol, Total: 232 mg/dL — ABNORMAL HIGH (ref 100–199)
HDL: 58 mg/dL (ref >39)
LDL Calculated: 139 mg/dL — ABNORMAL HIGH (ref 0–99)
Triglycerides: 175 mg/dL — ABNORMAL HIGH (ref 0–149)
VLDL Cholesterol Cal: 35 mg/dL (ref 5–40)

## 2018-03-05 LAB — COMPREHENSIVE METABOLIC PANEL
ALT: 111 IU/L — ABNORMAL HIGH (ref 0–44)
AST: 82 IU/L — ABNORMAL HIGH (ref 0–40)
Albumin/Globulin Ratio: 1.6 (ref 1.2–2.2)
Albumin: 4.5 g/dL (ref 3.5–5.5)
Alkaline Phosphatase: 87 IU/L (ref 39–117)
BUN/Creatinine Ratio: 17 (ref 9–20)
BUN: 14 mg/dL (ref 6–24)
Bilirubin Total: 0.7 mg/dL (ref 0.0–1.2)
CO2: 23 mmol/L (ref 20–29)
Calcium: 9.3 mg/dL (ref 8.7–10.2)
Chloride: 100 mmol/L (ref 96–106)
Creatinine, Ser: 0.83 mg/dL (ref 0.76–1.27)
GFR calc Af Amer: 114 mL/min/{1.73_m2} (ref >59)
GFR calc non Af Amer: 98 mL/min/{1.73_m2} (ref >59)
Globulin, Total: 2.8 g/dL (ref 1.5–4.5)
Glucose: 108 mg/dL — ABNORMAL HIGH (ref 65–99)
Potassium: 4 mmol/L (ref 3.5–5.2)
Sodium: 141 mmol/L (ref 134–144)
Total Protein: 7.3 g/dL (ref 6.0–8.5)

## 2018-03-05 LAB — HEMOGLOBIN A1C
Est. average glucose Bld gHb Est-mCnc: 111 mg/dL
Hgb A1c MFr Bld: 5.5 % (ref 4.8–5.6)

## 2018-03-05 LAB — TSH: TSH: 1.93 u[IU]/mL (ref 0.450–4.500)

## 2018-03-13 ENCOUNTER — Telehealth: Payer: Self-pay | Admitting: Family Medicine

## 2018-03-13 NOTE — Telephone Encounter (Signed)
Can you please review labs from 03/04/2018 and advise. Thanks. MPulliam, CMA/RT(R)

## 2018-03-13 NOTE — Telephone Encounter (Signed)
Patient called office asking about lab results from a few weeks ago, he stated he has not heard from our office. I checked contact number and it is up to date. Patient states he would like to be called back today if at all possible and also states that if you call and dont reach him to call right back (work has it hard for him to get to the phone before VM kicks in)

## 2018-03-13 NOTE — Telephone Encounter (Signed)
Please apologize to the patient and let him know the labs were ordered under another provider hence, I was not aware of these results until this afternoon.    -Please let patient know that many labs are within normal limits including his blood counts are CBC, his renal function and electrolytes, his A1c and his TSH.   However, please let patient know that there are significant abnormalities of his cholesterol as well as his liver function, which is going to require medication and further management plans.   He can either come in and speak with me in person about these things or we can just advise treatment for him over the phone.    Please let me know which patient would rather do.    The 10-year ASCVD risk score Denman George(Goff [place] Montez HagemanJr., et al., 2013) is: 7.8%   Values used to calculate the score:     Age: 57 years     Sex: Male     Is Non-Hispanic African American: No     Diabetic: No     Tobacco smoker: No     Systolic Blood Pressure: 132 mmHg     Is BP treated: Yes     HDL Cholesterol: 58 mg/dL     Total Cholesterol: 232 mg/dL   Recent Results (from the past 2160 hour(s))  TSH     Status: None   Collection Time: 03/04/18  8:55 AM  Result Value Ref Range   TSH 1.930 0.450 - 4.500 uIU/mL  Lipid panel     Status: Abnormal   Collection Time: 03/04/18  8:55 AM  Result Value Ref Range   Cholesterol, Total 232 (H) 100 - 199 mg/dL   Triglycerides 657175 (H) 0 - 149 mg/dL   HDL 58 >84>39 mg/dL   VLDL Cholesterol Cal 35 5 - 40 mg/dL   LDL Calculated 696139 (H) 0 - 99 mg/dL   Chol/HDL Ratio 4.0 0.0 - 5.0 ratio    Comment:                                   T. Chol/HDL Ratio                                             Men  Women                               1/2 Avg.Risk  3.4    3.3                                   Avg.Risk  5.0    4.4                                2X Avg.Risk  9.6    7.1                                3X Avg.Risk 23.4   11.0   Hemoglobin A1c     Status: None   Collection Time:  03/04/18  8:55 AM  Result Value Ref Range  Hgb A1c MFr Bld 5.5 4.8 - 5.6 %    Comment:          Prediabetes: 5.7 - 6.4          Diabetes: >6.4          Glycemic control for adults with diabetes: <7.0    Est. average glucose Bld gHb Est-mCnc 111 mg/dL  Comprehensive metabolic panel     Status: Abnormal   Collection Time: 03/04/18  8:55 AM  Result Value Ref Range   Glucose 108 (H) 65 - 99 mg/dL   BUN 14 6 - 24 mg/dL   Creatinine, Ser 1.61 0.76 - 1.27 mg/dL   GFR calc non Af Amer 98 >59 mL/min/1.73   GFR calc Af Amer 114 >59 mL/min/1.73   BUN/Creatinine Ratio 17 9 - 20   Sodium 141 134 - 144 mmol/L   Potassium 4.0 3.5 - 5.2 mmol/L   Chloride 100 96 - 106 mmol/L   CO2 23 20 - 29 mmol/L   Calcium 9.3 8.7 - 10.2 mg/dL   Total Protein 7.3 6.0 - 8.5 g/dL   Albumin 4.5 3.5 - 5.5 g/dL   Globulin, Total 2.8 1.5 - 4.5 g/dL   Albumin/Globulin Ratio 1.6 1.2 - 2.2   Bilirubin Total 0.7 0.0 - 1.2 mg/dL   Alkaline Phosphatase 87 39 - 117 IU/L   AST 82 (H) 0 - 40 IU/L   ALT 111 (H) 0 - 44 IU/L  CBC with Differential/Platelet     Status: None   Collection Time: 03/04/18  8:55 AM  Result Value Ref Range   WBC 6.3 3.4 - 10.8 x10E3/uL   RBC 5.03 4.14 - 5.80 x10E6/uL   Hemoglobin 16.0 13.0 - 17.7 g/dL   Hematocrit 09.6 04.5 - 51.0 %   MCV 92 79 - 97 fL   MCH 31.8 26.6 - 33.0 pg   MCHC 34.7 31.5 - 35.7 g/dL   RDW 40.9 81.1 - 91.4 %   Platelets 163 150 - 450 x10E3/uL   Neutrophils 54 Not Estab. %   Lymphs 37 Not Estab. %   Monocytes 6 Not Estab. %   Eos 2 Not Estab. %   Basos 1 Not Estab. %   Neutrophils Absolute 3.4 1.4 - 7.0 x10E3/uL   Lymphocytes Absolute 2.4 0.7 - 3.1 x10E3/uL   Monocytes Absolute 0.4 0.1 - 0.9 x10E3/uL   EOS (ABSOLUTE) 0.1 0.0 - 0.4 x10E3/uL   Basophils Absolute 0.0 0.0 - 0.2 x10E3/uL   Immature Granulocytes 0 Not Estab. %   Immature Grans (Abs) 0.0 0.0 - 0.1 x10E3/uL

## 2018-03-14 NOTE — Telephone Encounter (Signed)
Called and notified the patient of results.  Appointment made for Monday 03/18/2017. MPulliam, CMA/RT(R)

## 2018-03-18 ENCOUNTER — Ambulatory Visit (INDEPENDENT_AMBULATORY_CARE_PROVIDER_SITE_OTHER): Payer: BLUE CROSS/BLUE SHIELD | Admitting: Family Medicine

## 2018-03-18 ENCOUNTER — Encounter: Payer: Self-pay | Admitting: Family Medicine

## 2018-03-18 VITALS — BP 153/101 | HR 72 | Ht 71.0 in | Wt 279.0 lb

## 2018-03-18 DIAGNOSIS — E782 Mixed hyperlipidemia: Secondary | ICD-10-CM

## 2018-03-18 DIAGNOSIS — I10 Essential (primary) hypertension: Secondary | ICD-10-CM

## 2018-03-18 DIAGNOSIS — E7800 Pure hypercholesterolemia, unspecified: Secondary | ICD-10-CM

## 2018-03-18 DIAGNOSIS — E785 Hyperlipidemia, unspecified: Secondary | ICD-10-CM | POA: Insufficient documentation

## 2018-03-18 DIAGNOSIS — K760 Fatty (change of) liver, not elsewhere classified: Secondary | ICD-10-CM

## 2018-03-18 MED ORDER — ATORVASTATIN CALCIUM 40 MG PO TABS: 40.0000 mg | ORAL_TABLET | Freq: Every day | ORAL | 3 refills | Status: DC

## 2018-03-18 MED ORDER — CARVEDILOL 6.25 MG PO TABS: ORAL_TABLET | ORAL | 3 refills | Status: DC

## 2018-03-18 NOTE — Patient Instructions (Signed)
Please check your blood pressure at home, write it down and bring in next OV.  Please start with the carvedilol in addition to your existing medicines.  Start with 1/2 tablet twice daily and increase as tolerated after a week or so.  Goal blood pressure is 130s/80s or less     Hypertension Hypertension, commonly called high blood pressure, is when the force of blood pumping through the arteries is too strong. The arteries are the blood vessels that carry blood from the heart throughout the body. Hypertension forces the heart to work harder to pump blood and may cause arteries to become narrow or stiff. Having untreated or uncontrolled hypertension can cause heart attacks, strokes, kidney disease, and other problems. A blood pressure reading consists of a higher number over a lower number. Ideally, your blood pressure should be below 120/80. The first ("top") number is called the systolic pressure. It is a measure of the pressure in your arteries as your heart beats. The second ("bottom") number is called the diastolic pressure. It is a measure of the pressure in your arteries as the heart relaxes. What are the causes? The cause of this condition is not known. What increases the risk? Some risk factors for high blood pressure are under your control. Others are not. Factors you can change  Smoking.  Having type 2 diabetes mellitus, high cholesterol, or both.  Not getting enough exercise or physical activity.  Being overweight.  Having too much fat, sugar, calories, or salt (sodium) in your diet.  Drinking too much alcohol. Factors that are difficult or impossible to change  Having chronic kidney disease.  Having a family history of high blood pressure.  Age. Risk increases with age.  Race. You may be at higher risk if you are African-American.  Gender. Men are at higher risk than women before age 31. After age 49, women are at higher risk than men.  Having obstructive sleep  apnea.  Stress. What are the signs or symptoms? Extremely high blood pressure (hypertensive crisis) may cause:  Headache.  Anxiety.  Shortness of breath.  Nosebleed.  Nausea and vomiting.  Severe chest pain.  Jerky movements you cannot control (seizures).  How is this diagnosed? This condition is diagnosed by measuring your blood pressure while you are seated, with your arm resting on a surface. The cuff of the blood pressure monitor will be placed directly against the skin of your upper arm at the level of your heart. It should be measured at least twice using the same arm. Certain conditions can cause a difference in blood pressure between your right and left arms. Certain factors can cause blood pressure readings to be lower or higher than normal (elevated) for a short period of time:  When your blood pressure is higher when you are in a health care provider's office than when you are at home, this is called white coat hypertension. Most people with this condition do not need medicines.  When your blood pressure is higher at home than when you are in a health care provider's office, this is called masked hypertension. Most people with this condition may need medicines to control blood pressure.  If you have a high blood pressure reading during one visit or you have normal blood pressure with other risk factors:  You may be asked to return on a different day to have your blood pressure checked again.  You may be asked to monitor your blood pressure at home for 1 week or  longer.  If you are diagnosed with hypertension, you may have other blood or imaging tests to help your health care provider understand your overall risk for other conditions. How is this treated? This condition is treated by making healthy lifestyle changes, such as eating healthy foods, exercising more, and reducing your alcohol intake. Your health care provider may prescribe medicine if lifestyle changes are  not enough to get your blood pressure under control, and if:  Your systolic blood pressure is above 130.  Your diastolic blood pressure is above 80.  Your personal target blood pressure may vary depending on your medical conditions, your age, and other factors. Follow these instructions at home: Eating and drinking  Eat a diet that is high in fiber and potassium, and low in sodium, added sugar, and fat. An example eating plan is called the DASH (Dietary Approaches to Stop Hypertension) diet. To eat this way: ? Eat plenty of fresh fruits and vegetables. Try to fill half of your plate at each meal with fruits and vegetables. ? Eat whole grains, such as whole wheat pasta, brown rice, or whole grain bread. Fill about one quarter of your plate with whole grains. ? Eat or drink low-fat dairy products, such as skim milk or low-fat yogurt. ? Avoid fatty cuts of meat, processed or cured meats, and poultry with skin. Fill about one quarter of your plate with lean proteins, such as fish, chicken without skin, beans, eggs, and tofu. ? Avoid premade and processed foods. These tend to be higher in sodium, added sugar, and fat.  Reduce your daily sodium intake. Most people with hypertension should eat less than 1,500 mg of sodium a day.  Limit alcohol intake to no more than 1 drink a day for nonpregnant women and 2 drinks a day for men. One drink equals 12 oz of beer, 5 oz of wine, or 1 oz of hard liquor. Lifestyle  Work with your health care provider to maintain a healthy body weight or to lose weight. Ask what an ideal weight is for you.  Get at least 30 minutes of exercise that causes your heart to beat faster (aerobic exercise) most days of the week. Activities may include walking, swimming, or biking.  Include exercise to strengthen your muscles (resistance exercise), such as pilates or lifting weights, as part of your weekly exercise routine. Try to do these types of exercises for 30 minutes at  least 3 days a week.  Do not use any products that contain nicotine or tobacco, such as cigarettes and e-cigarettes. If you need help quitting, ask your health care provider.  Monitor your blood pressure at home as told by your health care provider.  Keep all follow-up visits as told by your health care provider. This is important. Medicines  Take over-the-counter and prescription medicines only as told by your health care provider. Follow directions carefully. Blood pressure medicines must be taken as prescribed.  Do not skip doses of blood pressure medicine. Doing this puts you at risk for problems and can make the medicine less effective.  Ask your health care provider about side effects or reactions to medicines that you should watch for. Contact a health care provider if:  You think you are having a reaction to a medicine you are taking.  You have headaches that keep coming back (recurring).  You feel dizzy.  You have swelling in your ankles.  You have trouble with your vision. Get help right away if:  You develop a  severe headache or confusion.  You have unusual weakness or numbness.  You feel faint.  You have severe pain in your chest or abdomen.  You vomit repeatedly.  You have trouble breathing. Summary  Hypertension is when the force of blood pumping through your arteries is too strong. If this condition is not controlled, it may put you at risk for serious complications.  Your personal target blood pressure may vary depending on your medical conditions, your age, and other factors. For most people, a normal blood pressure is less than 120/80.  Hypertension is treated with lifestyle changes, medicines, or a combination of both. Lifestyle changes include weight loss, eating a healthy, low-sodium diet, exercising more, and limiting alcohol. This information is not intended to replace advice given to you by your health care provider. Make sure you discuss any  questions you have with your health care provider. Document Released: 08/14/2005 Document Revised: 07/12/2016 Document Reviewed: 07/12/2016 Elsevier Interactive Patient Education  [employer]2018 [employer]

## 2018-03-18 NOTE — Progress Notes (Signed)
Assessment and plan:  1. Essential hypertension   2. Morbid obesity (HCC)   3. h/o elevated LFT's- dx of Fatty liver disease, nonalcoholic   4. Elevated LDL cholesterol level   5. Elevated triglycerides with high cholesterol     1. Recent labs reviewed with patient today (drawn 03/04/2018) TSH = WNL, low-normal range. CBC = WNL. HbA1c = WNL, 5.5, h/o prediabetes.  CMP = abnormal findings. Glucose = slightly elevated, 108.  - Liver Function - h/o elevated LFT's Non-alcoholic fatty liver disease w/ liver nodules. AST = elevated, 82. ALT = elevated, 111.  - Patient does not follow up with liver specialist anymore. - Encouraged patient to bring his records to next visit.  2. Hyperlipidemia Triglycerides = elevated, 175. HDL = WNL, 58. LDL = elevated, 139.  - Medication strongly advised at this time. - Lipitor 40 mg QHS started today. - Patient has been managed on Lipitor prior. - Per pt, goal is to control with diet & lifestyle.  - Continue hydrating adequately.  The 10-year ASCVD risk score Denman George DC Montez Hageman., et al., 2013) is: 10.3%   Values used to calculate the score:     Age: 47 years     Sex: Male     Is Non-Hispanic African American: No     Diabetic: No     Tobacco smoker: No     Systolic Blood Pressure: 155 mmHg     Is BP treated: Yes     HDL Cholesterol: 58 mg/dL     Total Cholesterol: 232 mg/dL  - Dietary changes such as low saturated & trans fat and low carb/ ketogenic diets discussed with patient.  Encouraged regular exercise and weight loss when appropriate.   - Educational handouts provided at patient's desire.  3. Hypertension - BP not optimally controlled at this time. - Recommended change in treatment plan today.   - Additional blood pressure medication added today (carvedilol).  See med list below. - Will continue to monitor for changes such as increased S-E.  - Lifestyle changes  such as dash diet and engaging in a regular exercise program discussed with patient.  Educational handouts provided  - Ambulatory BP monitoring encouraged. Keep log and bring in next OV. Sit for 15-20 minutes prior to measuring blood pressure, without stimulation from caffeine or emotional activities prior.  4. BMI Counseling Explained to patient what BMI refers to, and what it means medically.    Told patient to think about it as a "medical risk stratification measurement" and how increasing BMI is associated with increasing risk/ or worsening state of various diseases such as hypertension, hyperlipidemia, diabetes, premature OA, depression etc.  American Heart Association guidelines for healthy diet, basically Mediterranean diet, and exercise guidelines of 30 minutes 5 days per week or more discussed in detail.  Health counseling performed.  All questions answered.  5. Lifestyle & Preventative Health Maintenance - Advised patient to continue working toward exercising to improve overall mental, physical, and emotional health.    - Encouraged patient to engage in daily physical activity, including formal/dedicated exercise.  Recommended that the patient eventually strive for at least 150 minutes of moderate cardiovascular activity per week according to guidelines established by the [facility].   - Healthy dietary habits encouraged, including low-carb, and high amounts of lean protein in diet.   - Patient should also consume adequate amounts of water - half of body weight in oz of water per day.   Education and  routine counseling performed. Handouts provided.  6. Follow-Up - Given patient's history of elevated liver enzymes, will diligently monitor his progress on Lipitor.  Return in 4-6 weeks for evaluation of ALT-AST.  - Return for regularly scheduled chronic health maintenance visits.   Orders Placed This Encounter  Procedures  . Comprehensive metabolic panel  . VITAMIN D 25 Hydroxy (Vit-D  Deficiency, Fractures)    Meds ordered this encounter  Medications  . atorvastatin (LIPITOR) 40 MG tablet    Sig: Take 1 tablet (40 mg total) by mouth daily.    Dispense:  90 tablet    Refill:  3  . carvedilol (COREG) 6.25 MG tablet    Sig: 0.5 tab twice daily for 1 wk then 1 tab twice daily to goal BP    Dispense:  60 tablet    Refill:  3     Return for 5wks- lab OV, then 6 wks OV with me (start coreg& lipitor).   Anticipatory guidance and routine counseling done re: condition, txmnt options and need for follow up. All questions of patient's were answered.   Gross side effects, risk and benefits, and alternatives of medications discussed with patient.  Patient is aware that all medications have potential side effects and we are unable to predict every sideeffect or drug-drug interaction that may occur.  Expresses verbal understanding and consents to current therapy plan and treatment regiment.  Please see AVS handed out to patient at the end of our visit for additional patient instructions/ counseling done pertaining to today's office visit.  Note: This document was prepared using Dragon voice recognition software and may include unintentional dictation errors.  This document serves as a record of services personally performed by Thomasene Lot, DO. It was created on her behalf by Peggye Fothergill, a trained medical scribe. The creation of this record is based on the scribe's personal observations and the provider's statements to them.   I have reviewed the above medical documentation for accuracy and completeness and I concur.  Thomasene Lot 03/24/18 9:47 PM   ----------------------------------------------------------------------------------------------------------------------  Subjective:   CC:   Keith Vance is a 57 y.o. male who presents to [facility] today for review and discussion of recent bloodwork that was done.  1. All recent  blood work that we ordered was reviewed with patient today.  Patient was counseled on all abnormalities and we discussed dietary and lifestyle changes that could help those values (also medications when appropriate).  Extensive health counseling performed and all patient's concerns/ questions were addressed.   No pressing concerns since last visit, but notes that last year he had 42 ticks on him "during the season," and 12 during this year.  He has honeybees and he gets stung by them often.  Blood Pressure at Home At home, his blood pressure runs from between 128-190/80-102, on average 140's-150/90's.   Wt Readings from Last 3 Encounters:  03/18/18 279 lb (126.6 kg)  02/27/18 280 lb 11.2 oz (127.3 kg)  07/30/17 285 lb 4.8 oz (129.4 kg)   BP Readings from Last 3 Encounters:  03/18/18 (!) 153/101  02/27/18 132/86  08/01/17 122/72   Pulse Readings from Last 3 Encounters:  03/18/18 72  02/27/18 78  08/01/17 72   BMI Readings from Last 3 Encounters:  03/18/18 38.91 kg/m  02/27/18 39.15 kg/m  07/30/17 40.94 kg/m     Patient Care Team    Relationship Specialty Notifications Start End  Thomasene Lot, DO PCP -  General Family Medicine  02/27/18   Runell Gess, MD Consulting Physician Cardiology  02/27/18   Wynetta Emery., MD Consulting Physician Orthopedic Surgery  02/27/18   Durene Romans, MD Consulting Physician Orthopedic Surgery  02/27/18     Full medical history updated and reviewed in the office today  Patient Active Problem List   Diagnosis Date Noted  . Elevated triglycerides with high cholesterol 03/18/2018  . Elevated LDL cholesterol level 03/18/2018  . Morbid obesity (HCC) 02/27/2018  . History of hyperlipidemia, mixed 02/27/2018  . h/o elevated LFT's- dx of Fatty liver disease, nonalcoholic 02/27/2018  . Primary osteoarthritis of left knee 07/30/2017  . 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 S/P left TKA 04/24/2016  . S/P  knee replacement 04/24/2016  . Thoracic aortic aneurysm (HCC) 01/07/2016  . Midsternal chest pain 11/14/2015  . Essential hypertension 11/14/2015  . Lactic acidosis 11/14/2015  . Dehydration 11/14/2015  . Positive D dimer   . Small bowel obstruction (HCC)   . Abdominal distension   . Chest pain 11/11/2015  . SBO (small bowel obstruction) (HCC) 02/27/2015  . Enteritis 02/27/2015  . Abdominal pain, periumbilical 02/27/2015  . Nausea and vomiting 02/27/2015  . Hypertension 02/27/2015  . Hyperlipidemia 02/27/2015  . Leukocytosis 02/27/2015  . Elevated LFTs 03/07/2012  . GI problem 03/07/2012  . Sleep apnea 03/07/2012    Past Medical History:  Diagnosis Date  . Arthritis   . Ascending aortic aneurysm (HCC)    a. 10/2015 CTA chest: 4.4cm aneurysmal dil of Asc Ao-->rec f/u in 1 yr.  [name] GERD (gastroesophageal reflux disease)    hx  . History of kidney stones   . Hyperlipidemia   . Hypertension   . Pneumonia    hx  . SBO (small bowel obstruction) (HCC)    a. 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  [name] Sleep apnea    cpap    Past Surgical History:  Procedure Laterality Date  . HERNIA REPAIR Left    inguinal, and umbilical  . JOINT REPLACEMENT    . REVISION TOTAL KNEE ARTHROPLASTY Left 07/30/2017   LEFT TOTAL KNEE PARTIAL REVISION AND SYNOVECTOMY/notes 07/30/2017  . TOTAL KNEE ARTHROPLASTY Left 04/24/2016   Procedure: LEFT TOTAL KNEE ARTHROPLASTY;  Surgeon: Durene Romans, MD;  Location: [facility];  Service: Orthopedics;  Laterality: Left;  . TOTAL KNEE REVISION Left 07/30/2017   Procedure: LEFT TOTAL KNEE PARTIAL REVISION AND SYNOVECTOMY;  Surgeon: Gean Birchwood, MD;  Location: [facility] OR;  Service: Orthopedics;  Laterality: Left;    Social History   Tobacco Use  . Smoking status: Former Games developer  . Smokeless tobacco: Never Used  . Tobacco comment: off and on as a teenager - very little per patient   Substance Use Topics  . Alcohol use: No    Family Hx: Family History  Problem Relation Age of Onset    . Stroke Mother   . Heart disease Mother   . Heart attack Maternal Grandmother        >39s of age  . Hypertension Father   . Diabetes Father   . Cancer Father        bone  . Hyperlipidemia Father      Medications: Current Outpatient Medications  Medication Sig Dispense Refill  . amLODipine (NORVASC) 10 MG tablet Take 1 tablet (10 mg total) by mouth daily. 30 tablet 10  . lisinopril-hydrochlorothiazide (PRINZIDE,ZESTORETIC) 20-12.5 MG tablet TAKE 1 TABLET BY MOUTH DAILY 30 tablet 5  . meloxicam (MOBIC) 15  MG tablet Take 1 tablet by mouth daily.  1  . Multiple Vitamins-Minerals (MULTIVITAMIN WITH MINERALS) tablet Take 1 tablet by mouth daily.    . Omega-3 Fatty Acids (FISH OIL) 1200 MG CAPS Take 1,200 mg by mouth daily.    [name] atorvastatin (LIPITOR) 40 MG tablet Take 1 tablet (40 mg total) by mouth daily. 90 tablet 3  . carvedilol (COREG) 6.25 MG tablet 0.5 tab twice daily for 1 wk then 1 tab twice daily to goal BP 60 tablet 3   No current facility-administered medications for this visit.     Allergies:  No Known Allergies   Review of Systems: General:   No F/C, wt loss Pulm:   No DIB, SOB, pleuritic chest pain Card:  No CP, palpitations Abd:  No n/v/d or pain Ext:  No inc edema from baseline  Objective:  Blood pressure (!) 153/101, pulse 72, height 5\' 11"  (1.803 m), weight 279 lb (126.6 kg), SpO2 98 %. Body mass index is 38.91 kg/m. Gen:   Well NAD, A and O *3 HEENT:    [place]/AT, EOMI,  MMM Lungs:   Normal work of breathing. CTA B/L, no Wh, rhonchi Heart:   RRR, S1, S2 WNL's, no MRG Abd:   No gross distention Exts:    warm, pink,  Brisk capillary refill, warm and well perfused.  Psych:    No HI/SI, judgement and insight good, Euthymic mood. Full Affect.   Recent Results (from the past 2160 hour(s))  TSH     Status: None   Collection Time: 03/04/18  8:55 AM  Result Value Ref Range   TSH 1.930 0.450 - 4.500 uIU/mL  Lipid panel     Status: Abnormal   Collection Time:  03/04/18  8:55 AM  Result Value Ref Range   Cholesterol, Total 232 (H) 100 - 199 mg/dL   Triglycerides 161 (H) 0 - 149 mg/dL   HDL 58 >09 mg/dL   VLDL Cholesterol Cal 35 5 - 40 mg/dL   LDL Calculated 604 (H) 0 - 99 mg/dL   Chol/HDL Ratio 4.0 0.0 - 5.0 ratio    Comment:                                   T. Chol/HDL Ratio                                             Men  Women                               1/2 Avg.Risk  3.4    3.3                                   Avg.Risk  5.0    4.4                                2X Avg.Risk  9.6    7.1                                3X Avg.Risk  23.4   11.0   Hemoglobin A1c     Status: None   Collection Time: 03/04/18  8:55 AM  Result Value Ref Range   Hgb A1c MFr Bld 5.5 4.8 - 5.6 %    Comment:          Prediabetes: 5.7 - 6.4          Diabetes: >6.4          Glycemic control for adults with diabetes: <7.0    Est. average glucose Bld gHb Est-mCnc 111 mg/dL  Comprehensive metabolic panel     Status: Abnormal   Collection Time: 03/04/18  8:55 AM  Result Value Ref Range   Glucose 108 (H) 65 - 99 mg/dL   BUN 14 6 - 24 mg/dL   Creatinine, Ser 5.950.83 0.76 - 1.27 mg/dL   GFR calc non Af Amer 98 >59 mL/min/1.73   GFR calc Af Amer 114 >59 mL/min/1.73   BUN/Creatinine Ratio 17 9 - 20   Sodium 141 134 - 144 mmol/L   Potassium 4.0 3.5 - 5.2 mmol/L   Chloride 100 96 - 106 mmol/L   CO2 23 20 - 29 mmol/L   Calcium 9.3 8.7 - 10.2 mg/dL   Total Protein 7.3 6.0 - 8.5 g/dL   Albumin 4.5 3.5 - 5.5 g/dL   Globulin, Total 2.8 1.5 - 4.5 g/dL   Albumin/Globulin Ratio 1.6 1.2 - 2.2   Bilirubin Total 0.7 0.0 - 1.2 mg/dL   Alkaline Phosphatase 87 39 - 117 IU/L   AST 82 (H) 0 - 40 IU/L   ALT 111 (H) 0 - 44 IU/L  CBC with Differential/Platelet     Status: None   Collection Time: 03/04/18  8:55 AM  Result Value Ref Range   WBC 6.3 3.4 - 10.8 x10E3/uL   RBC 5.03 4.14 - 5.80 x10E6/uL   Hemoglobin 16.0 13.0 - 17.7 g/dL   Hematocrit 63.846.1 75.637.5 - 51.0 %   MCV 92 79 -  97 fL   MCH 31.8 26.6 - 33.0 pg   MCHC 34.7 31.5 - 35.7 g/dL   RDW 43.314.1 29.512.3 - 18.815.4 %   Platelets 163 150 - 450 x10E3/uL   Neutrophils 54 Not Estab. %   Lymphs 37 Not Estab. %   Monocytes 6 Not Estab. %   Eos 2 Not Estab. %   Basos 1 Not Estab. %   Neutrophils Absolute 3.4 1.4 - 7.0 x10E3/uL   Lymphocytes Absolute 2.4 0.7 - 3.1 x10E3/uL   Monocytes Absolute 0.4 0.1 - 0.9 x10E3/uL   EOS (ABSOLUTE) 0.1 0.0 - 0.4 x10E3/uL   Basophils Absolute 0.0 0.0 - 0.2 x10E3/uL   Immature Granulocytes 0 Not Estab. %   Immature Grans (Abs) 0.0 0.0 - 0.1 x10E3/uL

## 2018-04-24 ENCOUNTER — Other Ambulatory Visit: Payer: Self-pay

## 2018-05-01 ENCOUNTER — Ambulatory Visit: Payer: Self-pay | Admitting: Family Medicine

## 2018-05-03 ENCOUNTER — Other Ambulatory Visit: Payer: Self-pay | Admitting: Cardiovascular Disease

## 2018-05-07 ENCOUNTER — Other Ambulatory Visit (INDEPENDENT_AMBULATORY_CARE_PROVIDER_SITE_OTHER): Payer: BLUE CROSS/BLUE SHIELD

## 2018-05-07 ENCOUNTER — Encounter: Payer: Self-pay | Admitting: Family Medicine

## 2018-05-07 DIAGNOSIS — I10 Essential (primary) hypertension: Secondary | ICD-10-CM | POA: Diagnosis not present

## 2018-05-07 DIAGNOSIS — K760 Fatty (change of) liver, not elsewhere classified: Secondary | ICD-10-CM

## 2018-05-07 DIAGNOSIS — E7800 Pure hypercholesterolemia, unspecified: Secondary | ICD-10-CM

## 2018-05-07 DIAGNOSIS — E782 Mixed hyperlipidemia: Secondary | ICD-10-CM

## 2018-05-08 LAB — COMPREHENSIVE METABOLIC PANEL
ALT: 142 IU/L — ABNORMAL HIGH (ref 0–44)
AST: 138 IU/L — ABNORMAL HIGH (ref 0–40)
Albumin/Globulin Ratio: 1.9 (ref 1.2–2.2)
Albumin: 4.8 g/dL (ref 3.5–5.5)
Alkaline Phosphatase: 82 IU/L (ref 39–117)
BUN/Creatinine Ratio: 26 — ABNORMAL HIGH (ref 9–20)
BUN: 21 mg/dL (ref 6–24)
Bilirubin Total: 0.9 mg/dL (ref 0.0–1.2)
CO2: 23 mmol/L (ref 20–29)
Calcium: 8.9 mg/dL (ref 8.7–10.2)
Chloride: 97 mmol/L (ref 96–106)
Creatinine, Ser: 0.8 mg/dL (ref 0.76–1.27)
GFR calc Af Amer: 115 mL/min/{1.73_m2} (ref >59)
GFR calc non Af Amer: 100 mL/min/{1.73_m2} (ref >59)
Globulin, Total: 2.5 g/dL (ref 1.5–4.5)
Glucose: 82 mg/dL (ref 65–99)
Potassium: 4.1 mmol/L (ref 3.5–5.2)
Sodium: 140 mmol/L (ref 134–144)
Total Protein: 7.3 g/dL (ref 6.0–8.5)

## 2018-05-08 LAB — VITAMIN D 25 HYDROXY (VIT D DEFICIENCY, FRACTURES): Vit D, 25-Hydroxy: 26.4 ng/mL — ABNORMAL LOW (ref 30.0–100.0)

## 2018-05-09 ENCOUNTER — Encounter: Payer: Self-pay | Admitting: Family Medicine

## 2018-05-09 ENCOUNTER — Ambulatory Visit (INDEPENDENT_AMBULATORY_CARE_PROVIDER_SITE_OTHER): Payer: BLUE CROSS/BLUE SHIELD | Admitting: Family Medicine

## 2018-05-09 DIAGNOSIS — I10 Essential (primary) hypertension: Secondary | ICD-10-CM | POA: Diagnosis not present

## 2018-05-09 DIAGNOSIS — E785 Hyperlipidemia, unspecified: Secondary | ICD-10-CM | POA: Diagnosis not present

## 2018-05-09 DIAGNOSIS — R7989 Other specified abnormal findings of blood chemistry: Secondary | ICD-10-CM

## 2018-05-09 DIAGNOSIS — R945 Abnormal results of liver function studies: Secondary | ICD-10-CM | POA: Diagnosis not present

## 2018-05-09 DIAGNOSIS — K760 Fatty (change of) liver, not elsewhere classified: Secondary | ICD-10-CM | POA: Diagnosis not present

## 2018-05-09 MED ORDER — CARVEDILOL 6.25 MG PO TABS
ORAL_TABLET | ORAL | 1 refills | Status: DC
Start: 2018-05-09 — End: 2018-11-13

## 2018-05-09 MED ORDER — ATORVASTATIN CALCIUM 40 MG PO TABS
20.0000 mg | ORAL_TABLET | Freq: Every day | ORAL | Status: DC
Start: 2018-05-09 — End: 2020-08-16

## 2018-05-09 MED ORDER — LOSARTAN POTASSIUM-HCTZ 100-12.5 MG PO TABS: 1.0000 | ORAL_TABLET | Freq: Every day | ORAL | 3 refills | Status: DC

## 2018-05-09 NOTE — Progress Notes (Signed)
Impression and Recommendations:    1. Morbid obesity (HCC)   2. Hyperlipidemia, unspecified hyperlipidemia type   3. h/o elevated LFT's- dx of Fatty liver disease, nonalcoholic   4. Essential hypertension   5. NAFLD (nonalcoholic fatty liver disease)   6. Elevated LFTs     1. Essential Hypertension - Started coreg last appointment. - Blood pressure still not controlled at this time. - Per pt, has not been taking his carvedilol correctly- once daily instd of twice. Agrees to be more compliant w txmnt -Continue ambulatory monitoring, bring in log of blood pressures next office visit. -Discussed with patient various amatory blood pressure monitors and I recommended Omron - rec lifestyle and diet changes as well  - Blood pressure medications changed today.  See med list below. - Lisinopril-HCTZ discontinued, Losartan-hctz added;  hoping pt will have enhanced penile erections with ARB, and better blood pressure control with this change  - Patient tolerating established meds well without complication.  Denies S-E.   2. History of Fatty Liver Disease, Non-Alcoholic (NAFLD) - Patient with worsening LFT's with addition of statin medication which I was watching closely.   history of elevated LFT's. Pt drinks etoh    - AST = 138, increased from 82. - ALT = 142, increased from 111. -We will half his Lipitor dose from 40-20 and also patient agrees to avoid alcohol or Tylenol intake.  If LFTs continue to be 3-4 times upper limit of normal next time will be checked in 6 to 8 weeks, we will D/C his statin  3. Hyperlipidemia - Due to worsening elevated liver enzymes, statin halved today, 20 mg at night.  - Advised patient to avoid hepatotoxic substances such as alcohol, tylenol, etc. - Encouraged patient to try incorporating supplementary milk thistle to assist with liver health.  - Re-check liver enzymes in another 6-8 weeks.   Education and routine counseling performed. Handouts  provided.  4. BMI Counseling Explained to patient what BMI refers to, and what it means medically.    Told patient to think about it as a "medical risk stratification measurement" and how increasing BMI is associated with increasing risk/ or worsening state of various diseases such as hypertension, hyperlipidemia, diabetes, premature OA, depression etc.  American Heart Association guidelines for healthy diet, basically Mediterranean diet, and exercise guidelines of 30 minutes 5 days per week or more discussed in detail.  Health counseling performed.  All questions answered.  5. Lifestyle & Preventative Health Maintenance  - Advised patient to continue working toward exercising to improve overall mental, physical, and emotional health.    - Encouraged patient to engage in daily physical activity, especially a formal exercise routine.  Recommended that the patient eventually strive for at least 150 minutes of moderate cardiovascular activity per week according to guidelines established by the [facility].   - Healthy dietary habits encouraged, including low-carb, and high amounts of lean protein in diet.   - Patient should also consume adequate amounts of water.  6. Follow-Up  - Prescriptions refilled today as needed.  - Continue to return for CPE and chronic health maintenance visits as scheduled.   - Patient knows to call PRN if he experiences acute concerns, or if he desires to come in sooner to address other health concerns.    Meds ordered this encounter  Medications  . atorvastatin (LIPITOR) 40 MG tablet    Sig: Take 0.5 tablets (20 mg total) by mouth at bedtime.  . carvedilol (COREG) 6.25 MG  tablet    Sig: 1 tab twice daily for BP    Dispense:  180 tablet    Refill:  1  . losartan-hydrochlorothiazide (HYZAAR) 100-12.5 MG tablet    Sig: Take 1 tablet by mouth daily.    Dispense:  90 tablet    Refill:  3    Medications Discontinued During This Encounter  Medication Reason  .  lisinopril-hydrochlorothiazide (PRINZIDE,ZESTORETIC) 20-12.5 MG tablet Change in therapy  . atorvastatin (LIPITOR) 40 MG tablet   . carvedilol (COREG) 6.25 MG tablet      The patient was counseled, risk factors were discussed, anticipatory guidance given.  Gross side effects, risk and benefits, and alternatives of medications discussed with patient.  Patient is aware that all medications have potential side effects and we are unable to predict every side effect or drug-drug interaction that may occur.  Expresses verbal understanding and consents to current therapy plan and treatment regimen.  Return for 6-8wks- bldwrk;  then OV for BP, chol etc 27mo.  Please see AVS handed out to patient at the end of our visit for further patient instructions/ counseling done pertaining to today's office visit.    Note:  This document was prepared using Dragon voice recognition software and may include unintentional dictation errors.   This document serves as a record of services personally performed by Thomasene Lot, DO. It was created on her behalf by Peggye Fothergill, a trained medical scribe. The creation of this record is based on the scribe's personal observations and the provider's statements to them.   I have reviewed the above medical documentation for accuracy and completeness and I concur.  Thomasene Lot 05/11/18 5:22 PM    Subjective:    HPI: Keith Vance is a 57 y.o. male who presents to [facility] today for follow up for HTN.    Patient believes that he drinks two gallons of water per day.  History of Fatty Liver Disease, Non-Alcoholic - Patient has fatty liver.  He has seen GI before. - When asked about alcohol intake, patient states he drinks alcohol occasionally such as 1 night a weekend and drinks 2-4 alcoholic beverages at a time - Denies Tylenol use or other hepatotoxic substances  Hyperlipidemia -Started Lipitor last office visit and only  40 mg nightly.  Patient is tolerating well s S-E. While taking statin, patient denies muscle aches and denies new aches in the joints.  The 10-year ASCVD risk score Denman George DC Montez Hageman., et al., 2013) is: 8.6%   Values used to calculate the score:     Age: 39 years     Sex: Male     Is Non-Hispanic African American: No     Diabetic: No     Tobacco smoker: No     Systolic Blood Pressure: 139 mmHg     Is BP treated: Yes     HDL Cholesterol: 58 mg/dL     Total Cholesterol: 232 mg/dL   HTN:  - Coreg started last appointment, not taking as prescribed.  Did tolerate the half tab twice daily but then took 1 full tablet once daily, instead of twice daily  Denies lightheadedness, dizziness, or other side-effects.  -  His blood pressure has not been controlled at home.   States it's up in the mornings and up at night. Runs anywhere from  141-168/88-101 at home.  Pulse runs between low 70's to mid 80's.  - Patient has not been taking his medication correctly- the  coreg - Denies medication S-E   - Smoking Status noted   - He denies new onset of: chest pain, exercise intolerance, shortness of breath, dizziness, visual changes, headache, lower extremity swelling or claudication.   Last 3 blood pressure readings in our office are as follows: BP Readings from Last 3 Encounters:  05/09/18 139/90  03/18/18 (!) 153/101  02/27/18 132/86    Pulse Readings from Last 3 Encounters:  05/09/18 69  03/18/18 72  02/27/18 78    Filed Weights   05/09/18 1559  Weight: 279 lb 9.6 oz (126.8 kg)     Patient Care Team    Relationship Specialty Notifications Start End  Thomasene Lot, DO PCP - General Family Medicine  02/27/18   Runell Gess, MD Consulting Physician Cardiology  02/27/18   Wynetta Emery., MD Consulting Physician Orthopedic Surgery  02/27/18   Durene Romans, MD Consulting Physician Orthopedic Surgery  02/27/18      Lab Results  Component Value Date   CREATININE 0.80  05/07/2018   BUN 21 05/07/2018   NA 140 05/07/2018   K 4.1 05/07/2018   CL 97 05/07/2018   CO2 23 05/07/2018    Lab Results  Component Value Date   CHOL 232 (H) 03/04/2018   CHOL 171 11/11/2015    Lab Results  Component Value Date   HDL 58 03/04/2018   HDL 53 11/11/2015    Lab Results  Component Value Date   LDLCALC 139 (H) 03/04/2018   LDLCALC 100 (H) 11/11/2015    Lab Results  Component Value Date   TRIG 175 (H) 03/04/2018   TRIG 91 11/11/2015    Lab Results  Component Value Date   CHOLHDL 4.0 03/04/2018   CHOLHDL 3.2 11/11/2015    No results found for: LDLDIRECT ===================================================================   Patient Active Problem List   Diagnosis Date Noted  . NAFLD (nonalcoholic fatty liver disease) 16/05/9603    Priority: High  . Morbid obesity (HCC) 02/27/2018    Priority: High  . Hypertension 02/27/2015    Priority: High  . Hyperlipidemia 02/27/2015    Priority: High  . h/o elevated LFT's- dx of Fatty liver disease, nonalcoholic 02/27/2018    Priority: Medium  . Thoracic aortic aneurysm (HCC) 01/07/2016    Priority: Medium  . Elevated LFTs 03/07/2012    Priority: Medium  . Sleep apnea 03/07/2012    Priority: Medium  . S/P knee replacement 04/24/2016    Priority: Low  . Small bowel obstruction (HCC)     Priority: Low  . Leukocytosis 02/27/2015    Priority: Low  . Elevated triglycerides with high cholesterol 03/18/2018  . Elevated LDL cholesterol level 03/18/2018  . History of hyperlipidemia, mixed 02/27/2018  . Primary osteoarthritis of left knee 07/30/2017  . 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 S/P left TKA 04/24/2016  . Midsternal chest pain 11/14/2015  . Essential hypertension 11/14/2015  . Lactic acidosis 11/14/2015  . Dehydration 11/14/2015  . Positive D dimer   . Abdominal distension   . Chest pain 11/11/2015  . SBO (small bowel obstruction) (HCC) 02/27/2015  .  Enteritis 02/27/2015  . Abdominal pain, periumbilical 02/27/2015  . Nausea and vomiting 02/27/2015  . GI problem 03/07/2012     Past Medical History:  Diagnosis Date  . Arthritis   . Ascending aortic aneurysm (HCC)    a. 10/2015 CTA chest: 4.4cm aneurysmal dil of Asc Ao-->rec f/u in 1 yr.  [name] GERD (gastroesophageal reflux disease)  hx  . History of kidney stones   . Hyperlipidemia   . Hypertension   . Pneumonia    hx  . SBO (small bowel obstruction) (HCC)    a. 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  [name]. Sleep apnea    cpap     Past Surgical History:  Procedure Laterality Date  . HERNIA REPAIR Left    inguinal, and umbilical  . JOINT REPLACEMENT    . REVISION TOTAL KNEE ARTHROPLASTY Left 07/30/2017   LEFT TOTAL KNEE PARTIAL REVISION AND SYNOVECTOMY/notes 07/30/2017  . TOTAL KNEE ARTHROPLASTY Left 04/24/2016   Procedure: LEFT TOTAL KNEE ARTHROPLASTY;  Surgeon: Durene RomansMatthew Olin, MD;  Location: [facility];  Service: Orthopedics;  Laterality: Left;  . TOTAL KNEE REVISION Left 07/30/2017   Procedure: LEFT TOTAL KNEE PARTIAL REVISION AND SYNOVECTOMY;  Surgeon: Gean Birchwoodowan, Frank, MD;  Location: [facility] OR;  Service: Orthopedics;  Laterality: Left;     Family History  Problem Relation Age of Onset  . Stroke Mother   . Heart disease Mother   . Heart attack Maternal Grandmother        41>60s of age  . Hypertension Father   . Diabetes Father   . Cancer Father        bone  . Hyperlipidemia Father      Social History   Substance and Sexual Activity  Drug Use No  ,  Social History   Substance and Sexual Activity  Alcohol Use No  ,  Social History   Tobacco Use  Smoking Status Former Smoker  Smokeless Tobacco Never Used  Tobacco Comment   off and on as a teenager - very little per patient   ,    Current Outpatient Medications on File Prior to Visit  Medication Sig Dispense Refill  . amLODipine (NORVASC) 10 MG tablet Take 1 tablet (10 mg total) by mouth daily. NEED OV. 30 tablet 1  . meloxicam  (MOBIC) 15 MG tablet Take 1 tablet by mouth daily.  1  . Multiple Vitamins-Minerals (MULTIVITAMIN WITH MINERALS) tablet Take 1 tablet by mouth daily.    . Omega-3 Fatty Acids (FISH OIL) 1200 MG CAPS Take 1,200 mg by mouth daily.     No current facility-administered medications on file prior to visit.      No Known Allergies   Review of Systems:   General:  Denies fever, chills Optho/Auditory:   Denies visual changes, blurred vision Respiratory:   Denies SOB, cough, wheeze, DIB  Cardiovascular:   Denies chest pain, palpitations, painful respirations Gastrointestinal:   Denies nausea, vomiting, diarrhea.  Endocrine:     Denies new hot or cold intolerance Musculoskeletal:  Denies joint swelling, gait issues, or new unexplained myalgias/ arthralgias Skin:  Denies rash, suspicious lesions  Neurological:    Denies dizziness, unexplained weakness, numbness  Psychiatric/Behavioral:   Denies mood changes  Objective:    Blood pressure 139/90, pulse 69, height 5\' 11"  (1.803 m), weight 279 lb 9.6 oz (126.8 kg), SpO2 98 %.  Body mass index is 39 kg/m.  General: Well Developed, well nourished, and in no acute distress.  HEENT: Normocephalic, atraumatic, pupils equal round reactive to light, neck supple, No carotid bruits, no JVD Skin: Warm and dry, cap RF less 2 sec Cardiac: Regular rate and rhythm, S1, S2 WNL's, no murmurs rubs or gallops Respiratory: ECTA B/L, Not using accessory muscles, speaking in full sentences. NeuroM-Sk: Ambulates w/o assistance, moves ext * 4 w/o difficulty, sensation grossly intact.  Ext: scant edema b/l lower ext Psych: No  HI/SI, judgement and insight good, Euthymic mood. Full Affect.

## 2018-05-09 NOTE — Patient Instructions (Addendum)
( Melissa, please place order for CMP in 6-8 wks-->  HLD, HTN )   - f/up with me in 3 to 4 months for recheck of your blood pressure, see how you are tolerating the different blood pressure med as well as lower dose of cholesterol meds.  At that time please come fasting in case we want to get some blood work then   Look into Omron arm blood pressure cuff for home monitoring.   Guidelines for a Low Cholesterol, Low Saturated Fat Diet   Fats - Limit total intake of fats and oils. - Avoid butter, stick margarine, shortening, lard, palm and coconut oils. - Limit mayonnaise, salad dressings, gravies and sauces, unless they are homemade with low-fat ingredients. - Limit chocolate. - Choose low-fat and nonfat products, such as low-fat mayonnaise, low-fat or non-hydrogenated peanut butter, low-fat or fat-free salad dressings and nonfat gravy. - Use vegetable oil, such as canola or olive oil. - Look for margarine that does not contain trans fatty acids. - Use nuts in moderate amounts. - Read ingredient labels carefully to determine both amount and type of fat present in foods. Limit saturated and trans fats! - Avoid high-fat processed and convenience foods.  Meats and Meat Alternatives - Choose fish, chicken, [place] and lean meats. - Use dried beans, peas, lentils and tofu. - Limit egg yolks to three to four per week. - If you eat red meat, limit to no more than three servings per week and choose loin or round cuts. - Avoid fatty meats, such as bacon, sausage, franks, luncheon meats and ribs. - Avoid all organ meats, including liver.  Dairy - Choose nonfat or low-fat milk, yogurt and cottage cheese. - Most cheeses are high in fat. Choose cheeses made from non-fat milk, such as mozzarella and ricotta cheese. - Choose light or fat-free cream cheese and sour cream. - Avoid cream and sauces made with cream.  Fruits and Vegetables - Eat a wide variety of fruits and vegetables. - Use lemon  juice, vinegar or "mist" olive oil on vegetables. - Avoid adding sauces, fat or oil to vegetables.  Breads, Cereals and Grains - Choose whole-grain breads, cereals, pastas and rice. - Avoid high-fat snack foods, such as granola, cookies, pies, pastries, doughnuts and croissants.  Cooking Tips - Avoid deep fried foods. - Trim visible fat off meats and remove skin from poultry before cooking. - Bake, broil, boil, poach or roast poultry, fish and lean meats. - Drain and discard fat that drains out of meat as you cook it. - Add little or no fat to foods. - Use vegetable oil sprays to grease pans for cooking or baking. - Steam vegetables. - Use herbs or no-oil marinades to flavor foods.   Please realize, EXERCISE IS MEDICINE!  -  American Heart Association [facility]) guidelines for exercise : If you are in good health, without any medical conditions, you should engage in 150 minutes of moderate intensity aerobic activity per week.  This means you should be huffing and puffing throughout your workout.   Engaging in regular exercise will improve brain function and memory, as well as improve mood, boost immune system and help with weight management.  As well as the other, more well-known effects of exercise such as decreasing blood sugar levels, decreasing blood pressure,  and decreasing bad cholesterol levels/ increasing good cholesterol levels.     -  The AHA strongly endorses consumption of a diet that contains a variety of foods from all the  food categories with an emphasis on fruits and vegetables; fat-free and low-fat dairy products; cereal and grain products; legumes and nuts; and fish, poultry, and/or extra lean meats.    Excessive food intake, especially of foods high in saturated and trans fats, sugar, and salt, should be avoided.    Adequate water intake of roughly 1/2 of your weight in pounds, should equal the ounces of water per day you should drink.  So for instance, if you're 200 pounds,  that would be 100 ounces of water per day.         Mediterranean Diet  Why follow it? Research shows  Those who follow the Mediterranean diet have a reduced risk of heart disease   The diet is associated with a reduced incidence of Parkinson's and Alzheimer's diseases  People following the diet may have longer life expectancies and lower rates of chronic diseases   The Dietary Guidelines for Americans recommends the Mediterranean diet as an eating plan to promote health and prevent disease  What Is the Mediterranean Diet?   Healthy eating plan based on typical foods and recipes of Mediterranean-style cooking  The diet is primarily a plant based diet; these foods should make up a majority of meals   Starches - Plant based foods should make up a majority of meals - They are an important sources of vitamins, minerals, energy, antioxidants, and fiber - Choose whole grains, foods high in fiber and minimally processed items  - Typical grain sources include wheat, oats, barley, corn, brown rice, bulgar, farro, millet, polenta, couscous  - Various types of beans include chickpeas, lentils, fava beans, black beans, white beans   Fruits  Veggies - Large quantities of antioxidant rich fruits & veggies; 6 or more servings  - Vegetables can be eaten raw or lightly drizzled with oil and cooked  - Vegetables common to the traditional Mediterranean Diet include: artichokes, arugula, beets, broccoli, brussel sprouts, cabbage, carrots, celery, collard greens, cucumbers, eggplant, kale, leeks, lemons, lettuce, mushrooms, okra, onions, peas, peppers, potatoes, pumpkin, radishes, rutabaga, shallots, spinach, sweet potatoes, turnips, zucchini - Fruits common to the Mediterranean Diet include: apples, apricots, avocados, cherries, clementines, dates, figs, grapefruits, grapes, melons, nectarines, oranges, peaches, pears, pomegranates, strawberries, tangerines  Fats - Replace butter and margarine with  healthy oils, such as olive oil, canola oil, and tahini  - Limit nuts to no more than a handful a day  - Nuts include walnuts, almonds, pecans, pistachios, pine nuts  - Limit or avoid candied, honey roasted or heavily salted nuts - Olives are central to the [employer] - can be eaten whole or used in a variety of dishes   Meats Protein - Limiting red meat: no more than a few times a month - When eating red meat: choose lean cuts and keep the portion to the size of deck of cards - Eggs: approx. 0 to 4 times a week  - Fish and lean poultry: at least 2 a week  - Healthy protein sources include, chicken, [place], lean beef, lamb - Increase intake of seafood such as tuna, salmon, trout, mackerel, shrimp, scallops - Avoid or limit high fat processed meats such as sausage and bacon  Dairy - Include moderate amounts of low fat dairy products  - Focus on healthy dairy such as fat free yogurt, skim milk, low or reduced fat cheese - Limit dairy products higher in fat such as whole or 2% milk, cheese, ice cream  Alcohol - Moderate amounts of red wine  is ok  - No more than 5 oz daily for women (all ages) and men older than age 56  - No more than 10 oz of wine daily for men younger than 1565  Other - Limit sweets and other desserts  - Use herbs and spices instead of salt to flavor foods  - Herbs and spices common to the traditional Mediterranean Diet include: basil, bay leaves, chives, cloves, cumin, fennel, garlic, lavender, marjoram, mint, oregano, parsley, pepper, rosemary, sage, savory, sumac, tarragon, thyme   Its not just a diet, its a lifestyle:   The Mediterranean diet includes lifestyle factors typical of those in the region   Foods, drinks and meals are best eaten with others and savored  Daily physical activity is important for overall good health  This could be strenuous exercise like running and aerobics  This could also be more leisurely activities such as walking, housework,  yard-work, or taking the stairs  Moderation is the key; a balanced and healthy diet accommodates most foods and drinks  Consider portion sizes and frequency of consumption of certain foods   Meal Ideas & Options:   Breakfast:  o Whole wheat toast or whole wheat English muffins with peanut butter & hard boiled egg o Steel cut oats topped with apples & cinnamon and skim milk  o Fresh fruit: banana, strawberries, melon, berries, peaches  o Smoothies: strawberries, bananas, greek yogurt, peanut butter o Low fat greek yogurt with blueberries and granola  o Egg white omelet with spinach and mushrooms o Breakfast couscous: whole wheat couscous, apricots, skim milk, cranberries   Sandwiches:  o Hummus and grilled vegetables (peppers, zucchini, squash) on whole wheat bread   o Grilled chicken on whole wheat pita with lettuce, tomatoes, cucumbers or tzatziki  o Tuna salad on whole wheat bread: tuna salad made with greek yogurt, olives, red peppers, capers, green onions o Garlic rosemary lamb pita: lamb sauted with garlic, rosemary, salt & pepper; add lettuce, cucumber, greek yogurt to pita - flavor with lemon juice and black pepper   Seafood:  o Mediterranean grilled salmon, seasoned with garlic, basil, parsley, lemon juice and black pepper o Shrimp, lemon, and spinach whole-grain pasta salad made with low fat greek yogurt  o Seared scallops with lemon orzo  o Seared tuna steaks seasoned salt, pepper, coriander topped with tomato mixture of olives, tomatoes, olive oil, minced garlic, parsley, green onions and cappers   Meats:  o Herbed greek chicken salad with kalamata olives, cucumber, feta  o Red bell peppers stuffed with spinach, bulgur, lean ground beef (or lentils) & topped with feta   o Kebabs: skewers of chicken, tomatoes, onions, zucchini, squash  o [place] burgers: made with red onions, mint, dill, lemon juice, feta cheese topped with roasted red peppers  Vegetarian o Cucumber  salad: cucumbers, artichoke hearts, celery, red onion, feta cheese, tossed in olive oil & lemon juice  o Hummus and whole grain pita points with a greek salad (lettuce, tomato, feta, olives, cucumbers, red onion) o Lentil soup with celery, carrots made with vegetable broth, garlic, salt and pepper  o Tabouli salad: parsley, bulgur, mint, scallions, cucumbers, tomato, radishes, lemon juice, olive oil, salt and pepper.

## 2018-06-27 ENCOUNTER — Other Ambulatory Visit: Payer: Self-pay | Admitting: Cardiovascular Disease

## 2018-06-28 NOTE — Telephone Encounter (Signed)
Rx request sent to pharmacy.  

## 2018-07-09 ENCOUNTER — Other Ambulatory Visit (INDEPENDENT_AMBULATORY_CARE_PROVIDER_SITE_OTHER): Payer: BLUE CROSS/BLUE SHIELD

## 2018-07-09 ENCOUNTER — Encounter: Payer: Self-pay | Admitting: Family Medicine

## 2018-07-09 DIAGNOSIS — I10 Essential (primary) hypertension: Secondary | ICD-10-CM

## 2018-07-09 NOTE — Progress Notes (Signed)
Per Leda Min, pt only needs CMP drawn today.  Tiajuana Amass, CMA

## 2018-07-10 LAB — COMPREHENSIVE METABOLIC PANEL
ALT: 104 IU/L — ABNORMAL HIGH (ref 0–44)
AST: 75 IU/L — ABNORMAL HIGH (ref 0–40)
Albumin/Globulin Ratio: 1.4 (ref 1.2–2.2)
Albumin: 4.2 g/dL (ref 3.5–5.5)
Alkaline Phosphatase: 71 IU/L (ref 39–117)
BUN/Creatinine Ratio: 17 (ref 9–20)
BUN: 15 mg/dL (ref 6–24)
Bilirubin Total: 1 mg/dL (ref 0.0–1.2)
CO2: 26 mmol/L (ref 20–29)
Calcium: 9.1 mg/dL (ref 8.7–10.2)
Chloride: 101 mmol/L (ref 96–106)
Creatinine, Ser: 0.87 mg/dL (ref 0.76–1.27)
GFR calc Af Amer: 112 mL/min/{1.73_m2} (ref >59)
GFR calc non Af Amer: 96 mL/min/{1.73_m2} (ref >59)
Globulin, Total: 2.9 g/dL (ref 1.5–4.5)
Glucose: 81 mg/dL (ref 65–99)
Potassium: 3.6 mmol/L (ref 3.5–5.2)
Sodium: 143 mmol/L (ref 134–144)
Total Protein: 7.1 g/dL (ref 6.0–8.5)

## 2018-09-01 ENCOUNTER — Other Ambulatory Visit: Payer: Self-pay | Admitting: Cardiovascular Disease

## 2018-10-09 ENCOUNTER — Ambulatory Visit: Payer: Self-pay | Admitting: Family Medicine

## 2018-10-09 ENCOUNTER — Other Ambulatory Visit: Payer: Self-pay | Admitting: Cardiovascular Disease

## 2018-10-11 ENCOUNTER — Other Ambulatory Visit: Payer: Self-pay

## 2018-10-11 ENCOUNTER — Observation Stay (HOSPITAL_BASED_OUTPATIENT_CLINIC_OR_DEPARTMENT_OTHER): Payer: BLUE CROSS/BLUE SHIELD

## 2018-10-11 ENCOUNTER — Observation Stay (HOSPITAL_COMMUNITY)
Admission: EM | Admit: 2018-10-11 | Discharge: 2018-10-12 | Disposition: A | Discharge status: Home / Self Care | Payer: BLUE CROSS/BLUE SHIELD | Attending: Internal Medicine | Admitting: Internal Medicine

## 2018-10-11 ENCOUNTER — Emergency Department (HOSPITAL_COMMUNITY): Payer: BLUE CROSS/BLUE SHIELD

## 2018-10-11 ENCOUNTER — Encounter (HOSPITAL_COMMUNITY): Payer: Self-pay | Admitting: Emergency Medicine

## 2018-10-11 DIAGNOSIS — G4730 Sleep apnea, unspecified: Secondary | ICD-10-CM | POA: Insufficient documentation

## 2018-10-11 DIAGNOSIS — I712 Thoracic aortic aneurysm, without rupture, unspecified: Secondary | ICD-10-CM

## 2018-10-11 DIAGNOSIS — R0789 Other chest pain: Secondary | ICD-10-CM | POA: Diagnosis not present

## 2018-10-11 DIAGNOSIS — E669 Obesity, unspecified: Secondary | ICD-10-CM | POA: Insufficient documentation

## 2018-10-11 DIAGNOSIS — R079 Chest pain, unspecified: Secondary | ICD-10-CM

## 2018-10-11 DIAGNOSIS — I10 Essential (primary) hypertension: Secondary | ICD-10-CM | POA: Diagnosis not present

## 2018-10-11 DIAGNOSIS — R110 Nausea: Secondary | ICD-10-CM | POA: Diagnosis not present

## 2018-10-11 DIAGNOSIS — Z96652 Presence of left artificial knee joint: Secondary | ICD-10-CM | POA: Insufficient documentation

## 2018-10-11 DIAGNOSIS — E785 Hyperlipidemia, unspecified: Secondary | ICD-10-CM | POA: Diagnosis not present

## 2018-10-11 DIAGNOSIS — Z87891 Personal history of nicotine dependence: Secondary | ICD-10-CM | POA: Insufficient documentation

## 2018-10-11 DIAGNOSIS — I719 Aortic aneurysm of unspecified site, without rupture: Secondary | ICD-10-CM | POA: Diagnosis not present

## 2018-10-11 DIAGNOSIS — R0602 Shortness of breath: Secondary | ICD-10-CM | POA: Diagnosis not present

## 2018-10-11 HISTORY — DX: Chest pain, unspecified: R07.9

## 2018-10-11 LAB — ECHOCARDIOGRAM COMPLETE
Height: 70 in
Weight: 4621.55 oz

## 2018-10-11 LAB — CBC
HCT: 42.2 % (ref 39.0–52.0)
Hemoglobin: 14.3 g/dL (ref 13.0–17.0)
MCH: 31.1 pg (ref 26.0–34.0)
MCHC: 33.9 g/dL (ref 30.0–36.0)
MCV: 91.7 fL (ref 80.0–100.0)
Platelets: 145 10*3/uL — ABNORMAL LOW (ref 150–400)
RBC: 4.6 MIL/uL (ref 4.22–5.81)
RDW: 12.2 % (ref 11.5–15.5)
WBC: 6.9 10*3/uL (ref 4.0–10.5)
nRBC: 0 % (ref 0.0–0.2)

## 2018-10-11 LAB — BASIC METABOLIC PANEL
Anion gap: 12 (ref 5–15)
BUN: 20 mg/dL (ref 6–20)
CO2: 25 mmol/L (ref 22–32)
Calcium: 8.6 mg/dL — ABNORMAL LOW (ref 8.9–10.3)
Chloride: 102 mmol/L (ref 98–111)
Creatinine, Ser: 1.22 mg/dL (ref 0.61–1.24)
GFR calc Af Amer: >60 mL/min (ref >60)
GFR calc non Af Amer: >60 mL/min (ref >60)
Glucose, Bld: 101 mg/dL — ABNORMAL HIGH (ref 70–99)
Potassium: 3.5 mmol/L (ref 3.5–5.1)
Sodium: 139 mmol/L (ref 135–145)

## 2018-10-11 LAB — I-STAT TROPONIN, ED
Troponin i, poc: 0.01 ng/mL (ref 0.00–0.08)
Troponin i, poc: 0.01 ng/mL (ref 0.00–0.08)

## 2018-10-11 LAB — TROPONIN I
Troponin I: <0.03 ng/mL (ref <0.03)
Troponin I: <0.03 ng/mL (ref <0.03)

## 2018-10-11 MED ORDER — ASPIRIN EC 81 MG PO TBEC
81.0000 mg | DELAYED_RELEASE_TABLET | Freq: Every day | ORAL | Status: DC
  Administered 2018-10-12: 81 mg via ORAL
  Filled 2018-10-11: qty 1

## 2018-10-11 MED ORDER — HYDROCHLOROTHIAZIDE 12.5 MG PO CAPS
12.5000 mg | ORAL_CAPSULE | Freq: Every day | ORAL | Status: DC
  Administered 2018-10-12: 12.5 mg via ORAL
  Filled 2018-10-11: qty 1

## 2018-10-11 MED ORDER — AMLODIPINE BESYLATE 10 MG PO TABS
10.0000 mg | ORAL_TABLET | Freq: Every day | ORAL | Status: DC
  Administered 2018-10-12: 10 mg via ORAL
  Filled 2018-10-11: qty 1

## 2018-10-11 MED ORDER — ALPRAZOLAM 0.25 MG PO TABS: 0.2500 mg | ORAL_TABLET | Freq: Two times a day (BID) | ORAL | Status: DC | PRN

## 2018-10-11 MED ORDER — ADULT MULTIVITAMIN W/MINERALS CH
1.0000 | ORAL_TABLET | Freq: Every day | ORAL | Status: DC
  Administered 2018-10-12: 1 via ORAL
  Filled 2018-10-11: qty 1

## 2018-10-11 MED ORDER — DOCUSATE SODIUM 100 MG PO CAPS
200.0000 mg | ORAL_CAPSULE | Freq: Every day | ORAL | Status: DC
  Administered 2018-10-11: 200 mg via ORAL
  Filled 2018-10-11: qty 2

## 2018-10-11 MED ORDER — ACETAMINOPHEN 325 MG PO TABS: 650.0000 mg | ORAL_TABLET | ORAL | Status: DC | PRN

## 2018-10-11 MED ORDER — CARVEDILOL 6.25 MG PO TABS
6.2500 mg | ORAL_TABLET | Freq: Two times a day (BID) | ORAL | Status: DC
  Administered 2018-10-11 – 2018-10-12 (×2): 6.25 mg via ORAL
  Filled 2018-10-11 (×2): qty 1

## 2018-10-11 MED ORDER — DOCUSATE SODIUM 100 MG PO CAPS
300.0000 mg | ORAL_CAPSULE | Freq: Every day | ORAL | Status: DC
  Administered 2018-10-12: 300 mg via ORAL
  Filled 2018-10-11: qty 3

## 2018-10-11 MED ORDER — LOSARTAN POTASSIUM-HCTZ 100-12.5 MG PO TABS: 1.0000 | ORAL_TABLET | Freq: Every day | ORAL | Status: DC

## 2018-10-11 MED ORDER — ATORVASTATIN CALCIUM 10 MG PO TABS
20.0000 mg | ORAL_TABLET | Freq: Every day | ORAL | Status: DC
  Administered 2018-10-11: 20 mg via ORAL
  Filled 2018-10-11: qty 2

## 2018-10-11 MED ORDER — ONDANSETRON HCL 4 MG/2ML IJ SOLN: 4.0000 mg | Freq: Four times a day (QID) | INTRAMUSCULAR | Status: DC | PRN

## 2018-10-11 MED ORDER — ONDANSETRON 4 MG PO TBDP
8.0000 mg | ORAL_TABLET | Freq: Once | ORAL | Status: AC
  Administered 2018-10-11: 8 mg via ORAL
  Filled 2018-10-11: qty 2

## 2018-10-11 MED ORDER — ASPIRIN 81 MG PO CHEW
324.0000 mg | CHEWABLE_TABLET | Freq: Once | ORAL | Status: AC
  Administered 2018-10-11: 324 mg via ORAL
  Filled 2018-10-11: qty 4

## 2018-10-11 MED ORDER — IOPAMIDOL (ISOVUE-370) INJECTION 76%
100.0000 mL | Freq: Once | INTRAVENOUS | Status: AC | PRN
  Administered 2018-10-11: 100 mL via INTRAVENOUS

## 2018-10-11 MED ORDER — MORPHINE SULFATE (PF) 2 MG/ML IV SOLN: 2.0000 mg | INTRAVENOUS | Status: DC | PRN

## 2018-10-11 MED ORDER — MORPHINE SULFATE (PF) 4 MG/ML IV SOLN
4.0000 mg | Freq: Once | INTRAVENOUS | Status: AC
  Administered 2018-10-11: 4 mg via INTRAVENOUS
  Filled 2018-10-11: qty 1

## 2018-10-11 MED ORDER — ENOXAPARIN SODIUM 40 MG/0.4ML ~~LOC~~ SOLN
40.0000 mg | SUBCUTANEOUS | Status: DC
  Administered 2018-10-11: 40 mg via SUBCUTANEOUS
  Filled 2018-10-11 (×2): qty 0.4

## 2018-10-11 MED ORDER — LOSARTAN POTASSIUM 50 MG PO TABS
100.0000 mg | ORAL_TABLET | Freq: Every day | ORAL | Status: DC
  Administered 2018-10-12: 100 mg via ORAL
  Filled 2018-10-11: qty 2

## 2018-10-11 MED ORDER — OMEGA-3-ACID ETHYL ESTERS 1 G PO CAPS
2.0000 g | ORAL_CAPSULE | Freq: Every day | ORAL | Status: DC
  Administered 2018-10-12: 2 g via ORAL
  Filled 2018-10-11: qty 2

## 2018-10-11 NOTE — ED Triage Notes (Signed)
Patient presents c/o sharp tightness to center/right of chest onset about 30 minutes ago. Patient reports history of aneurysm. States pain radiated into right side of neck.

## 2018-10-11 NOTE — Progress Notes (Signed)
  Echocardiogram 2D Echocardiogram has been performed.  Delcie Roch 10/11/2018, 5:35 PM

## 2018-10-11 NOTE — H&P (Signed)
History and Physical    Keith CockingRoy A Whitmyer ZOX:[id number] DOB: Apr 08, 1961 DOA: 10/11/2018  PCP: Thomasene Lotpalski, Deborah, DO   Patient coming from: Home  Chief Complaint: Chest Pain  HPI: Keith Vance is a 58 y.o. male with medical history significant of a 4.4 cm ascending aortic aneurysm, GERD, hypertension, hyperlipidemia history of nephrolithiasis, history of pneumonia, small bowel obstruction, history of sleep apnea and other comorbidities who presents with a chief complaint of chest pain.  Patient states that this morning he was at work and around 9:45 AM he was walking when he developed sudden chest pain.  He states it is sharp that radiated to his chest and then to the right side.  He states that is an 8 out of 10 in severity and that he started feeling chest tightness and got sweaty and felt a heaviness.  He admitted to having some nausea but no vomiting.  Admitted to having some lightheadedness and dizziness as states he was very sweaty.  States the incidence lasted about 15 minutes and then subsequently resolved but because of this has happened in the past before he is concerned that his aortic aneurysm may rupture so he presented to the emergency room for further evaluation recommendations.  Patient had a similar episode last week but that resolved after a few minutes.  He no longer smokes and has risk factors.  TRH was called admit this patient for chest pain rule out ACS. Cardiology was consulted for further evaluation and recommendations.  ED Course: In the ED patient was given nitroglycerin, aspirin, and had a CT of the chest done.  He also had basic blood work and troponin done.   Review of Systems: As per HPI otherwise 10 point review of systems negative.   Past Medical History:  Diagnosis Date  . Arthritis   . Ascending aortic aneurysm (HCC)    a. 10/2015 CTA chest: 4.4cm aneurysmal dil of Asc Ao-->rec f/u in 1 yr.  . Chest pain 10/11/2018  . GERD (gastroesophageal reflux disease)    hx   . History of kidney stones   . Hyperlipidemia   . Hypertension   . Pneumonia    hx  . SBO (small bowel obstruction) (HCC)    a. 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  [name]. Sleep apnea    cpap   Past Surgical History:  Procedure Laterality Date  . HERNIA REPAIR Left    inguinal, and umbilical  . JOINT REPLACEMENT    . REVISION TOTAL KNEE ARTHROPLASTY Left 07/30/2017   LEFT TOTAL KNEE PARTIAL REVISION AND SYNOVECTOMY/notes 07/30/2017  . TOTAL KNEE ARTHROPLASTY Left 04/24/2016   Procedure: LEFT TOTAL KNEE ARTHROPLASTY;  Surgeon: Durene RomansMatthew Olin, MD;  Location: [facility];  Service: Orthopedics;  Laterality: Left;  . TOTAL KNEE REVISION Left 07/30/2017   Procedure: LEFT TOTAL KNEE PARTIAL REVISION AND SYNOVECTOMY;  Surgeon: Gean Birchwoodowan, Frank, MD;  Location: [facility];  Service: Orthopedics;  Laterality: Left;   SOCIAL HISTORY  reports that he has quit smoking. He has never used smokeless tobacco. He reports that he does not drink alcohol or use drugs.  ALLERGIES No Known Allergies  Family History  Problem Relation Age of Onset  . Stroke Mother   . Heart disease Mother   . Heart attack Maternal Grandmother        78>60s of age  . Hypertension Father   . Diabetes Father   . Cancer Father        bone  . Hyperlipidemia Father    Prior to  Admission medications   Medication Sig Start Date End Date Taking? Authorizing Provider  amLODipine (NORVASC) 10 MG tablet TAKE 1 TABLET BY MOUTH DAILY. NEED APPOINTMENT 10/09/18   Runell Gess, MD  atorvastatin (LIPITOR) 40 MG tablet Take 0.5 tablets (20 mg total) by mouth at bedtime. 05/09/18   Thomasene Lot, DO  carvedilol (COREG) 6.25 MG tablet 1 tab twice daily for BP 05/09/18   Opalski, Deborah, DO  losartan-hydrochlorothiazide (HYZAAR) 100-12.5 MG tablet Take 1 tablet by mouth daily. 05/09/18   Opalski, Gavin Pound, DO  meloxicam (MOBIC) 15 MG tablet Take 1 tablet by mouth daily. 02/21/18   [provider]  Multiple Vitamins-Minerals (MULTIVITAMIN WITH MINERALS) tablet  Take 1 tablet by mouth daily.    [provider]  Omega-3 Fatty Acids (FISH OIL) 1200 MG CAPS Take 1,200 mg by mouth daily.    [provider]   Physical Exam: Vitals:   10/11/18 1200 10/11/18 1230 10/11/18 1537 10/11/18 1617  BP: 134/90 (!) 142/89 (!) 143/90 (!) 170/121  Pulse: 63 66 64 71  Resp: 10 17 16 16   Temp:    (!) 97.4 F (36.3 C)  TempSrc:    Oral  SpO2: 96% 94% 96% 96%  Weight:    131 kg  Height:    5\' 10"  (1.778 m)   Constitutional: WN/WD morbidly obese Caucasian male in NAD and appears calm and comfortable Eyes: Lids and conjunctivae normal, sclerae anicteric  ENMT: External Ears, Nose appear normal. Grossly normal hearing. Mucous membranes are moist. Neck: Appears normal, supple, no cervical masses, normal ROM, no appreciable thyromegaly Respiratory: Clear to auscultation bilaterally, no wheezing, rales, rhonchi or crackles. Normal respiratory effort and patient is not tachypenic. No accessory muscle use.  Cardiovascular: RRR, no murmurs / rubs / gallops. S1 and S2 auscultated. Trace to 1+ LE extremity edema.  Abdomen: Soft, non-tender, Distended 2/2 body habitus. No masses palpated. No appreciable hepatosplenomegaly. Bowel sounds positive.  GU: Deferred. Musculoskeletal: No clubbing / cyanosis of digits/nails. No joint deformity upper and lower extremities.  Skin: No rashes, lesions, ulcers on a limited skin evaluation. No induration; Warm and dry.  Neurologic: CN 2-12 grossly intact with no focal deficits.  Romberg sign and cerebellar reflexes not assessed.  Psychiatric: Normal judgment and insight. Alert and oriented x 3. Normal mood and appropriate affect.   Labs on Admission: I have personally reviewed following labs and imaging studies  CBC: Recent Labs  Lab 10/11/18 1022  WBC 6.9  HGB 14.3  HCT 42.2  MCV 91.7  PLT 145*   Basic Metabolic Panel: Recent Labs  Lab 10/11/18 1022  NA 139  K 3.5  CL 102  CO2 25  GLUCOSE 101*  BUN 20    CREATININE 1.22  CALCIUM 8.6*   GFR: Estimated Creatinine Clearance: 92 mL/min (by C-G formula based on SCr of 1.22 mg/dL). Liver Function Tests: No results for input(s): AST, ALT, ALKPHOS, BILITOT, PROT, ALBUMIN in the last 168 hours. No results for input(s): LIPASE, AMYLASE in the last 168 hours. No results for input(s): AMMONIA in the last 168 hours. Coagulation Profile: No results for input(s): INR, PROTIME in the last 168 hours. Cardiac Enzymes: No results for input(s): CKTOTAL, CKMB, CKMBINDEX, TROPONINI in the last 168 hours. BNP (last 3 results) No results for input(s): PROBNP in the last 8760 hours. HbA1C: No results for input(s): HGBA1C in the last 72 hours. CBG: No results for input(s): GLUCAP in the last 168 hours. Lipid Profile: No results for input(s): CHOL,  HDL, LDLCALC, TRIG, CHOLHDL, LDLDIRECT in the last 72 hours. Thyroid Function Tests: No results for input(s): TSH, T4TOTAL, FREET4, T3FREE, THYROIDAB in the last 72 hours. Anemia Panel: No results for input(s): VITAMINB12, FOLATE, FERRITIN, TIBC, IRON, RETICCTPCT in the last 72 hours. Urine analysis:    Component Value Date/Time   COLORURINE YELLOW 07/25/2017 1443   APPEARANCEUR CLEAR 07/25/2017 1443   LABSPEC 1.019 07/25/2017 1443   PHURINE 7.0 07/25/2017 1443   GLUCOSEU NEGATIVE 07/25/2017 1443   HGBUR NEGATIVE 07/25/2017 1443   BILIRUBINUR NEGATIVE 07/25/2017 1443   KETONESUR NEGATIVE 07/25/2017 1443   PROTEINUR NEGATIVE 07/25/2017 1443   UROBILINOGEN 0.2 02/27/2015 0450   NITRITE NEGATIVE 07/25/2017 1443   LEUKOCYTESUR NEGATIVE 07/25/2017 1443   Sepsis Labs: !!!!!!!!!!!!!!!!!!!!!!!!!!!!!!!!!!!!!!!!!!!! @LABRCNTIP (procalcitonin:4,lacticidven:4) )No results found for this or any previous visit (from the past 240 hour(s)).   Radiological Exams on Admission: Dg Chest Port 1 View  Result Date: 10/11/2018 CLINICAL DATA:  Chest pain. EXAM: PORTABLE CHEST 1 VIEW COMPARISON:  Portable chest 11/12/2015.   Chest CT 02/20/2017. FINDINGS: 1303 hours. The heart size is stable at the upper limits of normal for portable technique. The mediastinal contours are normal. The lungs are clear. There is no pleural effusion or pneumothorax. No acute osseous findings are seen. IMPRESSION: Stable chest.  No active cardiopulmonary process. Electronically Signed   By: Carey Bullocks M.D.   On: 10/11/2018 13:33   Ct Angio Chest/abd/pel For Dissection W And/or W/wo  Result Date: 10/11/2018 CLINICAL DATA:  58 year old male with central chest pain radiating into the right neck and a reported personal history of aneurysm. EXAM: CT ANGIOGRAPHY CHEST, ABDOMEN AND PELVIS TECHNIQUE: Multidetector CT imaging through the chest, abdomen and pelvis was performed using the standard protocol during bolus administration of intravenous contrast. Multiplanar reconstructed images and MIPs were obtained and reviewed to evaluate the vascular anatomy. CONTRAST:  ISOVUE-370 IOPAMIDOL (ISOVUE-370) INJECTION 76% COMPARISON:  Prior CT scan of the chest 02/20/2017; prior CT scan of the abdomen and pelvis 11/12/2015 FINDINGS: CTA CHEST FINDINGS Cardiovascular: Initial unenhanced CT imaging demonstrates no evidence of acute intramural hematoma. Following administration of intravenous contrast, there is excellent opacification of the thoracic aorta. Two vessel arch anatomy. The right brachiocephalic and left common carotid artery share a common origin. No evidence of aortic dissection or abnormality of the arch vessels. Mild fusiform aneurysmal dilatation of the ascending thoracic aorta with a maximal diameter of 4.4 cm compared to 02/20/2017. The main pulmonary artery is normal in size. No evidence of central pulmonary embolus. Cardiomegaly with right heart enlargement. No pericardial effusion. Mediastinum/Nodes: No acute fracture or aggressive appearing lytic or blastic osseous lesion. Lungs/Pleura: Mild dependent atelectasis. No pneumothorax or  pleural effusion. No suspicious pulmonary nodule or mass. Musculoskeletal: No acute fracture or aggressive appearing lytic or blastic osseous lesion. Review of the MIP images confirms the above findings. CTA ABDOMEN AND PELVIS FINDINGS VASCULAR Aorta: Normal caliber aorta without aneurysm, dissection, vasculitis or significant stenosis. 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 No evidence of dissection or fibromuscular dysplasia. SMA: Patent without evidence of aneurysm, dissection, vasculitis or significant stenosis. Renals: Both renal arteries are patent without evidence of aneurysm, dissection, vasculitis, fibromuscular dysplasia or significant stenosis. IMA: Patent without evidence of aneurysm, dissection, vasculitis or significant stenosis. Inflow: Patent without evidence of aneurysm, dissection, vasculitis or significant stenosis. Veins: No obvious venous abnormality within the limitations of this arterial phase study. Review of the MIP  images confirms the above findings. NON-VASCULAR Hepatobiliary: Normal hepatic contour and morphology. No discrete hepatic lesions. Normal appearance of the gallbladder. No intra or extrahepatic biliary ductal dilatation. Pancreas: Unremarkable. No pancreatic ductal dilatation or surrounding inflammatory changes. Spleen: Normal in size without focal abnormality. Adrenals/Urinary Tract: 7.5 mm stone in the lower pole of the left kidney without evidence of obstruction. More punctate 3 mm stone in the interpolar right renal collecting system. No evidence of hydronephrosis or enhancing renal mass. Unremarkable ureters and bladder. Stomach/Bowel: No evidence of obstruction or focal bowel wall thickening. Normal appendix in the right lower quadrant. The terminal ileum is unremarkable. Lymphatic: Prominent upper abdominal portal caval, and gastrohepatic ligament lymph nodes demonstrate no significant interval  change dating back to 2017. The nodes are likely chronic/reactive. Reproductive: Prostate is unremarkable. Other: No abdominal wall hernia or abnormality. No abdominopelvic ascites. Musculoskeletal: No acute fracture or aggressive appearing lytic or blastic osseous lesion. Review of the MIP images confirms the above findings. IMPRESSION: 1. Stable (compared to 02/20/2017) aneurysmal dilatation of the ascending thoracic aorta with a maximal diameter of 4.4 cm. Recommend annual imaging followup by CTA or MRA. This recommendation follows 2010 ACCF/AHA/AATS/ACR/ASA/SCA/SCAI/SIR/STS/SVM Guidelines for the Diagnosis and Management of Patients with Thoracic Aortic Disease. Circulation. 2010; 121: Z610-R604. Aortic aneurysm NOS (ICD10-I71.9). 2. 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 3. Cardiomegaly with right heart enlargement. 4. Bilateral nonobstructing nephrolithiasis. 5. Stable prominent and likely reactive upper abdominal lymph nodes dating back to at least 2017. Electronically Signed   By: Malachy Moan M.D.   On: 10/11/2018 14:21   EKG: Independently reviewed. Showed Sinus Rhythm at a rate of with no evidence of ST Elevation on my interpretation.   Assessment/Plan Active Problems:   Hypertension   Hyperlipidemia   Chest pain   Midsternal chest pain   Thoracic aortic aneurysm [facility])   Sleep apnea   Morbid obesity (HCC)  Chest Pain r/o ACS -Place in observation telemetry -Cycle cardiac troponins -Obtain EKG in the a.m. -Obtain echocardiogram -Continue aspirin 81 mg p.o. daily.  Patient received 324 mg once in the ED -Continue with carvedilol 6.25 mg p.o. twice daily as well as lovastatin 20 mg p.o. daily and losartan 100 over p.o. daily -Cardiology consulted for further evaluation recommendations -Continue with IV morphine for pain control 2 mg every 2 PRN for severe pain -Check fasting lipid panel, TSH, as well as hemoglobin A1c as well as  magnesium and Foss level  OSA -C/w Nocturnal CPAP  Aortic Anuerysm -CTA showed Stable (compared to 02/20/2017) aneurysmal dilatation of the ascending thoracic aorta with a maximal diameter of 4.4 cm. 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  HLD -C/w Atorvastatin 20 mg po qHS -Check FLP in AM   Hypertension -Resume Home Antihypertensives and c/w Amlodipine 10 mg po Daily, Carvedilolol  6.25 mg po BID and HCTZ 12.5 mg po Daily  -Add IV Hydralazine for SBP>180 or DBP>100  Morbid Obesity -Estimated body mass index is 41.45 kg/m as calculated from the following:   Height as of this encounter: 5\' 10"  (1.778 m).   Weight as of this encounter: 131 kg. -Weight Loss and Dietary Counseling given  Thrombocytopenia -Mild at 145 -Continue to Monitor for S/Sx of Bleeding -Repeat CBC in AM   Hyperglycemia -Glucose on Admission was 101 -Likely reactive but will r/o Diabetes Mellitus and obtain HbA1c  Nephrolithiasis -  Non-obstructing and there is a 7.5 mm stone in the Lower pole of the Left Kidney and a punctate 3 mm stone in the interpolar Right Renal Collecting System  DVT prophylaxis: Enoxaparin 40 mg sq q24h Code Status: FULL CODE Family Communication: Discussed with Daughter at beside Disposition Plan: Anticipate D/C back to Home environment if rules out for ACS Consults called: Cardiology Dr. Jacques Navy Admission status: Obs Cardiac Telemetry  Severity of Illness: The appropriate patient status for this patient is OBSERVATION. Observation status is judged to be reasonable and necessary in order to provide the required intensity of service to ensure the patient's safety. The patient's presenting symptoms, physical exam findings, and initial radiographic and laboratory data in the context of their medical condition is felt to place them at decreased risk for further clinical deterioration.  Furthermore, it is anticipated that the patient will be medically stable for discharge from the hospital within 2 midnights of admission. The following factors support the patient status of observation.   " The patient's presenting symptoms include Chest Pain. " The physical exam findings include Trace LE Edema. " The initial radiographic and laboratory data are currently reassuring.  Merlene Laughter, D.O. [facility] PAGER is on AMION  If 7PM-7AM, please contact night-coverage [email or web address] Password [facility]  10/11/2018, 7:03 PM

## 2018-10-11 NOTE — ED Provider Notes (Signed)
Medical screening examination/treatment/procedure(s) were conducted as a shared visit with non-physician practitioner(s) and myself.  I personally evaluated the patient during the encounter. Briefly, the patient is a 58 y.o. male with history of high cholesterol, hypertension, aortic aneurysm who presents to the ED with chest pain.  Patient states pain occurred just prior to arriving.  Did radiate to the right side of his neck and was associated with diaphoresis and shortness of breath and nausea but no vomiting.  Patient with no abdominal tenderness.  Patient with good pulses throughout. Mild hypertension upon arrival.  Patient given morphine for pain.  EKG showed sinus rhythm with no new ischemic changes.  Troponin within normal limits.  No signs of volume overload on exam.  Patient with chest x-ray that showed no signs of pneumonia, pneumothorax, pleural effusion.  CT dissection study was ordered that was overall unremarkable.  Showed stable aneurysm.  Patient overall with high heart score and admitted to medicine service for further ACS rule out.  Patient otherwise had no significant anemia, electrolyte abnormality, kidney injury.  Patient admitted in stable condition.  This chart was dictated using voice recognition software.  Despite best efforts to proofread,  errors can occur which can change the documentation meaning.     EKG Interpretation  Date/Time:  Friday October 11 2018 10:08:35 EST Ventricular Rate:  68 PR Interval:    QRS Duration: 114 QT Interval:  416 QTC Calculation: 443 R Axis:   -35 Text Interpretation:  Sinus rhythm Incomplete RBBB and LAFB Confirmed by Virgina Norfolk [phone]) on 10/11/2018 10:12:32 AM Also confirmed by Virgina Norfolk [phone]), editor Elita Quick ([id number])  on 10/11/2018 2:03:39 PM           Virgina Norfolk, DO 10/11/18 1521

## 2018-10-11 NOTE — Progress Notes (Signed)
Pt is on NIV at this this time tolerating it well.

## 2018-10-11 NOTE — ED Provider Notes (Signed)
[facility]   CSN: [id number] Arrival date & time: 10/11/18  [id number]     History   Chief Complaint Chief Complaint  Patient presents with  . Chest Pain    HPI Keith Vance is a 58 y.o. male.  58 year old male with history of 4.4 cm thoracic aortic aneurysm last imaged in June 2018 presents with complaint of chest pain.  Patient states he was at work today walking when he had sudden onset of severe sharp midsternal pain and chest tightness just prior to arrival.  Pain radiates to the right side of his neck, associated with diaphoresis and shortness of breath, nausea no vomiting.  Patient states pain at onset 8 out of 10, currently 3 out of 10 however continues to feel very tight in his chest.  Patient reports similar but less severe episode of pain last week which resolved after a few minutes.  Patient is a former smoker, history of hypertension and high cholesterol, does not take aspirin daily, is compliant with blood pressure medications and did take his medication this morning.     Past Medical History:  Diagnosis Date  . Arthritis   . Ascending aortic aneurysm (HCC)    a. 10/2015 CTA chest: 4.4cm aneurysmal dil of Asc Ao-->rec f/u in 1 yr.  [name]. GERD (gastroesophageal reflux disease)    hx  . History of kidney stones   . Hyperlipidemia   . Hypertension   . Pneumonia    hx  . SBO (small bowel obstruction) (HCC)    a. 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  [name]. Sleep apnea    cpap    Patient Active Problem List   Diagnosis Date Noted  . NAFLD (nonalcoholic fatty liver disease) 11/91/478209/07/2018  . Elevated triglycerides with high cholesterol 03/18/2018  . Elevated LDL cholesterol level 03/18/2018  . Morbid obesity (HCC) 02/27/2018  . History of hyperlipidemia, mixed 02/27/2018  . h/o elevated LFT's- dx of Fatty liver disease, nonalcoholic 02/27/2018  . Primary osteoarthritis of left knee 07/30/2017  . Failed total knee arthroplasty, initial  encounter (HCC) 07/27/2017  . S/P left TKA 04/24/2016  . S/P knee replacement 04/24/2016  . Thoracic aortic aneurysm (HCC) 01/07/2016  . Midsternal chest pain 11/14/2015  . Essential hypertension 11/14/2015  . Lactic acidosis 11/14/2015  . Dehydration 11/14/2015  . Positive D dimer   . Small bowel obstruction (HCC)   . Abdominal distension   . Chest pain 11/11/2015  . SBO (small bowel obstruction) (HCC) 02/27/2015  . Enteritis 02/27/2015  . Abdominal pain, periumbilical 02/27/2015  . Nausea and vomiting 02/27/2015  . Hypertension 02/27/2015  . Hyperlipidemia 02/27/2015  . Leukocytosis 02/27/2015  . Elevated LFTs 03/07/2012  . GI problem 03/07/2012  . Sleep apnea 03/07/2012    Past Surgical History:  Procedure Laterality Date  . HERNIA REPAIR Left    inguinal, and umbilical  . JOINT REPLACEMENT    . REVISION TOTAL KNEE ARTHROPLASTY Left 07/30/2017   LEFT TOTAL KNEE PARTIAL REVISION AND SYNOVECTOMY/notes 07/30/2017  . TOTAL KNEE ARTHROPLASTY Left 04/24/2016   Procedure: LEFT TOTAL KNEE ARTHROPLASTY;  Surgeon: Durene RomansMatthew Olin, MD;  Location: [facility] ORS;  Service: Orthopedics;  Laterality: Left;  . TOTAL KNEE REVISION Left 07/30/2017   Procedure: LEFT TOTAL KNEE PARTIAL REVISION AND SYNOVECTOMY;  Surgeon: Gean Birchwoodowan, Frank, MD;  Location: [facility] OR;  Service: Orthopedics;  Laterality: Left;        Home Medications    Prior to Admission medications   Medication Sig Start  Date End Date Taking? Authorizing Provider  amLODipine (NORVASC) 10 MG tablet TAKE 1 TABLET BY MOUTH DAILY. NEED APPOINTMENT 10/09/18   Runell Gess, MD  atorvastatin (LIPITOR) 40 MG tablet Take 0.5 tablets (20 mg total) by mouth at bedtime. 05/09/18   Thomasene Lot, DO  carvedilol (COREG) 6.25 MG tablet 1 tab twice daily for BP 05/09/18   Opalski, Deborah, DO  losartan-hydrochlorothiazide (HYZAAR) 100-12.5 MG tablet Take 1 tablet by mouth daily. 05/09/18   Opalski, Gavin Pound, DO  meloxicam (MOBIC) 15 MG tablet Take 1  tablet by mouth daily. 02/21/18   [provider]  Multiple Vitamins-Minerals (MULTIVITAMIN WITH MINERALS) tablet Take 1 tablet by mouth daily.    [provider]  Omega-3 Fatty Acids (FISH OIL) 1200 MG CAPS Take 1,200 mg by mouth daily.    [provider]    Family History Family History  Problem Relation Age of Onset  . Stroke Mother   . Heart disease Mother   . Heart attack Maternal Grandmother        >56s of age  . Hypertension Father   . Diabetes Father   . Cancer Father        bone  . Hyperlipidemia Father     Social History Social History   Tobacco Use  . Smoking status: Former Games developer  . Smokeless tobacco: Never Used  . Tobacco comment: off and on as a teenager - very little per patient   Substance Use Topics  . Alcohol use: No  . Drug use: No     Allergies   Patient has no known allergies.   Review of Systems Review of Systems  Constitutional: Positive for diaphoresis. Negative for chills and fever.  Respiratory: Positive for chest tightness and shortness of breath.   Cardiovascular: Positive for chest pain. Negative for palpitations and leg swelling.  Gastrointestinal: Positive for nausea. Negative for abdominal pain, blood in stool, constipation, diarrhea and vomiting.  Genitourinary: Negative for difficulty urinating.  Musculoskeletal: Positive for neck pain. Negative for back pain.  Skin: Negative for rash and wound.  Allergic/Immunologic: Negative for immunocompromised state.  Neurological: Negative for dizziness, weakness and headaches.  Hematological: Does not bruise/bleed easily.  All other systems reviewed and are negative.    Physical Exam Updated Vital Signs BP (!) 142/89   Pulse 66   Temp (!) 97.5 F (36.4 C) (Oral)   Resp 17   Ht 5\' 10"  (1.778 m)   Wt 131.1 kg   SpO2 94%   BMI 41.47 kg/m   Physical Exam Vitals signs and nursing note reviewed.  Constitutional:      General: He is not in acute distress.     Appearance: He is well-developed. He is diaphoretic.  HENT:     Head: Normocephalic and atraumatic.  Cardiovascular:     Rate and Rhythm: Normal rate and regular rhythm.     Pulses:          Radial pulses are 2+ on the right side and 2+ on the left side.       Posterior tibial pulses are 2+ on the right side and 2+ on the left side.     Heart sounds: Normal heart sounds. Heart sounds not distant. No murmur.  Pulmonary:     Effort: Pulmonary effort is normal.     Breath sounds: Normal breath sounds. No decreased breath sounds or wheezing.  Chest:     Chest wall: No tenderness.  Abdominal:     Palpations: Abdomen  is soft.     Tenderness: There is no abdominal tenderness.  Musculoskeletal:     Right lower leg: He exhibits no tenderness. No edema.     Left lower leg: He exhibits no tenderness. No edema.  Skin:    General: Skin is warm.     Coloration: Skin is pale.  Neurological:     Mental Status: He is alert and oriented to person, place, and time.  Psychiatric:        Behavior: Behavior normal.      ED Treatments / Results  Labs (all labs ordered are listed, but only abnormal results are displayed) Labs Reviewed  BASIC METABOLIC PANEL - Abnormal; Notable for the following components:      Result Value   Glucose, Bld 101 (*)    Calcium 8.6 (*)    All other components within normal limits  CBC - Abnormal; Notable for the following components:   Platelets 145 (*)    All other components within normal limits  I-STAT TROPONIN, ED  I-STAT TROPONIN, ED    EKG EKG Interpretation  Date/Time:  Friday October 11 2018 10:08:35 EST Ventricular Rate:  68 PR Interval:    QRS Duration: 114 QT Interval:  416 QTC Calculation: 443 R Axis:   -35 Text Interpretation:  Sinus rhythm Incomplete RBBB and LAFB Confirmed by Virgina NorfolkAdam, [place] [phone]([id number]) on 10/11/2018 10:12:32 AM Also confirmed by Virgina NorfolkAdam, [place] [phone]), editor Elita QuickWatlington, [place] [phone]([id number])  on 10/11/2018 2:03:39  PM   Radiology Dg Chest Port 1 View  Result Date: 10/11/2018 CLINICAL DATA:  Chest pain. EXAM: PORTABLE CHEST 1 VIEW COMPARISON:  Portable chest 11/12/2015.  Chest CT 02/20/2017. FINDINGS: 1303 hours. The heart size is stable at the upper limits of normal for portable technique. The mediastinal contours are normal. The lungs are clear. There is no pleural effusion or pneumothorax. No acute osseous findings are seen. IMPRESSION: Stable chest.  No active cardiopulmonary process. Electronically Signed   By: Carey BullocksWilliam  Veazey M.D.   On: 10/11/2018 13:33   Ct Angio Chest/abd/pel For Dissection W And/or W/wo  Result Date: 10/11/2018 CLINICAL DATA:  58 year old male with central chest pain radiating into the right neck and a reported personal history of aneurysm. EXAM: CT ANGIOGRAPHY CHEST, ABDOMEN AND PELVIS TECHNIQUE: Multidetector CT imaging through the chest, abdomen and pelvis was performed using the standard protocol during bolus administration of intravenous contrast. Multiplanar reconstructed images and MIPs were obtained and reviewed to evaluate the vascular anatomy. CONTRAST:  100mL ISOVUE-370 IOPAMIDOL (ISOVUE-370) INJECTION 76% COMPARISON:  Prior CT scan of the chest 02/20/2017; prior CT scan of the abdomen and pelvis 11/12/2015 FINDINGS: CTA CHEST FINDINGS Cardiovascular: Initial unenhanced CT imaging demonstrates no evidence of acute intramural hematoma. Following administration of intravenous contrast, there is excellent opacification of the thoracic aorta. Two vessel arch anatomy. The right brachiocephalic and left common carotid artery share a common origin. No evidence of aortic dissection or abnormality of the arch vessels. Mild fusiform aneurysmal dilatation of the ascending thoracic aorta with a maximal diameter of 4.4 cm compared to 02/20/2017. The main pulmonary artery is normal in size. No evidence of central pulmonary embolus. Cardiomegaly with right heart enlargement. No pericardial  effusion. Mediastinum/Nodes: No acute fracture or aggressive appearing lytic or blastic osseous lesion. Lungs/Pleura: Mild dependent atelectasis. No pneumothorax or pleural effusion. No suspicious pulmonary nodule or mass. Musculoskeletal: No acute fracture or aggressive appearing lytic or blastic osseous lesion. Review of the MIP images confirms the above findings. CTA ABDOMEN AND PELVIS  FINDINGS VASCULAR Aorta: Normal caliber aorta without aneurysm, dissection, vasculitis or significant stenosis. 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 No evidence of dissection or fibromuscular dysplasia. SMA: Patent without evidence of aneurysm, dissection, vasculitis or significant stenosis. Renals: Both renal arteries are patent without evidence of aneurysm, dissection, vasculitis, fibromuscular dysplasia or significant stenosis. IMA: Patent without evidence of aneurysm, dissection, vasculitis or significant stenosis. Inflow: Patent without evidence of aneurysm, dissection, vasculitis or significant stenosis. Veins: No obvious venous abnormality within the limitations of this arterial phase study. Review of the MIP images confirms the above findings. NON-VASCULAR Hepatobiliary: Normal hepatic contour and morphology. No discrete hepatic lesions. Normal appearance of the gallbladder. No intra or extrahepatic biliary ductal dilatation. Pancreas: Unremarkable. No pancreatic ductal dilatation or surrounding inflammatory changes. Spleen: Normal in size without focal abnormality. Adrenals/Urinary Tract: 7.5 mm stone in the lower pole of the left kidney without evidence of obstruction. More punctate 3 mm stone in the interpolar right renal collecting system. No evidence of hydronephrosis or enhancing renal mass. Unremarkable ureters and bladder. Stomach/Bowel: No evidence of obstruction or focal bowel wall thickening. Normal appendix in the right lower  quadrant. The terminal ileum is unremarkable. Lymphatic: Prominent upper abdominal portal caval, and gastrohepatic ligament lymph nodes demonstrate no significant interval change dating back to 2017. The nodes are likely chronic/reactive. Reproductive: Prostate is unremarkable. Other: No abdominal wall hernia or abnormality. No abdominopelvic ascites. Musculoskeletal: No acute fracture or aggressive appearing lytic or blastic osseous lesion. Review of the MIP images confirms the above findings. IMPRESSION: 1. Stable (compared to 02/20/2017) aneurysmal dilatation of the ascending thoracic aorta with a maximal diameter of 4.4 cm. Recommend annual imaging followup by CTA or MRA. This recommendation follows 2010 ACCF/AHA/AATS/ACR/ASA/SCA/SCAI/SIR/STS/SVM Guidelines for the Diagnosis and Management of Patients with Thoracic Aortic Disease. Circulation. 2010; 121: I325-Q982. Aortic aneurysm NOS (ICD10-I71.9). 2. 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 3. Cardiomegaly with right heart enlargement. 4. Bilateral nonobstructing nephrolithiasis. 5. Stable prominent and likely reactive upper abdominal lymph nodes dating back to at least 2017. Electronically Signed   By: Malachy Moan M.D.   On: 10/11/2018 14:21    Procedures Procedures (including critical care time)  Medications Ordered in ED Medications  aspirin chewable tablet 324 mg (324 mg Oral Given 10/11/18 1057)  ondansetron (ZOFRAN-ODT) disintegrating tablet 8 mg (8 mg Oral Given 10/11/18 1055)  morphine 4 MG/ML injection 4 mg (4 mg Intravenous Given 10/11/18 1056)  iopamidol (ISOVUE-370) 76 % injection 100 mL (100 mLs Intravenous Contrast Given 10/11/18 1352)     Initial Impression / Assessment and Plan / ED Course  I have reviewed the triage vital signs and the nursing notes.  Pertinent labs & imaging results that were available during my care of the patient were reviewed by me and considered in my  medical decision making (see chart for details).  Clinical Course as of Oct 11 1456  Fri Oct 11, 2018  1036 Initial blood pressure obtained at triage with a small fitting cuff measured at 172/114.  Proper cuff placed on patient repeat blood pressure 140/90.   [LM]  5227 58 year old male with history of thoracic aneurysm presents with complaint of chest pain.  Patient states that he was walking at work when he developed sudden sharp midsternal pain in his chest which radiates towards the right side of his neck with tightness in his chest associated with diaphoresis,  nausea, shortness of breath.  Due to his history of aneurysm patient was even more concerned about this pain episode which prompted his visit to the emergency room.  Patient states that he had a similar episode although not as severe and did not last as long which occurred about 1 week ago.  CT dissection study chest abdomen pelvis shows stable thoracic aneurysm at 4.4 cm.  Troponin x2-, chest x-ray negative for acute ischemic changes.  BMP and CBC unremarkable.  She was given morphine for his pain as well as an aspirin.  Patient reports ongoing chest tightness, sharp pain has resolved.  Case discussed with Dr. Lockie Mola, ER attending, recommends hospital admission for ACS rule out.  Discussed plan of care with patient who is agreeable to admission.   [LM]  1435 Heart score 6   [LM]  1455 Case discussed with Dr. Marland Mcalpine, [facility], will consult for admission, requests cardiology consult.    [LM]  1457 Discussed with Trish with Cardiology, cardiology to consult.    [LM]    Clinical Course User Index [LM] Jeannie Fend, PA-C   Final Clinical Impressions(s) / ED Diagnoses   Final diagnoses:  Chest pain, unspecified type  Thoracic aortic aneurysm without rupture [facility])    ED Discharge Orders    None       Jeannie Fend, PA-C 10/11/18 1458    Virgina Norfolk, DO 10/11/18 1521

## 2018-10-11 NOTE — ED Notes (Signed)
Patient transported to CT 

## 2018-10-12 DIAGNOSIS — I712 Thoracic aortic aneurysm, without rupture: Secondary | ICD-10-CM | POA: Diagnosis not present

## 2018-10-12 DIAGNOSIS — R079 Chest pain, unspecified: Secondary | ICD-10-CM | POA: Diagnosis not present

## 2018-10-12 DIAGNOSIS — R0789 Other chest pain: Secondary | ICD-10-CM | POA: Diagnosis not present

## 2018-10-12 DIAGNOSIS — I10 Essential (primary) hypertension: Secondary | ICD-10-CM | POA: Diagnosis not present

## 2018-10-12 DIAGNOSIS — E785 Hyperlipidemia, unspecified: Secondary | ICD-10-CM | POA: Diagnosis not present

## 2018-10-12 LAB — CBC WITH DIFFERENTIAL/PLATELET
Abs Immature Granulocytes: 0.01 10*3/uL (ref 0.00–0.07)
Basophils Absolute: 0 10*3/uL (ref 0.0–0.1)
Basophils Relative: 1 %
Eosinophils Absolute: 0.2 10*3/uL (ref 0.0–0.5)
Eosinophils Relative: 4 %
HCT: 39.8 % (ref 39.0–52.0)
Hemoglobin: 14 g/dL (ref 13.0–17.0)
Immature Granulocytes: 0 %
Lymphocytes Relative: 43 %
Lymphs Abs: 2.8 10*3/uL (ref 0.7–4.0)
MCH: 32.2 pg (ref 26.0–34.0)
MCHC: 35.2 g/dL (ref 30.0–36.0)
MCV: 91.5 fL (ref 80.0–100.0)
Monocytes Absolute: 0.5 10*3/uL (ref 0.1–1.0)
Monocytes Relative: 8 %
Neutro Abs: 2.8 10*3/uL (ref 1.7–7.7)
Neutrophils Relative %: 44 %
Platelets: 126 10*3/uL — ABNORMAL LOW (ref 150–400)
RBC: 4.35 MIL/uL (ref 4.22–5.81)
RDW: 12.3 % (ref 11.5–15.5)
WBC: 6.4 10*3/uL (ref 4.0–10.5)
nRBC: 0 % (ref 0.0–0.2)

## 2018-10-12 LAB — COMPREHENSIVE METABOLIC PANEL
ALT: 96 U/L — ABNORMAL HIGH (ref 0–44)
AST: 75 U/L — ABNORMAL HIGH (ref 15–41)
Albumin: 3.6 g/dL (ref 3.5–5.0)
Alkaline Phosphatase: 57 U/L (ref 38–126)
Anion gap: 10 (ref 5–15)
BUN: 18 mg/dL (ref 6–20)
CO2: 28 mmol/L (ref 22–32)
Calcium: 8.6 mg/dL — ABNORMAL LOW (ref 8.9–10.3)
Chloride: 101 mmol/L (ref 98–111)
Creatinine, Ser: 1.13 mg/dL (ref 0.61–1.24)
GFR calc Af Amer: >60 mL/min (ref >60)
GFR calc non Af Amer: >60 mL/min (ref >60)
Glucose, Bld: 101 mg/dL — ABNORMAL HIGH (ref 70–99)
Potassium: 3.2 mmol/L — ABNORMAL LOW (ref 3.5–5.1)
Sodium: 139 mmol/L (ref 135–145)
Total Bilirubin: 0.8 mg/dL (ref 0.3–1.2)
Total Protein: 6.6 g/dL (ref 6.5–8.1)

## 2018-10-12 LAB — LIPID PANEL
Cholesterol: 187 mg/dL (ref 0–200)
HDL: 46 mg/dL (ref >40)
LDL Cholesterol: 82 mg/dL (ref 0–99)
Total CHOL/HDL Ratio: 4.1 RATIO
Triglycerides: 293 mg/dL — ABNORMAL HIGH (ref <150)
VLDL: 59 mg/dL — ABNORMAL HIGH (ref 0–40)

## 2018-10-12 LAB — TROPONIN I: Troponin I: <0.03 ng/mL (ref <0.03)

## 2018-10-12 LAB — MAGNESIUM: Magnesium: 1.8 mg/dL (ref 1.7–2.4)

## 2018-10-12 LAB — PHOSPHORUS: Phosphorus: 3.8 mg/dL (ref 2.5–4.6)

## 2018-10-12 LAB — HEMOGLOBIN A1C
Hgb A1c MFr Bld: 5.9 % — ABNORMAL HIGH (ref 4.8–5.6)
Mean Plasma Glucose: 122.63 mg/dL

## 2018-10-12 LAB — TSH: TSH: 3.196 u[IU]/mL (ref 0.350–4.500)

## 2018-10-12 MED ORDER — ASPIRIN 81 MG PO TBEC: 81.0000 mg | DELAYED_RELEASE_TABLET | Freq: Every day | ORAL | 0 refills | Status: AC

## 2018-10-12 NOTE — Consult Note (Signed)
Cardiology Consultation:   Patient ID: Keith Vance MRN: [id number]; DOB: [birth year]  Admit date: 10/11/2018 Date of Consult: 10/12/2018  Primary Care Provider: Thomasene Lotpalski, Deborah, DO Primary Cardiologist: Allyson SabalBerry Primary Electrophysiologist:  None     Patient Profile:   Keith Vance is a 58 y.o. male with a hx of thoracic aneurysm, HTN and obesity who is being seen today for the evaluation of chest pain  at the request of Dr Lourdes SledgeLatif.  History of Present Illness:   Mr. Keith Vance 58 y.o. HTN, Thoracic aneurysm HLD. No history of CAD with normal cath at [facility] and normal myovue In 2017. Admitted with atypical chest pain  Sudden pain in his chest at work yesterday morning Radiated to right neck Pain was 8/10 Accompanied by dizziness and diaphoresis Resolved spontaneously Pain free this am.  ECG no acute changes troponin negative x 3. CT shows stable ascending thoracic aneurysm 4.4 cm. TTE normal with no RWMA and normal EF Pain free this am    Past Medical History:  Diagnosis Date  . Arthritis   . Ascending aortic aneurysm (HCC)    a. 10/2015 CTA chest: 4.4cm aneurysmal dil of Asc Ao-->rec f/u in 1 yr.  . Chest pain 10/11/2018  . GERD (gastroesophageal reflux disease)    hx  . History of kidney stones   . Hyperlipidemia   . Hypertension   . Pneumonia    hx  . SBO (small bowel obstruction) (HCC)    a. 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  [name]. Sleep apnea    cpap    Past Surgical History:  Procedure Laterality Date  . HERNIA REPAIR Left    inguinal, and umbilical  . JOINT REPLACEMENT    . REVISION TOTAL KNEE ARTHROPLASTY Left 07/30/2017   LEFT TOTAL KNEE PARTIAL REVISION AND SYNOVECTOMY/notes 07/30/2017  . TOTAL KNEE ARTHROPLASTY Left 04/24/2016   Procedure: LEFT TOTAL KNEE ARTHROPLASTY;  Surgeon: Durene RomansMatthew Olin, MD;  Location: [facility];  Service: Orthopedics;  Laterality: Left;  . TOTAL KNEE REVISION Left 07/30/2017   Procedure: LEFT TOTAL KNEE PARTIAL REVISION AND SYNOVECTOMY;  Surgeon: Gean Birchwoodowan, Frank,  MD;  Location: [facility] OR;  Service: Orthopedics;  Laterality: Left;     Home Medications:  Prior to Admission medications   Medication Sig Start Date End Date Taking? Authorizing Provider  amLODipine (NORVASC) 10 MG tablet TAKE 1 TABLET BY MOUTH DAILY. NEED APPOINTMENT Patient taking differently: Take 10 mg by mouth daily.  10/09/18  Yes Runell GessBerry, Jonathan J, MD  atorvastatin (LIPITOR) 40 MG tablet Take 0.5 tablets (20 mg total) by mouth at bedtime. 05/09/18  Yes Opalski, Gavin Poundeborah, DO  carvedilol (COREG) 6.25 MG tablet 1 tab twice daily for BP Patient taking differently: Take 6.25 mg by mouth 2 (two) times daily.  05/09/18  Yes Opalski, Gavin Poundeborah, DO  docusate sodium (COLACE) 100 MG capsule Take 200-300 mg by mouth See admin instructions. 300mg  in the morning and 200mg  at night   Yes [provider]  losartan-hydrochlorothiazide (HYZAAR) 100-12.5 MG tablet Take 1 tablet by mouth daily. 05/09/18  Yes Opalski, Gavin Poundeborah, DO  meloxicam (MOBIC) 15 MG tablet Take 1 tablet by mouth daily. 02/21/18  Yes [provider]  Multiple Vitamins-Minerals (MULTIVITAMIN WITH MINERALS) tablet Take 1 tablet by mouth daily.   Yes [provider]  Omega-3 Fatty Acids (FISH OIL) 1200 MG CAPS Take 1,200 mg by mouth daily.   Yes [provider]    Inpatient Medications: Scheduled Meds: . amLODipine  10 mg Oral Daily  . aspirin EC  81 mg Oral Daily  . atorvastatin  20 mg Oral QHS  . carvedilol  6.25 mg Oral BID WC  . docusate sodium  200 mg Oral QHS  . docusate sodium  300 mg Oral Daily  . enoxaparin (LOVENOX) injection  40 mg Subcutaneous Q24H  . hydrochlorothiazide  12.5 mg Oral Daily  . losartan  100 mg Oral Daily  . multivitamin with minerals  1 tablet Oral Daily  . omega-3 acid ethyl esters  2 g Oral Daily   Continuous Infusions:  PRN Meds: acetaminophen, ALPRAZolam, morphine injection, ondansetron (ZOFRAN) IV  Allergies:   No Known Allergies  Social History:   Social History     Socioeconomic History  . Marital status: Married    Spouse name: Not on file  . Number of children: Not on file  . Years of education: Not on file  . Highest education level: Not on file  Occupational History  . Not on file  Social Needs  . Financial resource strain: Not on file  . Food insecurity:    Worry: Not on file    Inability: Not on file  . Transportation needs:    Medical: Not on file    Non-medical: Not on file  Tobacco Use  . Smoking status: Former Games developer  . Smokeless tobacco: Never Used  . Tobacco comment: off and on as a teenager - very little per patient   Substance and Sexual Activity  . Alcohol use: No  . Drug use: No  . Sexual activity: Yes  Lifestyle  . Physical activity:    Days per week: Not on file    Minutes per session: Not on file  . Stress: Not on file  Relationships  . Social connections:    Talks on phone: Not on file    Gets together: Not on file    Attends religious service: Not on file    Active member of club or organization: Not on file    Attends meetings of clubs or organizations: Not on file    Relationship status: Not on file  . Intimate partner violence:    Fear of current or ex partner: Not on file    Emotionally abused: Not on file    Physically abused: Not on file    Forced sexual activity: Not on file  Other Topics Concern  . Not on file  Social History Narrative  . Not on file    Family History:    Family History  Problem Relation Age of Onset  . Stroke Mother   . Heart disease Mother   . Heart attack Maternal Grandmother        >11s of age  . Hypertension Father   . Diabetes Father   . Cancer Father        bone  . Hyperlipidemia Father      ROS:  Please see the history of present illness.   All other ROS reviewed and negative.     Physical Exam/Data:   Vitals:   10/11/18 1537 10/11/18 1617 10/11/18 2035 10/12/18 0601  BP: (!) 143/90 (!) 170/121 (!) 145/88 (!) 149/92  Pulse: 64 71 (!) 58 61  Resp:  16 16 20 13   Temp:  (!) 97.4 F (36.3 C) 98.2 F (36.8 C) 97.9 F (36.6 C)  TempSrc:  Oral Oral Oral  SpO2: 96% 96% 92% 97%  Weight:  131 kg    Height:  5\' 10"  (1.778 m)     No intake or  output data in the 24 hours ending 10/12/18 0846 Last 3 Weights 10/11/2018 10/11/2018 05/09/2018  Weight (lbs) 288 lb 13.6 oz 289 lb 279 lb 9.6 oz  Weight (kg) 131.02 kg 131.09 kg 126.826 kg     Body mass index is 41.45 kg/m.  General:  Well nourished, well developed, in no acute distress HEENT: normal Lymph: no adenopathy Neck: no JVD Endocrine:  No thryomegaly Vascular: No carotid bruits; FA pulses 2+ bilaterally without bruits  Cardiac:  normal S1, S2; RRR; no murmur  Lungs:  clear to auscultation bilaterally, no wheezing, rhonchi or rales  Abd: soft, nontender, no hepatomegaly  Ext: no edema Musculoskeletal:  No deformities, BUE and BLE strength normal and equal Skin: warm and dry  Neuro:  CNs 2-12 intact, no focal abnormalities noted Psych:  Normal affect   EKG:  The EKG was personally reviewed and demonstrates:  NSR no acute changes LAD  Telemetry:  Telemetry was personally reviewed and demonstrates:  NSR no arrhythmia   Relevant CV Studies: TTE normal no RWMAls tri leaflet AV   Laboratory Data:  Chemistry Recent Labs  Lab 10/11/18 1022 10/12/18 0047  NA 139 139  K 3.5 3.2*  CL 102 101  CO2 25 28  GLUCOSE 101* 101*  BUN 20 18  CREATININE 1.22 1.13  CALCIUM 8.6* 8.6*  GFRNONAA >60 >60  GFRAA >60 >60  ANIONGAP 12 10    Recent Labs  Lab 10/12/18 0047  PROT 6.6  ALBUMIN 3.6  AST 75*  ALT 96*  ALKPHOS 57  BILITOT 0.8   Hematology Recent Labs  Lab 10/11/18 1022 10/12/18 0047  WBC 6.9 6.4  RBC 4.60 4.35  HGB 14.3 14.0  HCT 42.2 39.8  MCV 91.7 91.5  MCH 31.1 32.2  MCHC 33.9 35.2  RDW 12.2 12.3  PLT 145* 126*   Cardiac Enzymes Recent Labs  Lab 10/11/18 1921 10/11/18 2246 10/12/18 0047  TROPONINI <0.03 <0.03 <0.03    Recent Labs  Lab 10/11/18 1037  10/11/18 1353  TROPIPOC 0.01 0.01    BNPNo results for input(s): BNP, PROBNP in the last 168 hours.  DDimer No results for input(s): DDIMER in the last 168 hours.  Radiology/Studies:  Dg Chest Port 1 View  Result Date: 10/11/2018 CLINICAL DATA:  Chest pain. EXAM: PORTABLE CHEST 1 VIEW COMPARISON:  Portable chest 11/12/2015.  Chest CT 02/20/2017. FINDINGS: 1303 hours. The heart size is stable at the upper limits of normal for portable technique. The mediastinal contours are normal. The lungs are clear. There is no pleural effusion or pneumothorax. No acute osseous findings are seen. IMPRESSION: Stable chest.  No active cardiopulmonary process. Electronically Signed   By: Carey BullocksWilliam  Veazey M.D.   On: 10/11/2018 13:33   Ct Angio Chest/abd/pel For Dissection W And/or W/wo  Result Date: 10/11/2018 CLINICAL DATA:  58 year old male with central chest pain radiating into the right neck and a reported personal history of aneurysm. EXAM: CT ANGIOGRAPHY CHEST, ABDOMEN AND PELVIS TECHNIQUE: Multidetector CT imaging through the chest, abdomen and pelvis was performed using the standard protocol during bolus administration of intravenous contrast. Multiplanar reconstructed images and MIPs were obtained and reviewed to evaluate the vascular anatomy. CONTRAST:  100mL ISOVUE-370 IOPAMIDOL (ISOVUE-370) INJECTION 76% COMPARISON:  Prior CT scan of the chest 02/20/2017; prior CT scan of the abdomen and pelvis 11/12/2015 FINDINGS: CTA CHEST FINDINGS Cardiovascular: Initial unenhanced CT imaging demonstrates no evidence of acute intramural hematoma. Following administration of intravenous contrast, there is excellent opacification of the thoracic aorta.  Two vessel arch anatomy. The right brachiocephalic and left common carotid artery share a common origin. No evidence of aortic dissection or abnormality of the arch vessels. Mild fusiform aneurysmal dilatation of the ascending thoracic aorta with a maximal diameter of 4.4 cm  compared to 02/20/2017. The main pulmonary artery is normal in size. No evidence of central pulmonary embolus. Cardiomegaly with right heart enlargement. No pericardial effusion. Mediastinum/Nodes: No acute fracture or aggressive appearing lytic or blastic osseous lesion. Lungs/Pleura: Mild dependent atelectasis. No pneumothorax or pleural effusion. No suspicious pulmonary nodule or mass. Musculoskeletal: No acute fracture or aggressive appearing lytic or blastic osseous lesion. Review of the MIP images confirms the above findings. CTA ABDOMEN AND PELVIS FINDINGS VASCULAR Aorta: Normal caliber aorta without aneurysm, dissection, vasculitis or significant stenosis. 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 No evidence of dissection or fibromuscular dysplasia. SMA: Patent without evidence of aneurysm, dissection, vasculitis or significant stenosis. Renals: Both renal arteries are patent without evidence of aneurysm, dissection, vasculitis, fibromuscular dysplasia or significant stenosis. IMA: Patent without evidence of aneurysm, dissection, vasculitis or significant stenosis. Inflow: Patent without evidence of aneurysm, dissection, vasculitis or significant stenosis. Veins: No obvious venous abnormality within the limitations of this arterial phase study. Review of the MIP images confirms the above findings. NON-VASCULAR Hepatobiliary: Normal hepatic contour and morphology. No discrete hepatic lesions. Normal appearance of the gallbladder. No intra or extrahepatic biliary ductal dilatation. Pancreas: Unremarkable. No pancreatic ductal dilatation or surrounding inflammatory changes. Spleen: Normal in size without focal abnormality. Adrenals/Urinary Tract: 7.5 mm stone in the lower pole of the left kidney without evidence of obstruction. More punctate 3 mm stone in the interpolar right renal collecting system. No evidence of hydronephrosis or  enhancing renal mass. Unremarkable ureters and bladder. Stomach/Bowel: No evidence of obstruction or focal bowel wall thickening. Normal appendix in the right lower quadrant. The terminal ileum is unremarkable. Lymphatic: Prominent upper abdominal portal caval, and gastrohepatic ligament lymph nodes demonstrate no significant interval change dating back to 2017. The nodes are likely chronic/reactive. Reproductive: Prostate is unremarkable. Other: No abdominal wall hernia or abnormality. No abdominopelvic ascites. Musculoskeletal: No acute fracture or aggressive appearing lytic or blastic osseous lesion. Review of the MIP images confirms the above findings. IMPRESSION: 1. Stable (compared to 02/20/2017) aneurysmal dilatation of the ascending thoracic aorta with a maximal diameter of 4.4 cm. Recommend annual imaging followup by CTA or MRA. This recommendation follows 2010 ACCF/AHA/AATS/ACR/ASA/SCA/SCAI/SIR/STS/SVM Guidelines for the Diagnosis and Management of Patients with Thoracic Aortic Disease. Circulation. 2010; 121: B147-W295. Aortic aneurysm NOS (ICD10-I71.9). 2. 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 3. Cardiomegaly with right heart enlargement. 4. Bilateral nonobstructing nephrolithiasis. 5. Stable prominent and likely reactive upper abdominal lymph nodes dating back to at least 2017. Electronically Signed   By: Malachy Moan M.D.   On: 10/11/2018 14:21    Assessment and Plan:   1. Chest Pain: atypical r/o no acute ECG changes troponin negative x 3. Normal myovue 2017 Ok to d/c home will arrange f/u with Dr Allyson Sabal consider outpatient myovue to risk stratify 2. Thoracic aneurysm:  Stable by CT 4.4 cm yearly CTA control BP 3. HTN:  Well controlled.  Continue current medications and low sodium Dash type diet.   4.   [facility] will sign off.   Medication Recommendations:  No changes  Other recommendations (labs, testing, etc):  F/U Dr Allyson Sabal as  outpatient  Follow up as an outpatient:  Allyson Sabal   For questions or updates, please contact [facility] Please consult [email or web address] for contact info under     Signed, Charlton Haws, MD  10/12/2018 8:46 AM

## 2018-10-12 NOTE — Discharge Summary (Signed)
Physician Discharge Summary  Patient ID: Keith Vance MRN: [id number] DOB/AGE: Jan 07, 1961 58 y.o.  Admit date: 10/11/2018 Discharge date: 10/12/2018  Admission Diagnoses:  Discharge Diagnoses:  Active Problems:   Chest pain   Hypertension   Hyperlipidemia   Midsternal chest pain   Thoracic aortic aneurysm (HCC)   Sleep apnea   Morbid obesity (HCC)   Discharged Condition: stable  Hospital Course: Keith Vance is a 58 year old Caucasian male, morbidly obese, with past medical history significant for 4.4 cm ascending aortic aneurysm, GERD, hypertension, hyperlipidemia history of nephrolithiasis, history of pneumonia, small bowel obstruction, history of sleep apnea amongst other comorbidities.  Patient was admitted with chest pain.  Chest pain seemed atypical.  Patient was admitted for further assessment and management.  Cardiac enzymes were cycled, and they came back negative.  EKG was not suggestive of acute coronary syndrome.  Overnight, chest pain has resolved.  Cardiology team was consulted to assist with patient's management.  Patient has been cleared for discharge by the cardiology team.  Patient is eager to be discharged back home.  Patient will be discharged back on to the care of the primary care provider and cardiologist.  Consults: cardiology  Significant Diagnostic Studies:  Troponins came back negative. EKG was not suggestive of acute coronary syndrome.  Echocardiogram revealed "The left ventricle has normal systolic function with an ejection fraction of 60-65%. The cavity size was normal. There is mildly increased left ventricular wall thickness. Left ventricular diastolic Doppler parameters are consistent with impaired  relaxation.  2. The right ventricle has normal systolic function. The cavity was normal. There is no increase in right ventricular wall thickness.  3. Left atrial size was moderately dilated.  4. The mitral valve is normal in structure. Mild  calcification of the anterior mitral valve leaflet.  5. The tricuspid valve is normal in structure.  6. The aortic valve is tricuspid.  7. The pulmonic valve was normal in structure.  8. The ascending aorta is normal in size and structure.  9. There is mild dilatation of the aortic root measuring 38 mm. 10. Right atrial pressure is estimated at 5 mmHg".  Discharge Exam: Blood pressure (!) 151/99, pulse 61, temperature 97.9 F (36.6 C), temperature source Oral, resp. rate 13, height 5\' 10"  (1.778 m), weight 131 kg, SpO2 97 %.  Disposition: Discharge disposition: 01-Home or Self Care   Discharge Instructions    Diet - low sodium heart healthy   Complete by:  As directed    Increase activity slowly   Complete by:  As directed      Allergies as of 10/12/2018   No Known Allergies     Medication List    STOP taking these medications   meloxicam 15 MG tablet Commonly known as:  MOBIC     TAKE these medications   amLODipine 10 MG tablet Commonly known as:  NORVASC TAKE 1 TABLET BY MOUTH DAILY. NEED APPOINTMENT What changed:  See the new instructions.   aspirin 81 MG EC tablet Take 1 tablet (81 mg total) by mouth daily. Start taking on:  October 13, 2018   atorvastatin 40 MG tablet Commonly known as:  LIPITOR Take 0.5 tablets (20 mg total) by mouth at bedtime.   carvedilol 6.25 MG tablet Commonly known as:  COREG 1 tab twice daily for BP What changed:    how much to take  how to take this  when to take this  additional instructions   docusate sodium 100 MG  capsule Commonly known as:  COLACE Take 200-300 mg by mouth See admin instructions. 300mg  in the morning and 200mg  at night   Fish Oil 1200 MG Caps Take 1,200 mg by mouth daily.   losartan-hydrochlorothiazide 100-12.5 MG tablet Commonly known as:  HYZAAR Take 1 tablet by mouth daily.   multivitamin with minerals tablet Take 1 tablet by mouth daily.        SignedBarnetta Chapel 10/12/2018,  1:53 PM

## 2018-10-13 LAB — HIV ANTIBODY (ROUTINE TESTING W REFLEX): HIV Screen 4th Generation wRfx: NONREACTIVE

## 2018-10-16 ENCOUNTER — Encounter: Payer: Self-pay | Admitting: Cardiovascular Disease

## 2018-10-16 ENCOUNTER — Ambulatory Visit: Payer: BLUE CROSS/BLUE SHIELD | Admitting: Cardiovascular Disease

## 2018-10-16 DIAGNOSIS — G4730 Sleep apnea, unspecified: Secondary | ICD-10-CM

## 2018-10-16 DIAGNOSIS — I712 Thoracic aortic aneurysm, without rupture, unspecified: Secondary | ICD-10-CM

## 2018-10-16 DIAGNOSIS — I10 Essential (primary) hypertension: Secondary | ICD-10-CM

## 2018-10-16 DIAGNOSIS — E782 Mixed hyperlipidemia: Secondary | ICD-10-CM | POA: Diagnosis not present

## 2018-10-16 NOTE — Assessment & Plan Note (Signed)
History of hyperlipidemia on atorvastatin with lipid profile performed 10/12/2018 revealing total cholesterol 187, LDL of 82 and HDL of 46.

## 2018-10-16 NOTE — Addendum Note (Signed)
Addended by: Harlow Asa on: 10/16/2018 03:00 PM   Modules accepted: Orders

## 2018-10-16 NOTE — Assessment & Plan Note (Signed)
History of small thoracic aortic aneurysm which has remained stable by CTA performed on 10/11/2018 measuring 4.4 cm.  This will be checked on an annual basis.

## 2018-10-16 NOTE — Assessment & Plan Note (Signed)
History of essential hypertension with blood pressure measured today 140/90.  He is on amlodipine, carvedilol, losartan and hydrochlorothiazide.  He avoids salt.

## 2018-10-16 NOTE — Assessment & Plan Note (Signed)
History of obstructive sleep apnea on CPAP. 

## 2018-10-16 NOTE — Patient Instructions (Signed)
Medication Instructions:  Your physician recommends that you continue on your current medications as directed. Please refer to the Current Medication list given to you today.  If you need a refill on your cardiac medications before your next appointment, please call your pharmacy.   Lab work: Your physician recommends that you return for lab work today: CBC, BMP  If you have labs (blood work) drawn today and your tests are completely normal, you will receive your results only by: [name] MyChart Message (if you have MyChart) OR . A paper copy in the mail If you have any lab test that is abnormal or we need to change your treatment, we will call you to review the results.  Testing/Procedures:  Your physician has recommended Non-Cardiac CT Angiography (CTA) which is a special type of CT scan that uses a computer to produce multi-dimensional views of major blood vessels throughout the body. In CT angiography, a contrast material is injected through an IV to help visualize the blood vessels.  Follow-Up: At [facility], you and your health needs are our priority.  As part of our continuing mission to provide you with exceptional heart care, we have created designated Provider Care Teams.  These Care Teams include your primary Cardiologist (physician) and Advanced Practice Providers (APPs -  Physician Assistants and Nurse Practitioners) who all work together to provide you with the care you need, when you need it. . You will need a follow up appointment in 3 months with an APP and in 12 months with Dr. Allyson Sabal.  Please call our office 2 months in advance to schedule this appointment.  You may see Dr. Allyson Sabal or one of the following Advanced Practice Providers on your designated Care Team:   . Corine Shelter, [place] . Azalee Course, PA-C . Micah Flesher, PA-C . Joni Reining, DNP . Theodore Demark, PA-C . Judy Pimple, PA-C . Marjie Skiff, PA-C

## 2018-10-16 NOTE — Progress Notes (Signed)
10/16/2018 Keith Vance   [birth year]  [id number]  Primary Physician Mellody Dance, DO Primary Cardiologist: Lorretta Harp MD FACP, [place], [place], [place]  HPI:  Keith Vance is a 58 y.o.  severely overweight married Caucasian male father of one child who I last saw in the office 02/13/2017. I met him during a hospitalization for atypical chest pain and small bowel obstruction. He had a remote cardiac catheterization at [facility] revealing normal coronary arteries. This was Dr. Vernona Rieger hypertension. He's never had a heart attack or stroke. There is no family history A 2-D echo was entirely normal during his recent consultation as was a Myoview stress test. His small bowel obstruction resolved with conservative therapy. He denies chest pain. He had a uncomplicated left total knee replacement by Dr. Alvan Dame 04/24/16.  He also has a small thoracic aortic aneurysm which we have been following by CTA  Since I saw him a year and a half ago he is done well until Valentine's Day this year when he was admitted overnight for chest pain.  He ruled out for myocardial infarction.  Echocardiogram was essentially normal.  CTA revealed stable thoracic aortic aneurysm measuring 4.4 cm which will follow on annual basis.  He has had no recurrent symptoms.  Current Meds  Medication Sig  . amLODipine (NORVASC) 10 MG tablet TAKE 1 TABLET BY MOUTH DAILY. NEED APPOINTMENT (Patient taking differently: Take 10 mg by mouth daily. )  . aspirin EC 81 MG EC tablet Take 1 tablet (81 mg total) by mouth daily.  [name] atorvastatin (LIPITOR) 40 MG tablet Take 0.5 tablets (20 mg total) by mouth at bedtime.  . carvedilol (COREG) 6.25 MG tablet 1 tab twice daily for BP (Patient taking differently: Take 6.25 mg by mouth 2 (two) times daily. )  . docusate sodium (COLACE) 100 MG capsule Take 200-300 mg by mouth See admin instructions. 369m in the morning and 2075mat night  . losartan-hydrochlorothiazide (HYZAAR)  100-12.5 MG tablet Take 1 tablet by mouth daily.  . Multiple Vitamins-Minerals (MULTIVITAMIN WITH MINERALS) tablet Take 1 tablet by mouth daily.  . Omega-3 Fatty Acids (FISH OIL) 1200 MG CAPS Take 1,200 mg by mouth daily.     No Known Allergies  Social History   Socioeconomic History  . Marital status: Married    Spouse name: Not on file  . Number of children: Not on file  . Years of education: Not on file  . Highest education level: Not on file  Occupational History  . Not on file  Social Needs  . Financial resource strain: Not on file  . Food insecurity:    Worry: Not on file    Inability: Not on file  . Transportation needs:    Medical: Not on file    Non-medical: Not on file  Tobacco Use  . Smoking status: Former SmResearch scientist (life sciences). Smokeless tobacco: Never Used  . Tobacco comment: off and on as a teenager - very little per patient   Substance and Sexual Activity  . Alcohol use: No  . Drug use: No  . Sexual activity: Yes  Lifestyle  . Physical activity:    Days per week: Not on file    Minutes per session: Not on file  . Stress: Not on file  Relationships  . Social connections:    Talks on phone: Not on file    Gets together: Not on file    Attends religious service: Not on file  Active member of club or organization: Not on file    Attends meetings of clubs or organizations: Not on file    Relationship status: Not on file  . Intimate partner violence:    Fear of current or ex partner: Not on file    Emotionally abused: Not on file    Physically abused: Not on file    Forced sexual activity: Not on file  Other Topics Concern  . Not on file  Social History Narrative  . Not on file     Review of Systems: General: negative for chills, fever, night sweats or weight changes.  Cardiovascular: negative for chest pain, dyspnea on exertion, edema, orthopnea, palpitations, paroxysmal nocturnal dyspnea or shortness of breath Dermatological: negative for rash Respiratory:  negative for cough or wheezing Urologic: negative for hematuria Abdominal: negative for nausea, vomiting, diarrhea, bright red blood per rectum, melena, or hematemesis Neurologic: negative for visual changes, syncope, or dizziness All other systems reviewed and are otherwise negative except as noted above.    Blood pressure 140/90, pulse 69, height _0  (1.778 m), weight 297 lb (134.7 kg).  General appearance: alert and no distress Neck: no adenopathy, no carotid bruit, no JVD, supple, symmetrical, trachea midline and thyroid not enlarged, symmetric, no tenderness/mass/nodules Lungs: clear to auscultation bilaterally Heart: regular rate and rhythm, S1, S2 normal, no murmur, click, rub or gallop Extremities: extremities normal, atraumatic, no cyanosis or edema Pulses: 2+ and symmetric Skin: Skin color, texture, turgor normal. No rashes or lesions Neurologically intact  EKG not performed today  ASSESSMENT AND PLAN:   Hypertension History of essential hypertension with blood pressure measured today 140/90.  He is on amlodipine, carvedilol, losartan and hydrochlorothiazide.  He avoids salt.  Hyperlipidemia History of hyperlipidemia on atorvastatin with lipid profile performed 10/12/2018 revealing total cholesterol 187, LDL of 82 and HDL of 46.  Sleep apnea History of obstructive sleep apnea on CPAP  Thoracic aortic aneurysm (HCC) History of small thoracic aortic aneurysm which has remained stable by CTA performed on 10/11/2018 measuring 4.4 cm.  This will be checked on an annual basis.      Lorretta Harp MD FACP,FACC,FAHA, [facility] 10/16/2018 2:46 PM

## 2018-10-17 LAB — BASIC METABOLIC PANEL
BUN/Creatinine Ratio: 23 — ABNORMAL HIGH (ref 9–20)
BUN: 31 mg/dL — ABNORMAL HIGH (ref 6–24)
CO2: 24 mmol/L (ref 20–29)
Calcium: 9.7 mg/dL (ref 8.7–10.2)
Chloride: 101 mmol/L (ref 96–106)
Creatinine, Ser: 1.34 mg/dL — ABNORMAL HIGH (ref 0.76–1.27)
GFR calc Af Amer: 68 mL/min/{1.73_m2} (ref >59)
GFR calc non Af Amer: 59 mL/min/{1.73_m2} — ABNORMAL LOW (ref >59)
Glucose: 78 mg/dL (ref 65–99)
Potassium: 4.1 mmol/L (ref 3.5–5.2)
Sodium: 142 mmol/L (ref 134–144)

## 2018-10-17 LAB — CBC
Hematocrit: 41.4 % (ref 37.5–51.0)
Hemoglobin: 14.9 g/dL (ref 13.0–17.7)
MCH: 33 pg (ref 26.6–33.0)
MCHC: 36 g/dL — ABNORMAL HIGH (ref 31.5–35.7)
MCV: 92 fL (ref 79–97)
Platelets: 156 10*3/uL (ref 150–450)
RBC: 4.51 x10E6/uL (ref 4.14–5.80)
RDW: 12.7 % (ref 11.6–15.4)
WBC: 7 10*3/uL (ref 3.4–10.8)

## 2018-10-29 ENCOUNTER — Inpatient Hospital Stay: Payer: Self-pay | Admitting: Family Medicine

## 2018-11-13 ENCOUNTER — Other Ambulatory Visit: Payer: Self-pay

## 2018-11-13 ENCOUNTER — Other Ambulatory Visit: Payer: Self-pay | Admitting: Cardiovascular Disease

## 2018-11-13 DIAGNOSIS — I10 Essential (primary) hypertension: Secondary | ICD-10-CM

## 2018-11-13 MED ORDER — CARVEDILOL 6.25 MG PO TABS: 6.2500 mg | ORAL_TABLET | Freq: Two times a day (BID) | ORAL | 0 refills | Status: DC

## 2019-01-01 ENCOUNTER — Telehealth: Payer: Self-pay

## 2019-01-01 NOTE — Telephone Encounter (Signed)
Left message for patient letting him that I was calling to get his consent for his upcoming virtual visit. Will send consent info via mychart.

## 2019-01-02 ENCOUNTER — Telehealth: Payer: Self-pay

## 2019-01-02 ENCOUNTER — Telehealth: Payer: BLUE CROSS/BLUE SHIELD | Admitting: Cardiology

## 2019-01-02 DIAGNOSIS — IMO0001 Reserved for inherently not codable concepts without codable children: Secondary | ICD-10-CM | POA: Insufficient documentation

## 2019-01-02 DIAGNOSIS — I10 Essential (primary) hypertension: Principal | ICD-10-CM

## 2019-01-02 DIAGNOSIS — G4730 Sleep apnea, unspecified: Secondary | ICD-10-CM

## 2019-01-02 DIAGNOSIS — E785 Hyperlipidemia, unspecified: Secondary | ICD-10-CM

## 2019-01-02 DIAGNOSIS — Z8719 Personal history of other diseases of the digestive system: Secondary | ICD-10-CM

## 2019-01-02 DIAGNOSIS — Z0389 Encounter for observation for other suspected diseases and conditions ruled out: Secondary | ICD-10-CM

## 2019-01-02 DIAGNOSIS — R079 Chest pain, unspecified: Secondary | ICD-10-CM

## 2019-01-02 DIAGNOSIS — I712 Thoracic aortic aneurysm, without rupture: Secondary | ICD-10-CM

## 2019-01-02 NOTE — Telephone Encounter (Signed)
Called patient to start virtual visit he declined visit and stated he wanted to contact his insurance company first. He states he wants to make sure they would cover his virtual visits. He states he is skepital because some of his coworkers virtual visits were not covered. He states if his visits are covered he will call the office back and reschedule his appt.

## 2019-02-09 ENCOUNTER — Other Ambulatory Visit: Payer: Self-pay | Admitting: Family Medicine

## 2019-02-09 DIAGNOSIS — I10 Essential (primary) hypertension: Secondary | ICD-10-CM

## 2019-02-10 NOTE — Telephone Encounter (Signed)
Patient has cardiologist.  Do you want them to manage and prescribed HTN meds?  Please review and refill if appropriate.  Charyl Bigger, CMA

## 2019-02-11 NOTE — Telephone Encounter (Signed)
refills are not appropriate at this time because patient is way, way overdue for an office visit with me.   Additionally, he was seen in ED/hospital and was told to follow-up with his PCP after that never did.    I will not be prescribing medicines without proper monitoring.  No refill of meds.  Needs tele-med visit

## 2019-02-11 NOTE — Telephone Encounter (Signed)
Called patient and message for patient to call the office back. MPulliam, CMA/RT(R)

## 2019-02-12 NOTE — Telephone Encounter (Signed)
Called patient and made appointment for 02/24/2019.  Patient states that he has enough medication until OV. MPulliam, CMA/RT(R)

## 2019-02-24 ENCOUNTER — Other Ambulatory Visit: Payer: Self-pay

## 2019-02-24 ENCOUNTER — Encounter: Payer: Self-pay | Admitting: Family Medicine

## 2019-02-24 ENCOUNTER — Ambulatory Visit (INDEPENDENT_AMBULATORY_CARE_PROVIDER_SITE_OTHER): Payer: BC Managed Care – PPO | Admitting: Family Medicine

## 2019-02-24 VITALS — BP 142/95 | HR 62 | Temp 98.6°F | Ht 70.0 in | Wt 298.0 lb

## 2019-02-24 DIAGNOSIS — R7303 Prediabetes: Secondary | ICD-10-CM

## 2019-02-24 DIAGNOSIS — R7989 Other specified abnormal findings of blood chemistry: Secondary | ICD-10-CM | POA: Diagnosis not present

## 2019-02-24 DIAGNOSIS — I712 Thoracic aortic aneurysm, without rupture, unspecified: Secondary | ICD-10-CM

## 2019-02-24 DIAGNOSIS — I10 Essential (primary) hypertension: Secondary | ICD-10-CM | POA: Diagnosis not present

## 2019-02-24 DIAGNOSIS — K760 Fatty (change of) liver, not elsewhere classified: Secondary | ICD-10-CM

## 2019-02-24 DIAGNOSIS — E559 Vitamin D deficiency, unspecified: Secondary | ICD-10-CM | POA: Diagnosis not present

## 2019-02-24 MED ORDER — VITAMIN D (ERGOCALCIFEROL) 1.25 MG (50000 UNIT) PO CAPS: ORAL_CAPSULE | ORAL | 3 refills | Status: DC

## 2019-02-24 MED ORDER — CARVEDILOL 12.5 MG PO TABS: 12.5000 mg | ORAL_TABLET | Freq: Two times a day (BID) | ORAL | 1 refills | Status: DC

## 2019-02-24 NOTE — Patient Instructions (Addendum)
Omron series 3 blood pressure monitor- 24.99.      Your goal blood pressure should be 135/85 or less on a regular basis, or medications should be started/ modified.    Normal blood pressure is less than 120/80.    I recommend you check your blood pressure Monday morning, Wednesday afternoon, and Friday evenings.  And throughout the week if you ever feel bad dizzy lightheaded etc.   Hypertension Hypertension, commonly called high blood pressure, is when the force of blood pumping through the arteries is too strong. The arteries are the blood vessels that carry blood from the heart throughout the body. Hypertension forces the heart to work harder to pump blood and may cause arteries to become narrow or stiff. Having untreated or uncontrolled hypertension can cause heart attacks, strokes, kidney disease, and other problems. A blood pressure reading consists of a higher number over a lower number. Ideally, your blood pressure should be below 120/80. The first ("top") number is called the systolic pressure. It is a measure of the pressure in your arteries as your heart beats. The second ("bottom") number is called the diastolic pressure. It is a measure of the pressure in your arteries as the heart relaxes. What are the causes? The cause of this condition is not known. What increases the risk? Some risk factors for high blood pressure are under your control. Others are not. Factors you can change  Smoking.  Having type 2 diabetes mellitus, high cholesterol, or both.  Not getting enough exercise or physical activity.  Being overweight.  Having too much fat, sugar, calories, or salt (sodium) in your diet.  Drinking too much alcohol. Factors that are difficult or impossible to change  Having chronic kidney disease.  Having a family history of high blood pressure.  Age. Risk increases with age.  Race. You may be at higher risk if you are African-American.  Gender. Men are at higher  risk than women before age 57. After age 57, women are at higher risk than men.  Having obstructive sleep apnea.  Stress. What are the signs or symptoms? Extremely high blood pressure (hypertensive crisis) may cause:  Headache.  Anxiety.  Shortness of breath.  Nosebleed.  Nausea and vomiting.  Severe chest pain.  Jerky movements you cannot control (seizures).  How is this diagnosed? This condition is diagnosed by measuring your blood pressure while you are seated, with your arm resting on a surface. The cuff of the blood pressure monitor will be placed directly against the skin of your upper arm at the level of your heart. It should be measured at least twice using the same arm. Certain conditions can cause a difference in blood pressure between your right and left arms. Certain factors can cause blood pressure readings to be lower or higher than normal (elevated) for a short period of time:  When your blood pressure is higher when you are in a health care provider's office than when you are at home, this is called white coat hypertension. Most people with this condition do not need medicines.  When your blood pressure is higher at home than when you are in a health care provider's office, this is called masked hypertension. Most people with this condition may need medicines to control blood pressure.  If you have a high blood pressure reading during one visit or you have normal blood pressure with other risk factors:  You may be asked to return on a different day to have your blood pressure  checked again.  You may be asked to monitor your blood pressure at home for 1 week or longer.  If you are diagnosed with hypertension, you may have other blood or imaging tests to help your health care provider understand your overall risk for other conditions. How is this treated? This condition is treated by making healthy lifestyle changes, such as eating healthy foods, exercising more,  and reducing your alcohol intake. Your health care provider may prescribe medicine if lifestyle changes are not enough to get your blood pressure under control, and if:  Your systolic blood pressure is above 130.  Your diastolic blood pressure is above 80.  Your personal target blood pressure may vary depending on your medical conditions, your age, and other factors. Follow these instructions at home: Eating and drinking  Eat a diet that is high in fiber and potassium, and low in sodium, added sugar, and fat. An example eating plan is called the DASH (Dietary Approaches to Stop Hypertension) diet. To eat this way: ? Eat plenty of fresh fruits and vegetables. Try to fill half of your plate at each meal with fruits and vegetables. ? Eat whole grains, such as whole wheat pasta, brown rice, or whole grain bread. Fill about one quarter of your plate with whole grains. ? Eat or drink low-fat dairy products, such as skim milk or low-fat yogurt. ? Avoid fatty cuts of meat, processed or cured meats, and poultry with skin. Fill about one quarter of your plate with lean proteins, such as fish, chicken without skin, beans, eggs, and tofu. ? Avoid premade and processed foods. These tend to be higher in sodium, added sugar, and fat.  Reduce your daily sodium intake. Most people with hypertension should eat less than 1,500 mg of sodium a day.  Limit alcohol intake to no more than 1 drink a day for nonpregnant women and 2 drinks a day for men. One drink equals 12 oz of beer, 5 oz of wine, or 1 oz of hard liquor. Lifestyle  Work with your health care provider to maintain a healthy body weight or to lose weight. Ask what an ideal weight is for you.  Get at least 30 minutes of exercise that causes your heart to beat faster (aerobic exercise) most days of the week. Activities may include walking, swimming, or biking.  Include exercise to strengthen your muscles (resistance exercise), such as pilates or  lifting weights, as part of your weekly exercise routine. Try to do these types of exercises for 30 minutes at least 3 days a week.  Do not use any products that contain nicotine or tobacco, such as cigarettes and e-cigarettes. If you need help quitting, ask your health care provider.  Monitor your blood pressure at home as told by your health care provider.  Keep all follow-up visits as told by your health care provider. This is important. Medicines  Take over-the-counter and prescription medicines only as told by your health care provider. Follow directions carefully. Blood pressure medicines must be taken as prescribed.  Do not skip doses of blood pressure medicine. Doing this puts you at risk for problems and can make the medicine less effective.  Ask your health care provider about side effects or reactions to medicines that you should watch for. Contact a health care provider if:  You think you are having a reaction to a medicine you are taking.  You have headaches that keep coming back (recurring).  You feel dizzy.  You have swelling in  your ankles.  You have trouble with your vision. Get help right away if:  You develop a severe headache or confusion.  You have unusual weakness or numbness.  You feel faint.  You have severe pain in your chest or abdomen.  You vomit repeatedly.  You have trouble breathing. Summary  Hypertension is when the force of blood pumping through your arteries is too strong. If this condition is not controlled, it may put you at risk for serious complications.  Your personal target blood pressure may vary depending on your medical conditions, your age, and other factors. For most people, a normal blood pressure is less than 120/80.  Hypertension is treated with lifestyle changes, medicines, or a combination of both. Lifestyle changes include weight loss, eating a healthy, low-sodium diet, exercising more, and limiting alcohol. This  information is not intended to replace advice given to you by your health care provider. Make sure you discuss any questions you have with your health care provider. Document Released: 08/14/2005 Document Revised: 07/12/2016 Document Reviewed: 07/12/2016 Elsevier Interactive Patient Education  2018 [employer]    How to Take Your Blood Pressure   Blood pressure is a measurement of how strongly your blood is pressing against the walls of your arteries. Arteries are blood vessels that carry blood from your heart throughout your body. Your health care provider takes your blood pressure at each office visit. You can also take your own blood pressure at home with a blood pressure machine. You may need to take your own blood pressure:  To confirm a diagnosis of high blood pressure (hypertension).  To monitor your blood pressure over time.  To make sure your blood pressure medicine is working.  Supplies needed: To take your blood pressure, you will need a blood pressure machine. You can buy a blood pressure machine, or blood pressure monitor, at most drugstores or online. There are several types of home blood pressure monitors. When choosing one, consider the following:  Choose a monitor that has an arm cuff.  Choose a monitor that wraps snugly around your upper arm. You should be able to fit only one finger between your arm and the cuff.  Do not choose a monitor that measures your blood pressure from your wrist or finger.  Your health care provider can suggest a reliable monitor that will meet your needs. How to prepare To get the most accurate reading, avoid the following for 30 minutes before you check your blood pressure:  Drinking caffeine.  Drinking alcohol.  Eating.  Smoking.  Exercising.  Five minutes before you check your blood pressure:  Empty your bladder.  Sit quietly without talking in a dining chair, rather than in a soft couch or armchair.  How to take your  blood pressure To check your blood pressure, follow the instructions in the manual that came with your blood pressure monitor. If you have a digital blood pressure monitor, the instructions may be as follows: 1. Sit up straight. 2. Place your feet on the floor. Do not cross your ankles or legs. 3. Rest your left arm at the level of your heart on a table or desk or on the arm of a chair. 4. Pull up your shirt sleeve. 5. Wrap the blood pressure cuff around the upper part of your left arm, 1 inch (2.5 cm) above your elbow. It is best to wrap the cuff around bare skin. 6. Fit the cuff snugly around your arm. You should be able to place  only one finger between the cuff and your arm. 7. Position the cord inside the groove of your elbow. 8. Press the power button. 9. Sit quietly while the cuff inflates and deflates. 10. Read the digital reading on the monitor screen and write it down (record it). 11. Wait 2-3 minutes, then repeat the steps, starting at step 1.  What does my blood pressure reading mean? A blood pressure reading consists of a higher number over a lower number. Ideally, your blood pressure should be below 120/80. The first ("top") number is called the systolic pressure. It is a measure of the pressure in your arteries as your heart beats. The second ("bottom") number is called the diastolic pressure. It is a measure of the pressure in your arteries as the heart relaxes. Blood pressure is classified into four stages. The following are the stages for adults who do not have a short-term serious illness or a chronic condition. Systolic pressure and diastolic pressure are measured in a unit called mm Hg. Normal  Systolic pressure: below 272.  Diastolic pressure: below 80. Elevated  Systolic pressure: 536-644.  Diastolic pressure: below 80. Hypertension stage 1  Systolic pressure: 034-742.  Diastolic pressure: 59-56. Hypertension stage 2  Systolic pressure: 387 or  above.  Diastolic pressure: 90 or above. You can have prehypertension or hypertension even if only the systolic or only the diastolic number in your reading is higher than normal. Follow these instructions at home:  Check your blood pressure as often as recommended by your health care provider.  Take your monitor to the next appointment with your health care provider to make sure: ? That you are using it correctly. ? That it provides accurate readings.  Be sure you understand what your goal blood pressure numbers are.  Tell your health care provider if you are having any side effects from blood pressure medicine. Contact a health care provider if:  Your blood pressure is consistently high. Get help right away if:  Your systolic blood pressure is higher than 180.  Your diastolic blood pressure is higher than 110. This information is not intended to replace advice given to you by your health care provider. Make sure you discuss any questions you have with your health care provider. Document Released: 01/21/2016 Document Revised: 04/04/2016 Document Reviewed: 01/21/2016 Elsevier Interactive Patient Education  2018 [employer].     Thoracic Aortic Aneurysm  An aneurysm is a bulge in an artery. It happens when blood pushes up against a weakened or damaged artery wall. A thoracic aortic aneurysm is an aneurysm that occurs in the first part of the aorta, between the heart and the diaphragm. The aorta is the main artery of the body. It supplies blood from the heart to the rest of the body. Some aneurysms may not cause symptoms or problems. However, a thoracic aortic aneurysm can cause two serious problems: It can enlarge and burst (rupture). It can cause blood to flow between the layers of the wall of the aorta through a tear (aortic dissection). Both of these problems are medical emergencies. They can cause bleeding inside the body and can be life-threatening if they are not diagnosed  and treated right away. What are the causes? The exact cause of this condition is not known. What increases the risk? The following factors may make you more likely to develop this condition: Being 82 years of age or older. Having a family history of aneurysms. Using tobacco. Having any of these conditions: Hardening of the  arteries caused by the buildup of fat and other substances in the lining of a blood vessel (arteriosclerosis). Inflammation of the walls of an artery (arteritis). A genetic disease that weakens the body's connective tissue, such as Marfan syndrome. An injury or trauma to the aorta. High blood pressure (hypertension). High cholesterol. An infection from bacteria, such as syphilis or staphylococcus, in the wall of the aorta (infectious aortitis). What are the signs or symptoms? Symptoms of this condition vary depending on the size of the aneurysm and how fast it is growing. Most grow slowly and do not cause symptoms. When symptoms do occur, they may include: Pain in the chest, back, sides, or abdomen. The pain may vary in intensity. Sudden, severe pain may indicate that the aneurysm has ruptured. Hoarseness. Cough. Shortness of breath. Swallowing problems. Swelling in the face, arms, or legs. Fever. Unexplained weight loss. How is this diagnosed? This condition may be diagnosed with: An ultrasound. X-rays. CT scan. MRI. A test to check the arteries for damage or blockages (angiogram). Most unruptured thoracic aortic aneurysms cause no symptoms, so they are often found during exams for other conditions. How is this treated? Treatment for this condition depends on: The size of the aneurysm. How fast the aneurysm is growing. Your age. Risk factors for rupture. Small aneurysms (2.2 inches, or 5.5 cm, or less) may be managed with: Medicines to: Control blood pressure. Manage pain. Fight infection. Regular monitoring. This may include an ultrasound or CT scan  every year or every 6 months to see if the aneurysm is getting bigger. Large or fast-growing aneurysms may be treated with surgery. Follow these instructions at home: Eating and drinking  Eat a healthy diet. Your health care provider may recommend that you: Lower your salt (sodium) intake. In some people, too much salt can raise blood pressure and increase the risk for thoracic aortic aneurysm. Avoid foods that are high in saturated fat and cholesterol, such as red meat and full-fat dairy. Eat a diet that is low in sugar. Increase your fiber intake by including whole grains, vegetables, and fruits in your diet. Eating these foods may help to lower your blood pressure. Do not drink alcohol if your health care provider tells you not to drink. If you drink alcohol: Limit how much you use to: 0-1 drink a day for women. 0-2 drinks a day for men. Be aware of how much alcohol is in your drink. In the [place] one drink equals one 12 oz bottle of beer (355 mL), one 5 oz glass of wine (148 mL), or one 1 oz glass of hard liquor (44 mL). Lifestyle Do not use any products that contain nicotine or tobacco, such as cigarettes, e-cigarettes, and chewing tobacco. If you need help quitting, ask your health care provider. Maintain a healthy weight. Check your blood pressure regularly. Follow your health care provider's instructions on how to keep your blood pressure within normal limits. Have your blood sugar (glucose) level and cholesterol levels checked regularly. Follow your health care provider's instructions on how to keep levels within normal limits. Activity  Stay physically active and exercise regularly. Talk with your health care provider about how often you should exercise and ask which types of exercise are best for you. Avoid heavy lifting and activities that take a lot of effort. Ask your health care provider what activities are safe for you. General instructions Take over-the-counter and  prescription medicines only as told by your health care provider. Talk with your health care  provider about regular screenings to see if the aneurysm is getting bigger. Keep all follow-up visits as told by your health care provider. This is important. Contact a health care provider if you have: Unexplained weight loss. Get help right away if you have: Pain in your upper back, neck, or abdomen. This pain may move into your chest and arms. Trouble swallowing. A cough or hoarseness. Shortness of breath. Summary A thoracic aortic aneurysm is an aneurysm that occurs in the first part of the aorta, between the heart and the diaphragm. As a thoracic aortic aneurysm becomes larger, it can burst (rupture), or blood can flow between the layers of the wall of the aorta through a tear (aorticdissection). These conditions can be life-threatening if they are not diagnosed and treated right away. If you have a thoracic aortic aneurysm, its growth will be closely monitored. Surgical repair may be needed for larger or faster-growing aneurysms. This information is not intended to replace advice given to you by your health care provider. Make sure you discuss any questions you have with your health care provider. Document Released: 08/14/2005 Document Revised: 04/02/2018 Document Reviewed: 04/03/2018 Elsevier Patient Education  2020 [employer].

## 2019-02-24 NOTE — Progress Notes (Signed)
Impression and Recommendations:    1. Essential hypertension- poorly controlled   2. Morbid obesity (HCC)   3. h/o elevated LFT's- dx of Fatty liver disease, nonalcoholic   4. Elevated serum creatinine   5. Pre-diabetes   6. Vitamin D deficiency   7. Essential hypertension   8. Thoracic aortic aneurysm without rupture (HCC)      Essential hypertension- poorly controlled - Plan: Comprehensive metabolic panel, carvedilol (COREG) 12.5 MG tablet, -Poorly controlled blood pressure will increase Coreg dose.  Needs to have home blood pressure monitoring and intensive dietary lifestyle modification addition to meds was discussed with patient.  Extensive counseling done regarding this.  Morbid obesity (HCC) - Plan: My counseling done.  Needs weight loss.  h/o elevated LFT's- dx of Fatty liver disease, nonalcoholic - Plan: Comprehensive metabolic panel, counseled patient on reasons for elevated liver enzymes.  Will continue to monitor.  Avoidance of hepatotoxic substances discussed with patient, and counseling done  Elevated serum creatinine - Plan: Comprehensive metabolic panel, counseling done.  We will continue to monitor.  Discussed with patient reasons for this.  Pre-diabetes - Plan: Hemoglobin A1c, counseling done we will continue to monitor  Vitamin D deficiency - Plan: VITAMIN D 25 Hydroxy (Vit-D Deficiency, Fractures), Vitamin D, Ergocalciferol, (DRISDOL) 1.25 MG (50000 UT) CAPS capsule, treatment rendered, counseling done we will continue to monitor  Thoracic aortic aneurysm without rupture (HCC) - Plan: carvedilol (COREG) 12.5 MG tablet, -Extensive counseling done with patient regarding importance of blood pressure control with this history.  I printed off articles from Internet as well as AVS for patient to better educate himself on importance of cholesterol and blood pressure management with this diagnosis.  Pt was interviewed and evaluated by me in the clinic today for  32.5+ minutes, with over 50% time spent in face to face counseling of patients various medical conditions, treatment plans of those medical conditions including medicine management and lifestyle modification, strategies to improve health and well being; and in coordination of care. SEE ABOVE TREATMENT PLAN FOR DETAILS   Orders Placed This Encounter  Procedures  . Comprehensive metabolic panel  . Hemoglobin A1c  . VITAMIN D 25 Hydroxy (Vit-D Deficiency, Fractures)  . Specimen status report    Meds ordered this encounter  Medications  . Vitamin D, Ergocalciferol, (DRISDOL) 1.25 MG (50000 UT) CAPS capsule    Sig: Take one tablet wkly    Dispense:  12 capsule    Refill:  3  . carvedilol (COREG) 12.5 MG tablet    Sig: Take 1 tablet (12.5 mg total) by mouth 2 (two) times daily.    Dispense:  180 tablet    Refill:  1    Medications Discontinued During This Encounter  Medication Reason  . carvedilol (COREG) 6.25 MG tablet      Gross side effects, risk and benefits, and alternatives of medications and treatment plan in general discussed with patient.  Patient is aware that all medications have potential side effects and we are unable to predict every side effect or drug-drug interaction that may occur.   Patient will call with any questions prior to using medication if they have concerns.    Expresses verbal understanding and consents to current therapy and treatment regimen.  No barriers to understanding were identified.  Red flag symptoms and signs discussed in detail.  Patient expressed understanding regarding what to do in case of emergency\urgent symptoms  Please see AVS handed out to patient at the end  of our visit for further patient instructions/ counseling done pertaining to today's office visit.   Return for 4wks CMA only BP check; then ov 44mo with me- doubled coreg dose.   -Patient knows to follow-up sooner than planned if blood pressure is not at goal of continuously less than  135/85  Note:  This note was prepared with assistance of Dragon voice recognition software. Occasional wrong-word or sound-a-like substitutions may have occurred due to the inherent limitations of voice recognition software.   02/24/2019 Carlye Grippeeborah J Lonzell Dorris, DO 6:40 PM   --------------------------------------------------------------------------------------------------------------------------------------------------------------------------------------------------------------------------------------------    Subjective:     HPI: Keith Vance is a 58 y.o. male who [facility] today for issues as discussed below.    Saw Dr Allyson SabalBerry-  After seen in hosp for CP aorund Valentines Day.  Last OV was Sept - we changed lsiniopril /hctz to losartan -hctz.  Pt w/o s-e.  Was able to check bp initially - was around 140-150/ 80-90's. Had thoracic aneuysm-last had imaging of CT [place] chest on 10/11/2018=  Mild fusiform aneurysmal dilatation of the ascending thoracic aorta with a maximal diameter of 4.4 cm compared to 02/20/2017. -This is being monitored by Dr. Gery PrayBarry yearly.  Wt Readings from Last 3 Encounters:  02/24/19 298 lb (135.2 kg)  10/16/18 297 lb (134.7 kg)  10/11/18 288 lb 13.6 oz (131 kg)   BP Readings from Last 3 Encounters:  02/24/19 (!) 142/95  10/16/18 140/90  10/12/18 (!) 151/99   Pulse Readings from Last 3 Encounters:  02/24/19 62  10/16/18 69  10/12/18 61   BMI Readings from Last 3 Encounters:  02/24/19 42.76 kg/m  10/16/18 42.62 kg/m  10/11/18 41.45 kg/m     Patient Care Team    Relationship Specialty Notifications Start End  Thomasene Lotpalski, Tajah Schreiner, DO PCP - General Family Medicine  02/27/18   Runell GessBerry, Jonathan J, MD Consulting Physician Cardiology  02/27/18   Wynetta Emerywens, William S Jr., MD Consulting Physician Orthopedic Surgery  02/27/18   Durene Romanslin, Matthew, MD Consulting Physician Orthopedic Surgery  02/27/18      Patient Active Problem List    Diagnosis Date Noted  . NAFLD (nonalcoholic fatty liver disease) 16/10/960409/07/2018    Priority: High  . Morbid obesity (HCC) 02/27/2018    Priority: High  . Dyslipidemia, goal LDL below 100 02/27/2015    Priority: High  . h/o elevated LFT's- dx of Fatty liver disease, nonalcoholic 02/27/2018    Priority: Medium  . Thoracic aortic aneurysm (HCC) 01/07/2016    Priority: Medium  . Elevated LFTs 03/07/2012    Priority: Medium  . Sleep apnea 03/07/2012    Priority: Medium  . S/P knee replacement 04/24/2016    Priority: Low  . Leukocytosis 02/27/2015    Priority: Low  . Elevated serum creatinine 02/24/2019  . Pre-diabetes 02/24/2019  . Vitamin D deficiency 02/24/2019  . Normal coronary arteries 01/02/2019  . Elevated triglycerides with high cholesterol 03/18/2018  . Primary osteoarthritis of left knee 07/30/2017  . 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 S/P left TKA 04/24/2016  . Midsternal chest pain 11/14/2015  . Essential hypertension 11/14/2015  . Chest pain 11/11/2015  . History of small bowel obstruction 02/27/2015  . GI problem 03/07/2012    Past Medical history, Surgical history, Family history, Social history, Allergies and Medications have been entered into the medical record, reviewed and changed as needed.    Current Meds  Medication Sig  . amLODipine (NORVASC)  10 MG tablet TAKE 1 TABLET BY MOUTH DAILY, NEEDS APPOINTMENT  . aspirin EC 81 MG EC tablet Take 1 tablet (81 mg total) by mouth daily.  [name] atorvastatin (LIPITOR) 40 MG tablet Take 0.5 tablets (20 mg total) by mouth at bedtime.  . carvedilol (COREG) 12.5 MG tablet Take 1 tablet (12.5 mg total) by mouth 2 (two) times daily.  [name] docusate sodium (COLACE) 100 MG capsule Take 200-300 mg by mouth See admin instructions. 300mg  in the morning and 200mg  at night  . losartan-hydrochlorothiazide (HYZAAR) 100-12.5 MG tablet Take 1 tablet by mouth daily.  . Multiple Vitamins-Minerals (MULTIVITAMIN  WITH MINERALS) tablet Take 1 tablet by mouth daily.  . Omega-3 Fatty Acids (FISH OIL) 1200 MG CAPS Take 1,200 mg by mouth daily.  . [DISCONTINUED] carvedilol (COREG) 6.25 MG tablet Take 1 tablet (6.25 mg total) by mouth 2 (two) times daily.    Allergies:  No Known Allergies   Review of Systems:  A fourteen system review of systems was performed and found to be positive as per HPI.   Objective:   Blood pressure (!) 142/95, pulse 62, temperature 98.6 F (37 C), height 5\' 10"  (1.778 m), weight 298 lb (135.2 kg), SpO2 96 %. Body mass index is 42.76 kg/m. General:  Well Developed, well nourished, appropriate for stated age.  Neuro:  Alert and oriented,  extra-ocular muscles intact  HEENT:  Normocephalic, atraumatic, neck supple, no carotid bruits appreciated  Skin:  no gross rash, warm, pink. Cardiac:  RRR, S1 S2 Respiratory:  ECTA B/L and A/P, Not using accessory muscles, speaking in full sentences- unlabored. Vascular:  Ext warm, no cyanosis apprec.; cap RF less 2 sec. Psych:  No HI/SI, judgement and insight good, Euthymic mood. Full Affect.

## 2019-02-25 LAB — COMPREHENSIVE METABOLIC PANEL
ALT: 92 IU/L — ABNORMAL HIGH (ref 0–44)
AST: 56 IU/L — ABNORMAL HIGH (ref 0–40)
Albumin/Globulin Ratio: 1.6 (ref 1.2–2.2)
Albumin: 4.7 g/dL (ref 3.8–4.9)
Alkaline Phosphatase: 80 IU/L (ref 39–117)
BUN/Creatinine Ratio: 20 (ref 9–20)
BUN: 26 mg/dL — ABNORMAL HIGH (ref 6–24)
Bilirubin Total: 0.7 mg/dL (ref 0.0–1.2)
CO2: 23 mmol/L (ref 20–29)
Calcium: 9.4 mg/dL (ref 8.7–10.2)
Chloride: 100 mmol/L (ref 96–106)
Creatinine, Ser: 1.28 mg/dL — ABNORMAL HIGH (ref 0.76–1.27)
GFR calc Af Amer: 71 mL/min/{1.73_m2} (ref >59)
GFR calc non Af Amer: 62 mL/min/{1.73_m2} (ref >59)
Globulin, Total: 3 g/dL (ref 1.5–4.5)
Glucose: 91 mg/dL (ref 65–99)
Potassium: 3.8 mmol/L (ref 3.5–5.2)
Sodium: 143 mmol/L (ref 134–144)
Total Protein: 7.7 g/dL (ref 6.0–8.5)

## 2019-02-25 LAB — VITAMIN D 25 HYDROXY (VIT D DEFICIENCY, FRACTURES): Vit D, 25-Hydroxy: 24.8 ng/mL — ABNORMAL LOW (ref 30.0–100.0)

## 2019-02-26 LAB — SPECIMEN STATUS REPORT

## 2019-02-26 LAB — HEMOGLOBIN A1C
Est. average glucose Bld gHb Est-mCnc: 114 mg/dL
Hgb A1c MFr Bld: 5.6 % (ref 4.8–5.6)

## 2019-03-03 ENCOUNTER — Other Ambulatory Visit: Payer: Self-pay

## 2019-03-03 ENCOUNTER — Other Ambulatory Visit: Payer: BC Managed Care – PPO

## 2019-03-03 NOTE — Progress Notes (Signed)
Pt here for blood pressure check only.  BP noted in vital signs.  Charyl Bigger, CMA

## 2019-03-13 DIAGNOSIS — M1711 Unilateral primary osteoarthritis, right knee: Secondary | ICD-10-CM | POA: Diagnosis not present

## 2019-03-13 DIAGNOSIS — M1712 Unilateral primary osteoarthritis, left knee: Secondary | ICD-10-CM | POA: Diagnosis not present

## 2019-03-13 DIAGNOSIS — Z96652 Presence of left artificial knee joint: Secondary | ICD-10-CM | POA: Diagnosis not present

## 2019-03-13 DIAGNOSIS — Z09 Encounter for follow-up examination after completed treatment for conditions other than malignant neoplasm: Secondary | ICD-10-CM | POA: Diagnosis not present

## 2019-04-03 DIAGNOSIS — M1711 Unilateral primary osteoarthritis, right knee: Secondary | ICD-10-CM | POA: Diagnosis not present

## 2019-04-03 DIAGNOSIS — M25562 Pain in left knee: Secondary | ICD-10-CM | POA: Diagnosis not present

## 2019-05-06 ENCOUNTER — Other Ambulatory Visit: Payer: Self-pay

## 2019-05-06 DIAGNOSIS — I10 Essential (primary) hypertension: Secondary | ICD-10-CM

## 2019-05-06 MED ORDER — LOSARTAN POTASSIUM-HCTZ 100-12.5 MG PO TABS: 1.0000 | ORAL_TABLET | Freq: Every day | ORAL | 0 refills | Status: DC

## 2019-06-03 ENCOUNTER — Ambulatory Visit: Payer: BC Managed Care – PPO | Admitting: Family Medicine

## 2019-06-05 ENCOUNTER — Other Ambulatory Visit: Payer: Self-pay

## 2019-06-05 DIAGNOSIS — Z20828 Contact with and (suspected) exposure to other viral communicable diseases: Secondary | ICD-10-CM | POA: Diagnosis not present

## 2019-06-05 DIAGNOSIS — Z20822 Contact with and (suspected) exposure to covid-19: Secondary | ICD-10-CM

## 2019-06-06 LAB — NOVEL CORONAVIRUS, NAA: SARS-CoV-2, NAA: NOT DETECTED

## 2019-08-02 ENCOUNTER — Other Ambulatory Visit: Payer: Self-pay | Admitting: Family Medicine

## 2019-08-02 DIAGNOSIS — I10 Essential (primary) hypertension: Secondary | ICD-10-CM

## 2019-08-04 ENCOUNTER — Other Ambulatory Visit: Payer: Self-pay | Admitting: Family Medicine

## 2019-08-04 DIAGNOSIS — I10 Essential (primary) hypertension: Secondary | ICD-10-CM

## 2019-09-18 ENCOUNTER — Ambulatory Visit: Payer: BC Managed Care – PPO | Attending: Internal Medicine

## 2019-09-18 DIAGNOSIS — Z23 Encounter for immunization: Secondary | ICD-10-CM | POA: Insufficient documentation

## 2019-09-18 NOTE — Progress Notes (Signed)
   Covid-19 Vaccination Clinic  Name:  Keith Vance    MRN: [id number] DOB: [birth year]  09/18/2019  Mr. Keith Vance was observed post Covid-19 immunization for 15 minutes without incidence. He was provided with Vaccine Information Sheet and instruction to access the V-Safe system.   Mr. Keith Vance was instructed to call 911 with any severe reactions post vaccine: [name] Difficulty breathing  . Swelling of your face and throat  . A fast heartbeat  . A bad rash all over your body  . Dizziness and weakness    Immunizations Administered    Name Date Dose VIS Date Route   Pfizer COVID-19 Vaccine 09/18/2019  2:10 PM 0.3 mL 08/08/2019 Intramuscular   Manufacturer: [employer], [employer]   Lot: [id number]   NDC: [address]-1

## 2019-10-09 ENCOUNTER — Ambulatory Visit: Payer: BC Managed Care – PPO | Attending: Internal Medicine

## 2019-10-09 DIAGNOSIS — Z23 Encounter for immunization: Secondary | ICD-10-CM | POA: Insufficient documentation

## 2019-10-09 NOTE — Progress Notes (Signed)
   Covid-19 Vaccination Clinic  Name:  Keith Vance    MRN: [id number] DOB: 1961/07/18  10/09/2019  Mr. [name] was observed post Covid-19 immunization for 15 minutes without incidence. He was provided with Vaccine Information Sheet and instruction to access the V-Safe system.   Mr. [name] was instructed to call 911 with any severe reactions post vaccine: [name] Difficulty breathing  . Swelling of your face and throat  . A fast heartbeat  . A bad rash all over your body  . Dizziness and weakness    Immunizations Administered    Name Date Dose VIS Date Route   Pfizer COVID-19 Vaccine 10/09/2019  2:02 PM 0.3 mL 08/08/2019 Intramuscular   Manufacturer: [employer], [employer]   Lot: [id number]   NDC: [address]-3

## 2019-10-22 ENCOUNTER — Telehealth: Payer: Self-pay | Admitting: Cardiology

## 2019-10-22 ENCOUNTER — Other Ambulatory Visit: Payer: Self-pay

## 2019-10-22 ENCOUNTER — Ambulatory Visit: Payer: BC Managed Care – PPO | Admitting: Cardiovascular Disease

## 2019-10-22 ENCOUNTER — Encounter: Payer: Self-pay | Admitting: Cardiovascular Disease

## 2019-10-22 VITALS — BP 125/82 | HR 65 | Temp 96.9°F | Ht 70.0 in | Wt 295.6 lb

## 2019-10-22 DIAGNOSIS — I712 Thoracic aortic aneurysm, without rupture, unspecified: Secondary | ICD-10-CM

## 2019-10-22 DIAGNOSIS — R0602 Shortness of breath: Secondary | ICD-10-CM

## 2019-10-22 DIAGNOSIS — I10 Essential (primary) hypertension: Secondary | ICD-10-CM | POA: Diagnosis not present

## 2019-10-22 DIAGNOSIS — E785 Hyperlipidemia, unspecified: Secondary | ICD-10-CM

## 2019-10-22 DIAGNOSIS — G4730 Sleep apnea, unspecified: Secondary | ICD-10-CM

## 2019-10-22 MED ORDER — METOPROLOL TARTRATE 100 MG PO TABS: ORAL_TABLET | ORAL | 0 refills | Status: DC

## 2019-10-22 NOTE — Progress Notes (Signed)
10/22/2019 Keith Vance   Oct 26, 1960  [id number]  Primary Physician Mellody Dance, DO Primary Cardiologist: Lorretta Harp MD Garret Reddish, [place], [place]  HPI:  Keith Vance is a 59 y.o.   severely overweight married Caucasian male father of one Doristine Section I last saw in the office  10/16/2018.I met him during a hospitalization for atypical chest pain and small bowel obstruction. He had a remote cardiac catheterization at [facility] revealing normal coronary arteries. This was Dr. Vernona Rieger hypertension. He's never had a heart attack or stroke. There is no family history A 2-D echo was entirely normal during his recent consultation as was a Myoview stress test. His small bowel obstruction resolved with conservative therapy. He denies chest pain. He had a uncomplicated left total knee replacement by Dr. Alvan Dame 04/24/16.  He also has a small thoracic aortic aneurysm which we have been following by CTA  He was admitted overnight on Valentine's Day 2019 for chest pain.  He ruled out for myocardial infarction.  Echocardiogram was essentially normal.  CTA revealed stable thoracic aortic aneurysm measuring 4.4 cm which will follow on annual basis.  Since I saw him a year ago he is continue to do well.  He is on a diet and has lost 20 pounds.  He denies chest pain or shortness of breath.  His last CTA performed 10/11/2018 revealed a stable thoracic aortic aneurysm measuring 4.4 cm.  He does wear CPAP for his obstructive sleep apnea.   Current Meds  Medication Sig  . amLODipine (NORVASC) 10 MG tablet TAKE 1 TABLET BY MOUTH DAILY, NEEDS APPOINTMENT  . aspirin EC 81 MG EC tablet Take 1 tablet (81 mg total) by mouth daily.  [name] atorvastatin (LIPITOR) 40 MG tablet Take 0.5 tablets (20 mg total) by mouth at bedtime.  . carvedilol (COREG) 12.5 MG tablet Take 1 tablet (12.5 mg total) by mouth 2 (two) times daily.  [name] docusate sodium (COLACE) 100 MG capsule Take 200-300 mg by mouth See  admin instructions. 317m in the morning and 2067mat night  . losartan-hydrochlorothiazide (HYZAAR) 100-12.5 MG tablet Take 1 tablet by mouth daily. **PATIENT NEEDS APT FOR ANY FURTHER REFILLS**  . Multiple Vitamins-Minerals (MULTIVITAMIN WITH MINERALS) tablet Take 1 tablet by mouth daily.  . Omega-3 Fatty Acids (FISH OIL) 1200 MG CAPS Take 1,200 mg by mouth daily.  . Vitamin D, Ergocalciferol, (DRISDOL) 1.25 MG (50000 UT) CAPS capsule Take one tablet wkly     No Known Allergies  Social History   Socioeconomic History  . Marital status: Married    Spouse name: Not on file  . Number of children: Not on file  . Years of education: Not on file  . Highest education level: Not on file  Occupational History  . Not on file  Tobacco Use  . Smoking status: Former SmResearch scientist (life sciences). Smokeless tobacco: Never Used  . Tobacco comment: off and on as a teenager - very little per patient   Substance and Sexual Activity  . Alcohol use: No  . Drug use: No  . Sexual activity: Yes  Other Topics Concern  . Not on file  Social History Narrative  . Not on file   Social Determinants of Health   Financial Resource Strain:   . Difficulty of Paying Living Expenses: Not on file  Food Insecurity:   . Worried About RuCharity fundraisern the Last Year: Not on file  . Ran Out of Food in  the Last Year: Not on file  Transportation Needs:   . Lack of Transportation (Medical): Not on file  . Lack of Transportation (Non-Medical): Not on file  Physical Activity:   . Days of Exercise per Week: Not on file  . Minutes of Exercise per Session: Not on file  Stress:   . Feeling of Stress : Not on file  Social Connections:   . Frequency of Communication with Friends and Family: Not on file  . Frequency of Social Gatherings with Friends and Family: Not on file  . Attends Religious Services: Not on file  . Active Member of Clubs or Organizations: Not on file  . Attends Archivist Meetings: Not on file  .  Marital Status: Not on file  Intimate Partner Violence:   . Fear of Current or Ex-Partner: Not on file  . Emotionally Abused: Not on file  . Physically Abused: Not on file  . Sexually Abused: Not on file     Review of Systems: General: negative for chills, fever, night sweats or weight changes.  Cardiovascular: negative for chest pain, dyspnea on exertion, edema, orthopnea, palpitations, paroxysmal nocturnal dyspnea or shortness of breath Dermatological: negative for rash Respiratory: negative for cough or wheezing Urologic: negative for hematuria Abdominal: negative for nausea, vomiting, diarrhea, bright red blood per rectum, melena, or hematemesis Neurologic: negative for visual changes, syncope, or dizziness All other systems reviewed and are otherwise negative except as noted above.    Blood pressure 125/82, pulse 65, temperature (!) 96.9 F (36.1 C), height 5' 10"  (1.778 m), weight 295 lb 9.6 oz (134.1 kg), SpO2 93 %.  General appearance: alert and no distress Neck: no adenopathy, no carotid bruit, no JVD, supple, symmetrical, trachea midline and thyroid not enlarged, symmetric, no tenderness/mass/nodules Lungs: clear to auscultation bilaterally Heart: regular rate and rhythm, S1, S2 normal, no murmur, click, rub or gallop Extremities: extremities normal, atraumatic, no cyanosis or edema Pulses: 2+ and symmetric Skin: Skin color, texture, turgor normal. No rashes or lesions Neurologic: Alert and oriented X 3, normal strength and tone. Normal symmetric reflexes. Normal coordination and gait  EKG normal sinus rhythm at 60 with left anterior fascicular block.  I personally reviewed this EKG.  ASSESSMENT AND PLAN:   Dyslipidemia, goal LDL below 100 History of dyslipidemia on atorvastatin.[name]   His last lipid profile performed 10/12/2018 revealed total cholesterol of 187, LDL of 82 and HDL 46.  We will repeat a fasting lipid profile.  Essential hypertension History of essential  hypertension blood pressure measured today 125/82.  He is on amlodipine, carvedilol, losartan and hydrochlorothiazide.  Sleep apnea History of obstructive sleep apnea on CPAP which she benefits from  Thoracic aortic aneurysm [facility]) History of thoracic aortic aneurysm measuring 4.4 cm by CTA performed 10/11/2018.  This is stable compared to his previous CTA performed 2 years before.  We will recheck a chest CTA      Lorretta Harp MD [facility], [facility] 10/22/2019 9:55 AM

## 2019-10-22 NOTE — Assessment & Plan Note (Addendum)
History of dyslipidemia on atorvastatin.Keith Vance   His last lipid profile performed 10/12/2018 revealed total cholesterol of 187, LDL of 82 and HDL 46.  We will repeat a fasting lipid profile.

## 2019-10-22 NOTE — Patient Instructions (Addendum)
Medication Instructions:  Take 100mg  Metoprolol 2 hours prior to CTA  If you need a refill on your cardiac medications before your next appointment, please call your pharmacy.   Lab work: Fasting Lipids and Hepatic Function, BMET If you have labs (blood work) drawn today and your tests are completely normal, you will receive your results only by: MyChart Message (if you have MyChart) OR A paper copy in the mail If you have any lab test that is abnormal or we need to change your treatment, we will call you to review the results.  Testing/Procedures: Non-Cardiac CT scanning, (CAT scanning), is a noninvasive, special x-ray that produces cross-sectional images of the body using x-rays and a computer. CT scans help physicians diagnose and treat medical conditions. For some CT exams, a contrast material is used to enhance visibility in the area of the body being studied. CT scans provide greater clarity and reveal more details than regular x-ray exams.   Follow-Up: At [facility], you and your health needs are our priority.  As part of our continuing mission to provide you with exceptional heart care, we have created designated Provider Care Teams.  These Care Teams include your primary Cardiologist (physician) and Advanced Practice Providers (APPs -  Physician Assistants and Nurse Practitioners) who all work together to provide you with the care you need, when you need it. You may see Dr. [facility] or one of the following Advanced Practice Providers on your designated Care Team:    Kharee Lesesne, PA-C  [place], [place]  [place], Edd Fabian  Your physician wants you to follow-up in: 1 year with Dr. [place]. You will receive a reminder letter in the mail two months in advance. If you don't receive a letter, please call our office to schedule the follow-up appointment.  Any Other Special Instructions Will Be Listed Below (If Applicable).  Your cardiac CT will be scheduled at:   [facility] [address] [place], [place] [phone]   Please arrive at the [facility] main entrance of [facility] 30 minutes prior to test start time. Proceed to the [facility] Radiology Department (first floor) to check-in and test prep.  Please follow these instructions carefully (unless otherwise directed):  Hold all erectile dysfunction medications at least 3 days (72 hrs) prior to test.  On the Night Before the Test: . Be sure to Drink plenty of water. . Do not consume any caffeinated/decaffeinated beverages or chocolate 12 hours prior to your test. . Do not take any antihistamines 12 hours prior to your test.  On the Day of the Test: . Drink plenty of water. Do not drink any water within one hour of the test. . Do not eat any food 4 hours prior to the test. . You may take your regular medications prior to the test.  . Take metoprolol (Lopressor) two hours prior to test. . HOLD Furosemide/Hydrochlorothiazide morning of the test.       After the Test: . Drink plenty of water. . After receiving IV contrast, you may experience a mild flushed feeling. This is normal. . On occasion, you may experience a mild rash up to 24 hours after the test. This is not dangerous. If this occurs, you can take Benadryl 25 mg and increase your fluid intake. . If you experience trouble breathing, this can be serious. If it is severe call 911 IMMEDIATELY. If it is mild, please call our office. . If you take any of  these medications: Glipizide/Metformin, Avandament, Glucavance, please do not take 48 hours after completing test unless otherwise instructed.   Once we have confirmed authorization from your insurance company, we will call you to set up a date and time for your test.   For non-scheduling related questions, please contact the cardiac imaging nurse navigator should you have any questions/concerns: Marchia Bond, RN Navigator Cardiac Imaging [facility] [phone] mobile

## 2019-10-22 NOTE — Assessment & Plan Note (Signed)
History of obstructive sleep apnea on CPAP which she benefits from 

## 2019-10-22 NOTE — Assessment & Plan Note (Signed)
History of thoracic aortic aneurysm measuring 4.4 cm by CTA performed 10/11/2018.  This is stable compared to his previous CTA performed 2 years before.  We will recheck a chest CTA

## 2019-10-22 NOTE — Assessment & Plan Note (Signed)
History of essential hypertension blood pressure measured today 125/82.  He is on amlodipine, carvedilol, losartan and hydrochlorothiazide.

## 2019-10-28 DIAGNOSIS — M1711 Unilateral primary osteoarthritis, right knee: Secondary | ICD-10-CM | POA: Diagnosis not present

## 2019-11-21 ENCOUNTER — Other Ambulatory Visit: Payer: Self-pay | Admitting: Family Medicine

## 2019-11-21 DIAGNOSIS — I10 Essential (primary) hypertension: Secondary | ICD-10-CM

## 2019-11-25 ENCOUNTER — Telehealth (HOSPITAL_COMMUNITY): Payer: Self-pay | Admitting: Emergency Medicine

## 2019-11-25 ENCOUNTER — Encounter (HOSPITAL_COMMUNITY): Payer: Self-pay

## 2019-11-25 NOTE — Telephone Encounter (Signed)
Reaching out to patient to offer assistance regarding upcoming cardiac imaging study; pt verbalizes understanding of appt date/time, parking situation and where to check in, pre-test NPO status and medications ordered, and verified current allergies; name and call back number provided for further questions should they arise Keith Alexandria RN Navigator Cardiac Imaging [facility] [phone] office [phone] cell   Pt reminded to pick up PO metoprolol at pharm and take 2 hr prior to scan. Pt appreciated the call  Keith Vance

## 2019-11-26 ENCOUNTER — Other Ambulatory Visit: Payer: Self-pay

## 2019-11-26 ENCOUNTER — Ambulatory Visit (HOSPITAL_COMMUNITY)
Admission: RE | Admit: 2019-11-26 | Discharge: 2019-11-26 | Disposition: A | Discharge status: Home / Self Care | Payer: BC Managed Care – PPO | Source: Ambulatory Visit | Attending: Cardiovascular Disease | Admitting: Cardiovascular Disease

## 2019-11-26 DIAGNOSIS — R0602 Shortness of breath: Secondary | ICD-10-CM | POA: Diagnosis not present

## 2019-11-26 DIAGNOSIS — I712 Thoracic aortic aneurysm, without rupture: Secondary | ICD-10-CM | POA: Diagnosis not present

## 2019-11-26 MED ORDER — NITROGLYCERIN 0.4 MG SL SUBL
SUBLINGUAL_TABLET | SUBLINGUAL | Status: AC
  Filled 2019-11-26: qty 2

## 2019-11-26 MED ORDER — NITROGLYCERIN 0.4 MG SL SUBL
0.8000 mg | SUBLINGUAL_TABLET | Freq: Once | SUBLINGUAL | Status: AC
  Administered 2019-11-26: 0.8 mg via SUBLINGUAL

## 2019-11-26 MED ORDER — IOHEXOL 350 MG/ML SOLN
80.0000 mL | Freq: Once | INTRAVENOUS | Status: AC | PRN
  Administered 2019-11-26: 80 mL via INTRAVENOUS

## 2019-12-02 ENCOUNTER — Other Ambulatory Visit: Payer: Self-pay | Admitting: Cardiovascular Disease

## 2019-12-09 NOTE — Telephone Encounter (Signed)
Created in error

## 2019-12-12 ENCOUNTER — Other Ambulatory Visit: Payer: Self-pay | Admitting: Family Medicine

## 2019-12-12 DIAGNOSIS — E559 Vitamin D deficiency, unspecified: Secondary | ICD-10-CM

## 2019-12-12 DIAGNOSIS — I712 Thoracic aortic aneurysm, without rupture, unspecified: Secondary | ICD-10-CM

## 2019-12-12 DIAGNOSIS — I10 Essential (primary) hypertension: Secondary | ICD-10-CM

## 2019-12-12 NOTE — Telephone Encounter (Signed)
Pt called has 2 issues  :  1) --- He needs refills on several diff meds & OV is required to get refills but no available appts till May 12th Pt did schedule appt ask for :   carvedilol (COREG) 12.5 MG tablet [[id number]]   Order Details Dose: 12.5 mg Route: Oral Frequency: 2 times daily  Dispense Quantity: 180 tablet Refills: 1   Indications of Use: Hypertension, TAA- 4.4 cm diam      Sig: Take 1 tablet (12.5 mg total) by mouth 2 (two) times daily.       &  Vitamin D, Ergocalciferol, (DRISDOL) 1.25 MG (50000 UT) CAPS capsule [[id number]]   Order Details Dose, Route, Frequency: As Directed  Dispense Quantity: 12 capsule Refills: 3   Indications of Use: Vitamin D Deficiency    losartan-hydrochlorothiazide (HYZAAR) 100-12.5 MG tablet [[id number]]   Order Details Dose, Route, Frequency: As Directed  Dispense Quantity: 15 tablet Refills: 0   Note to Pharmacy: **PATIENT NEEDS APT FOR FURTHER REFILLS**      Sig: TAKE 1 TABLET BY MOUTH DAILY   ---Forwarding refill request to med asst that if approved send to :   [id number] [name], [place] - [address] AT [facility] OF [facility] & [address] [phone] (Phone) [phone] (Fax)    2nd Issue----  :  Pt states Cardiologist wishes PCP to draw labs on pt to compare them w/ what lab his office has on pt---Do NOT see any Lab Orders in pt's chart--Plse review & advise--- Pt has appt 5/7 @ 8am.  --glh

## 2019-12-15 MED ORDER — CARVEDILOL 12.5 MG PO TABS: 12.5000 mg | ORAL_TABLET | Freq: Two times a day (BID) | ORAL | 0 refills | Status: DC

## 2019-12-15 MED ORDER — VITAMIN D (ERGOCALCIFEROL) 1.25 MG (50000 UNIT) PO CAPS: ORAL_CAPSULE | ORAL | 0 refills | Status: DC

## 2019-12-15 NOTE — Telephone Encounter (Signed)
Patient was last seen 02/24/19 and advised to follow up in 4 months.   Patient has already been given 15 day supply of Losartan-hctz (unable to send any more per refill protocol) Sending in Vit D -30 day supply and Carvedilol 30 day supply. Left message for patient with this information.   AS, CMA

## 2020-01-02 ENCOUNTER — Encounter: Payer: Self-pay | Admitting: Physician Assistant

## 2020-01-02 ENCOUNTER — Other Ambulatory Visit: Payer: Self-pay

## 2020-01-02 ENCOUNTER — Ambulatory Visit: Payer: BC Managed Care – PPO | Admitting: Physician Assistant

## 2020-01-02 VITALS — BP 129/80 | HR 57 | Temp 97.9°F | Ht 70.5 in | Wt 301.2 lb

## 2020-01-02 DIAGNOSIS — I10 Essential (primary) hypertension: Secondary | ICD-10-CM

## 2020-01-02 DIAGNOSIS — E782 Mixed hyperlipidemia: Secondary | ICD-10-CM

## 2020-01-02 DIAGNOSIS — E785 Hyperlipidemia, unspecified: Secondary | ICD-10-CM | POA: Diagnosis not present

## 2020-01-02 DIAGNOSIS — I712 Thoracic aortic aneurysm, without rupture, unspecified: Secondary | ICD-10-CM

## 2020-01-02 DIAGNOSIS — E559 Vitamin D deficiency, unspecified: Secondary | ICD-10-CM

## 2020-01-02 DIAGNOSIS — R7303 Prediabetes: Secondary | ICD-10-CM | POA: Diagnosis not present

## 2020-01-02 MED ORDER — VITAMIN D (ERGOCALCIFEROL) 1.25 MG (50000 UNIT) PO CAPS: ORAL_CAPSULE | ORAL | 1 refills | Status: DC

## 2020-01-02 MED ORDER — LOSARTAN POTASSIUM-HCTZ 100-12.5 MG PO TABS: 1.0000 | ORAL_TABLET | Freq: Every day | ORAL | 0 refills | Status: DC

## 2020-01-02 NOTE — Patient Instructions (Signed)

## 2020-01-02 NOTE — Progress Notes (Signed)
Established Patient Office Visit  Subjective:  Patient ID: Keith Vance, male    DOB: 1961-08-17  Age: 59 y.o. MRN: [id number]  CC:  Chief Complaint  Patient presents with  . Hyperlipidemia  . Hypertension    HPI [name] presents for 4 month chronic follow-up on hypertension, dyslipidemia, and pre-diabetes.   HTN: Pt denies chest pain, palpitations, or dizziness. He does have intermittent lower extremity swelling but hasn't noticed increased swelling from his baseline. Taking medication as directed without side effects. Doesn't have a BP machine to check BP at home. Pt follows a low salt diet.  Dyslipidemia: Pt is followed by cardiology for thoracic aortic aneurysm and states had CT done earlier this year. He saw his cardiologist 09/2019. Pt reports medication compliance. Denies side effects.   Pre-diabetes: Denies polyuria/polydipsia. Last A1c 5.6  Past Medical History:  Diagnosis Date  . Arthritis   . Ascending aortic aneurysm ([place])    a. 10/2015 CTA chest: 4.4cm aneurysmal dil of Asc Ao-->rec f/u in 1 yr.  . Chest pain 10/11/2018  . GERD (gastroesophageal reflux disease)    hx  . History of kidney stones   . Hyperlipidemia   . Hypertension   . Pneumonia    hx  . SBO (small bowel obstruction) (HCC)    a. 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  [name] Sleep apnea    cpap    Past Surgical History:  Procedure Laterality Date  . HERNIA REPAIR Left    inguinal, and umbilical  . JOINT REPLACEMENT    . REVISION TOTAL KNEE ARTHROPLASTY Left 07/30/2017   LEFT TOTAL KNEE PARTIAL REVISION AND SYNOVECTOMY/notes 07/30/2017  . TOTAL KNEE ARTHROPLASTY Left 04/24/2016   Procedure: LEFT TOTAL KNEE ARTHROPLASTY;  Surgeon: Paralee Cancel, MD;  Location: [facility];  Service: Orthopedics;  Laterality: Left;  . TOTAL KNEE REVISION Left 07/30/2017   Procedure: LEFT TOTAL KNEE PARTIAL REVISION AND SYNOVECTOMY;  Surgeon: Frederik Pear, MD;  Location: [facility];  Service: Orthopedics;  Laterality: Left;    Family  History  Problem Relation Age of Onset  . Stroke Mother   . Heart disease Mother   . Heart attack Maternal Grandmother        >21s of age  . Hypertension Father   . Diabetes Father   . Cancer Father        bone  . Hyperlipidemia Father     Social History   Socioeconomic History  . Marital status: Married    Spouse name: Not on file  . Number of children: Not on file  . Years of education: Not on file  . Highest education level: Not on file  Occupational History  . Not on file  Tobacco Use  . Smoking status: Former Research scientist (life sciences)  . Smokeless tobacco: Never Used  . Tobacco comment: off and on as a teenager - very little per patient   Substance and Sexual Activity  . Alcohol use: No  . Drug use: No  . Sexual activity: Yes  Other Topics Concern  . Not on file  Social History Narrative  . Not on file   Social Determinants of Health   Financial Resource Strain:   . Difficulty of Paying Living Expenses:   Food Insecurity:   . Worried About Charity fundraiser in the Last Year:   . Arboriculturist in the Last Year:   Transportation Needs:   . Film/video editor (Medical):   [name] Lack of Transportation (Non-Medical):  Physical Activity:   . Days of Exercise per Week:   . Minutes of Exercise per Session:   Stress:   . Feeling of Stress :   Social Connections:   . Frequency of Communication with Friends and Family:   . Frequency of Social Gatherings with Friends and Family:   . Attends Religious Services:   . Active Member of Clubs or Organizations:   . Attends Archivist Meetings:   [name] Marital Status:   Intimate Partner Violence:   . Fear of Current or Ex-Partner:   . Emotionally Abused:   [name] Physically Abused:   . Sexually Abused:     Outpatient Medications Prior to Visit  Medication Sig Dispense Refill  . amLODipine (NORVASC) 10 MG tablet TAKE 1 TABLET BY MOUTH DAILY 30 tablet 9  . aspirin EC 81 MG EC tablet Take 1 tablet (81 mg total) by mouth daily. 30  tablet 0  . atorvastatin (LIPITOR) 40 MG tablet Take 0.5 tablets (20 mg total) by mouth at bedtime.    . carvedilol (COREG) 12.5 MG tablet Take 1 tablet (12.5 mg total) by mouth 2 (two) times daily. **PATIENT NEEDS APT FOR FURTHER REFILLS** 60 tablet 0  . docusate sodium (COLACE) 100 MG capsule Take 200-300 mg by mouth See admin instructions. 353m in the morning and 2026mat night    . Multiple Vitamins-Minerals (MULTIVITAMIN WITH MINERALS) tablet Take 1 tablet by mouth daily.    . Omega-3 Fatty Acids (FISH OIL) 1200 MG CAPS Take 1,200 mg by mouth daily.    . Marland Kitchenosartan-hydrochlorothiazide (HYZAAR) 100-12.5 MG tablet TAKE 1 TABLET BY MOUTH DAILY 15 tablet 0  . Vitamin D, Ergocalciferol, (DRISDOL) 1.25 MG (50000 UNIT) CAPS capsule Take one tablet wkly **PATIENT NEEDS APT FOR FURTHER REFILLS** 4 capsule 0  . metoprolol tartrate (LOPRESSOR) 100 MG tablet Take 2 hours prior to CTA (Patient not taking: Reported on 01/02/2020) 1 tablet 0   No facility-administered medications prior to visit.    No Known Allergies  ROS Review of Systems:   A fourteen system review of systems was performed and found to be positive as per HPI.    Objective:    Physical Exam  General: Well nourished, in no apparent distress. Eyes: PERRLA, EOMs, conjunctiva clr Resp: Respiratory effort- normal, ECTA B/L w/o W/R/R  Cardio: RRR w/o MRGs. Abdomen: no gross distention. Lymphatics:  less 2 sec cap RF M-sk: Full ROM, 5/5 strength, normal gait.  Skin: Warm, dry  Neuro: Alert, Oriented Psych: Normal affect, Insight and Judgment appropriate.   BP (!) 148/99   Pulse (!) 57   Temp 97.9 F (36.6 C) (Oral)   Ht 5' 10.5" (1.791 m)   Wt (!) 301 lb 3.2 oz (136.6 kg)   SpO2 96%   BMI 42.61 kg/m  Wt Readings from Last 3 Encounters:  01/02/20 (!) 301 lb 3.2 oz (136.6 kg)  10/22/19 295 lb 9.6 oz (134.1 kg)  02/24/19 298 lb (135.2 kg)     Health Maintenance Due  Topic Date Due  . TETANUS/TDAP  Never done  .  COLONOSCOPY  Never done    There are no preventive care reminders to display for this patient.  Lab Results  Component Value Date   TSH 3.196 10/12/2018   Lab Results  Component Value Date   WBC 7.0 10/16/2018   HGB 14.9 10/16/2018   HCT 41.4 10/16/2018   MCV 92 10/16/2018   PLT 156 10/16/2018   Lab Results  Component Value  Date   NA 143 02/24/2019   K 3.8 02/24/2019   CO2 23 02/24/2019   GLUCOSE 91 02/24/2019   BUN 26 (H) 02/24/2019   CREATININE 1.28 (H) 02/24/2019   BILITOT 0.7 02/24/2019   ALKPHOS 80 02/24/2019   AST 56 (H) 02/24/2019   ALT 92 (H) 02/24/2019   PROT 7.7 02/24/2019   ALBUMIN 4.7 02/24/2019   CALCIUM 9.4 02/24/2019   ANIONGAP 10 10/12/2018   Lab Results  Component Value Date   CHOL 187 10/12/2018   Lab Results  Component Value Date   HDL 46 10/12/2018   Lab Results  Component Value Date   LDLCALC 82 10/12/2018   Lab Results  Component Value Date   TRIG 293 (H) 10/12/2018   Lab Results  Component Value Date   CHOLHDL 4.1 10/12/2018   Lab Results  Component Value Date   HGBA1C 5.6 02/24/2019      Assessment & Plan:   Problem List Items Addressed This Visit      Cardiovascular and Mediastinum   Essential hypertension - Primary (Chronic)   Relevant Medications   losartan-hydrochlorothiazide (HYZAAR) 100-12.5 MG tablet   Other Relevant Orders   Comp Met (CMET)     Other   Dyslipidemia, goal LDL below 100 (Chronic)   Relevant Medications   losartan-hydrochlorothiazide (HYZAAR) 100-12.5 MG tablet   Other Relevant Orders   Lipid Profile   Elevated triglycerides with high cholesterol   Relevant Medications   losartan-hydrochlorothiazide (HYZAAR) 100-12.5 MG tablet   Pre-diabetes   Relevant Orders   HgB A1c   Vitamin D deficiency   Relevant Medications   Vitamin D, Ergocalciferol, (DRISDOL) 1.25 MG (50000 UNIT) CAPS capsule   Other Relevant Orders   Vitamin D (25 hydroxy)     HTN: - BP today is 148/99 and recheck was  129/80. - Encourage to start BP and HR monitoring at home. - Continue Amlodipine, Carvedilol, Losartan-HCTZ - Continue DASH diet and stay as active as possible. - Will check CMP today to monitor medication therapy.  Dyslipidemia, Elevated Triglycerides: - Last lipid panel: Triglycerides 293, LDL 82, HLD 46  - Continue Atorvastatin 40 mg 0.5 tablet - Encourage heart healthy diet. - Will recheck lipid panel and hepatic function today.  Pre-diabetes:  - Last A1c 5.6, wnl's - Encourage to follow low carbohydrate/glucose diet - Will recheck A1c today.  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 - Follow-up as directed.  Vitamin D deficiency: - Last Vitamin D low, 24.8 - Refill provided. - Will recheck Vitamin D today.   Meds ordered this encounter  Medications  . losartan-hydrochlorothiazide (HYZAAR) 100-12.5 MG tablet    Sig: Take 1 tablet by mouth daily.    Dispense:  90 tablet    Refill:  0  . Vitamin D, Ergocalciferol, (DRISDOL) 1.25 MG (50000 UNIT) CAPS capsule    Sig: Take one tablet wkly    Dispense:  12 capsule    Refill:  1    Follow-up: Return for HTN, HLD, Pre-diabetes in 4 months.    Lorrene Reid, PA-C

## 2020-01-03 LAB — COMPREHENSIVE METABOLIC PANEL
ALT: 115 IU/L — ABNORMAL HIGH (ref 0–44)
AST: 93 IU/L — ABNORMAL HIGH (ref 0–40)
Albumin/Globulin Ratio: 1.6 (ref 1.2–2.2)
Albumin: 4.4 g/dL (ref 3.8–4.9)
Alkaline Phosphatase: 89 IU/L (ref 39–117)
BUN/Creatinine Ratio: 17 (ref 9–20)
BUN: 17 mg/dL (ref 6–24)
Bilirubin Total: 0.7 mg/dL (ref 0.0–1.2)
CO2: 22 mmol/L (ref 20–29)
Calcium: 9.1 mg/dL (ref 8.7–10.2)
Chloride: 101 mmol/L (ref 96–106)
Creatinine, Ser: 1.03 mg/dL (ref 0.76–1.27)
GFR calc Af Amer: 92 mL/min/{1.73_m2} (ref >59)
GFR calc non Af Amer: 80 mL/min/{1.73_m2} (ref >59)
Globulin, Total: 2.8 g/dL (ref 1.5–4.5)
Glucose: 101 mg/dL — ABNORMAL HIGH (ref 65–99)
Potassium: 4 mmol/L (ref 3.5–5.2)
Sodium: 139 mmol/L (ref 134–144)
Total Protein: 7.2 g/dL (ref 6.0–8.5)

## 2020-01-03 LAB — LIPID PANEL
Chol/HDL Ratio: 3.8 ratio (ref 0.0–5.0)
Cholesterol, Total: 196 mg/dL (ref 100–199)
HDL: 51 mg/dL (ref >39)
LDL Chol Calc (NIH): 125 mg/dL — ABNORMAL HIGH (ref 0–99)
Triglycerides: 109 mg/dL (ref 0–149)
VLDL Cholesterol Cal: 20 mg/dL (ref 5–40)

## 2020-01-03 LAB — HEMOGLOBIN A1C
Est. average glucose Bld gHb Est-mCnc: 117 mg/dL
Hgb A1c MFr Bld: 5.7 % — ABNORMAL HIGH (ref 4.8–5.6)

## 2020-01-03 LAB — VITAMIN D 25 HYDROXY (VIT D DEFICIENCY, FRACTURES): Vit D, 25-Hydroxy: 29.5 ng/mL — ABNORMAL LOW (ref 30.0–100.0)

## 2020-01-11 ENCOUNTER — Other Ambulatory Visit: Payer: Self-pay | Admitting: Family Medicine

## 2020-01-11 DIAGNOSIS — I712 Thoracic aortic aneurysm, without rupture, unspecified: Secondary | ICD-10-CM

## 2020-01-11 DIAGNOSIS — I10 Essential (primary) hypertension: Secondary | ICD-10-CM

## 2020-01-16 ENCOUNTER — Other Ambulatory Visit: Payer: Self-pay

## 2020-01-16 DIAGNOSIS — I712 Thoracic aortic aneurysm, without rupture, unspecified: Secondary | ICD-10-CM

## 2020-01-16 DIAGNOSIS — I10 Essential (primary) hypertension: Secondary | ICD-10-CM

## 2020-01-16 MED ORDER — CARVEDILOL 12.5 MG PO TABS: 12.5000 mg | ORAL_TABLET | Freq: Two times a day (BID) | ORAL | 0 refills | Status: DC

## 2020-01-31 ENCOUNTER — Emergency Department (HOSPITAL_COMMUNITY): Payer: BC Managed Care – PPO

## 2020-01-31 ENCOUNTER — Encounter (HOSPITAL_COMMUNITY): Payer: Self-pay | Admitting: Emergency Medicine

## 2020-01-31 ENCOUNTER — Other Ambulatory Visit: Payer: Self-pay

## 2020-01-31 ENCOUNTER — Emergency Department (HOSPITAL_COMMUNITY)
Admission: EM | Admit: 2020-01-31 | Discharge: 2020-01-31 | Disposition: A | Discharge status: Home / Self Care | Payer: BC Managed Care – PPO | Attending: Emergency Medicine | Admitting: Emergency Medicine

## 2020-01-31 DIAGNOSIS — Z7982 Long term (current) use of aspirin: Secondary | ICD-10-CM | POA: Diagnosis not present

## 2020-01-31 DIAGNOSIS — M25422 Effusion, left elbow: Secondary | ICD-10-CM | POA: Diagnosis not present

## 2020-01-31 DIAGNOSIS — R2232 Localized swelling, mass and lump, left upper limb: Secondary | ICD-10-CM | POA: Diagnosis not present

## 2020-01-31 DIAGNOSIS — M25522 Pain in left elbow: Secondary | ICD-10-CM | POA: Insufficient documentation

## 2020-01-31 DIAGNOSIS — I10 Essential (primary) hypertension: Secondary | ICD-10-CM | POA: Diagnosis not present

## 2020-01-31 DIAGNOSIS — Z96652 Presence of left artificial knee joint: Secondary | ICD-10-CM | POA: Insufficient documentation

## 2020-01-31 DIAGNOSIS — Z87891 Personal history of nicotine dependence: Secondary | ICD-10-CM | POA: Diagnosis not present

## 2020-01-31 DIAGNOSIS — Z79899 Other long term (current) drug therapy: Secondary | ICD-10-CM | POA: Insufficient documentation

## 2020-01-31 MED ORDER — MELOXICAM 7.5 MG PO TABS: 7.5000 mg | ORAL_TABLET | Freq: Every day | ORAL | 0 refills | Status: DC

## 2020-01-31 NOTE — Progress Notes (Signed)
Orthopedic Tech Progress Note Patient Details:  Keith Vance 01-13-1961 [id number]  Ortho Devices Type of Ortho Device: Sling immobilizer Ortho Device/Splint Location: Left upper extremity Ortho Device/Splint Interventions: Ordered, Application, Adjustment   Post Interventions Patient Tolerated: Well Instructions Provided: Adjustment of device, Care of device, Poper ambulation with device   [name] 01/31/2020, 8:52 AM

## 2020-01-31 NOTE — Discharge Instructions (Signed)
Please read and follow all provided instructions.  Your diagnoses today include:  1. Left elbow pain     Tests performed today include:  An x-ray of the affected area - does NOT show any broken bones  Vital signs. See below for your results today.   Medications prescribed:   Meloxicam - anti-inflammatory pain medication  You have been prescribed an anti-inflammatory medication or NSAID. Take with food. Do not take aspirin, ibuprofen, or naproxen if taking this medication. Take smallest effective dose for the shortest duration needed for your pain. Stop taking if you experience stomach pain or vomiting.   Take any prescribed medications only as directed.  Home care instructions:   Follow any educational materials contained in this packet  Follow R.I.C.E. Protocol:  R - rest your injury   I  - use ice on injury without applying directly to skin  C - compress injury with bandage or splint  E - elevate the injury as much as possible  Follow-up instructions: Please follow-up with your orthopedic physician in 1 week.   Return instructions:   Please return if your fingers are numb or tingling, appear gray or blue, or you have severe pain (also elevate the arm and loosen splint or wrap if you were given one)  Please return to the Emergency Department if you experience worsening symptoms.   Please return if you have any other emergent concerns.  Additional Information:  Your vital signs today were: BP 138/84 (BP Location: Right Arm)   Pulse 85   Temp 98.2 F (36.8 C) (Oral)   Resp 16   Ht 5\' 11"  (1.803 m)   Wt (!) 136.6 kg   SpO2 95%   BMI 42.00 kg/m  If your blood pressure (BP) was elevated above 135/85 this visit, please have this repeated by your doctor within one month. --------------

## 2020-01-31 NOTE — ED Triage Notes (Signed)
Pt c/o left elbow pain that started today. Denies any injury.

## 2020-01-31 NOTE — ED Provider Notes (Signed)
[facility] EMERGENCY DEPARTMENT Provider Note   CSN: [id number] Arrival date & time: 01/31/20  6213     History Chief Complaint  Patient presents with  . Elbow Pain    Keith Vance is a 59 y.o. male.  Patient with history of ascending aortic aneurysm which is being monitored yearly, no history of MI, history of knee replacements --presents the emergency department with acute onset of left elbow pain starting around 5:30 PM yesterday.  Patient reports pain over the elbow area.  No injury at time of onset, he was just sitting in his truck.  Pain radiates into the forearm.  No numbness or tingling distally.  No redness.  Patient has had some swelling of the elbow.  No fevers.  Pain is worse with movement.  No treatments prior to arrival.        Past Medical History:  Diagnosis Date  . Arthritis   . Ascending aortic aneurysm ([place])    a. 10/2015 CTA chest: 4.4cm aneurysmal dil of Asc Ao-->rec f/u in 1 yr.  . Chest pain 10/11/2018  . GERD (gastroesophageal reflux disease)    hx  . History of kidney stones   . Hyperlipidemia   . Hypertension   . Pneumonia    hx  . SBO (small bowel obstruction) (HCC)    a. 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  [name] Sleep apnea    cpap    Patient Active Problem List   Diagnosis Date Noted  . Elevated serum creatinine 02/24/2019  . Pre-diabetes 02/24/2019  . Vitamin D deficiency 02/24/2019  . Normal coronary arteries 01/02/2019  . NAFLD (nonalcoholic fatty liver disease) 05/09/2018  . Elevated triglycerides with high cholesterol 03/18/2018  . Morbid obesity ([place]) 02/27/2018  . h/o elevated LFT's- dx of Fatty liver disease, nonalcoholic 08/65/7846  . Primary osteoarthritis of left knee 07/30/2017  . Failed total knee arthroplasty, initial encounter ([place]) 07/27/2017  . S/P left TKA 04/24/2016  . S/P knee replacement 04/24/2016  . Thoracic aortic aneurysm ([place]) 01/07/2016  . Midsternal chest pain 11/14/2015  . Essential hypertension  11/14/2015  . Chest pain 11/11/2015  . History of small bowel obstruction 02/27/2015  . Dyslipidemia, goal LDL below 100 02/27/2015  . Leukocytosis 02/27/2015  . Elevated LFTs 03/07/2012  . GI problem 03/07/2012  . Sleep apnea 03/07/2012    Past Surgical History:  Procedure Laterality Date  . HERNIA REPAIR Left    inguinal, and umbilical  . JOINT REPLACEMENT    . REVISION TOTAL KNEE ARTHROPLASTY Left 07/30/2017   LEFT TOTAL KNEE PARTIAL REVISION AND SYNOVECTOMY/notes 07/30/2017  . TOTAL KNEE ARTHROPLASTY Left 04/24/2016   Procedure: LEFT TOTAL KNEE ARTHROPLASTY;  Surgeon: Paralee Cancel, MD;  Location: [facility];  Service: Orthopedics;  Laterality: Left;  . TOTAL KNEE REVISION Left 07/30/2017   Procedure: LEFT TOTAL KNEE PARTIAL REVISION AND SYNOVECTOMY;  Surgeon: Frederik Pear, MD;  Location: [facility];  Service: Orthopedics;  Laterality: Left;       Family History  Problem Relation Age of Onset  . Stroke Mother   . Heart disease Mother   . Heart attack Maternal Grandmother        >67s of age  . Hypertension Father   . Diabetes Father   . Cancer Father        bone  . Hyperlipidemia Father     Social History   Tobacco Use  . Smoking status: Former Research scientist (life sciences)  . Smokeless tobacco: Never Used  . Tobacco comment: off and  on as a teenager - very little per patient   Substance Use Topics  . Alcohol use: No  . Drug use: No    Home Medications Prior to Admission medications   Medication Sig Start Date End Date Taking? Authorizing Provider  amLODipine (NORVASC) 10 MG tablet TAKE 1 TABLET BY MOUTH DAILY 12/04/19   Runell Gess, MD  aspirin EC 81 MG EC tablet Take 1 tablet (81 mg total) by mouth daily. 10/13/18   Barnetta Chapel, MD  atorvastatin (LIPITOR) 40 MG tablet Take 0.5 tablets (20 mg total) by mouth at bedtime. 05/09/18   Opalski, Gavin Pound, DO  carvedilol (COREG) 12.5 MG tablet Take 1 tablet (12.5 mg total) by mouth 2 (two) times daily. 01/16/20   Mayer Masker, PA-C    docusate sodium (COLACE) 100 MG capsule Take 200-300 mg by mouth See admin instructions. 300mg  in the morning and 200mg  at night    [provider]  losartan-hydrochlorothiazide (HYZAAR) 100-12.5 MG tablet Take 1 tablet by mouth daily. 01/02/20   , PA-C  meloxicam (MOBIC) 7.5 MG tablet Take 1 tablet (7.5 mg total) by mouth daily. 01/31/20   Mayer Masker, PA-C  Multiple Vitamins-Minerals (MULTIVITAMIN WITH MINERALS) tablet Take 1 tablet by mouth daily.    [provider]  Omega-3 Fatty Acids (FISH OIL) 1200 MG CAPS Take 1,200 mg by mouth daily.    [provider]  Vitamin D, Ergocalciferol, (DRISDOL) 1.25 MG (50000 UNIT) CAPS capsule Take one tablet wkly 01/02/20   Renne Crigler, PA-C    Allergies    Patient has no known allergies.  Review of Systems   Review of Systems  Constitutional: Negative for activity change.  Musculoskeletal: Positive for arthralgias, joint swelling and myalgias.  Skin: Negative for wound.  Neurological: Negative for weakness and numbness.    Physical Exam Updated Vital Signs BP 115/80   Pulse 60   Temp 98.6 F (37 C) (Oral)   Resp 16   Ht 5\' 11"  (1.803 m)   Wt (!) 136.6 kg   SpO2 96%   BMI 42.00 kg/m   Physical Exam Vitals and nursing note reviewed.  Constitutional:      Appearance: He is well-developed.  HENT:     Head: Normocephalic and atraumatic.  Eyes:     Conjunctiva/sclera: Conjunctivae normal.  Cardiovascular:     Pulses: Normal pulses. No decreased pulses.  Musculoskeletal:        General: Tenderness present.     Left shoulder: No tenderness. Normal range of motion.     Left upper arm: No swelling or edema.     Left elbow: Swelling and effusion present. Decreased range of motion. Tenderness present in olecranon process.     Left forearm: No swelling, edema or tenderness.     Left wrist: No tenderness. Normal range of motion.     Cervical back: Normal range of motion and neck supple.  Skin:     General: Skin is warm and dry.  Neurological:     Mental Status: He is alert.     Sensory: No sensory deficit.     Comments: Motor, sensation, and vascular distal to the injury is fully intact.      ED Results / Procedures / Treatments   Labs (all labs ordered are listed, but only abnormal results are displayed) Labs Reviewed - No data to display  EKG None  Radiology DG Elbow Complete Left  Result Date: 01/31/2020 CLINICAL DATA:  Atraumatic left elbow pain starting  today EXAM: LEFT ELBOW - COMPLETE 3+ VIEW COMPARISON:  None. FINDINGS: Joint effusion without fracture or erosion. Mild spurring about the elbow and clustered ossified densities about the olecranon. There is a more hazy calcification lateral to the distal humerus. IMPRESSION: 1. Joint effusion without fracture or erosion. 2. Mild degenerative spurring about the elbow. 3. Amorphous calcification lateral to the elbow which could be developing enthesophyte or crystalline. Electronically Signed   By: Marnee Spring M.D.   On: 01/31/2020 06:54    Procedures Procedures (including critical care time)  Medications Ordered in ED Medications - No data to display  ED Course  I have reviewed the triage vital signs and the nursing notes.  Pertinent labs & imaging results that were available during my care of the patient were reviewed by me and considered in my medical decision making (see chart for details).  Patient seen and examined.  Discussed x-ray results with patient at bedside.  Will start on 7.5 mg meloxicam x10 days.  Will give sling and have patient follow-up with his orthopedic doctor.  He may need an aspiration if effusion persists.  Vital signs reviewed and are as follows: BP 115/80   Pulse 60   Temp 98.6 F (37 C) (Oral)   Resp 16   Ht 5\' 11"  (1.803 m)   Wt (!) 136.6 kg   SpO2 96%   BMI 42.00 kg/m   Patient urged to return with worsening symptoms, fever, worsening redness or pain, or other concerns. Patient  verbalized understanding and agrees with plan.      MDM Rules/Calculators/A&P                      Patient with acute onset of left elbow pain with swelling.  He has reasonable range of motion but decreased due to pain.  I have low concern for septic arthritis at this point.  Symptoms potentially related to osteo or inflammatory arthritis.  Patient looks well, nontoxic.  Vital signs are reasonable.  Patient does have some cardiac risk factors, however will give low-dose meloxicam for 10 days given that his blood pressure is well controlled here.  No history of renal dysfunction (crt 1.03 on 01/02/20)  Final Clinical Impression(s) / ED Diagnoses Final diagnoses:  Left elbow pain    Rx / DC Orders ED Discharge Orders         Ordered    meloxicam (MOBIC) 7.5 MG tablet  Daily     01/31/20 0831           04/01/20, PA-C 01/31/20 0911    04/01/20, MD 01/31/20 [phone]

## 2020-03-25 ENCOUNTER — Ambulatory Visit
Admission: EM | Admit: 2020-03-25 | Discharge: 2020-03-25 | Disposition: A | Discharge status: Home / Self Care | Payer: BC Managed Care – PPO | Attending: Physician Assistant | Admitting: Physician Assistant

## 2020-03-25 ENCOUNTER — Encounter: Payer: Self-pay | Admitting: Emergency Medicine

## 2020-03-25 ENCOUNTER — Other Ambulatory Visit: Payer: Self-pay

## 2020-03-25 DIAGNOSIS — R0981 Nasal congestion: Secondary | ICD-10-CM

## 2020-03-25 DIAGNOSIS — R05 Cough: Secondary | ICD-10-CM | POA: Diagnosis not present

## 2020-03-25 DIAGNOSIS — R52 Pain, unspecified: Secondary | ICD-10-CM

## 2020-03-25 DIAGNOSIS — R509 Fever, unspecified: Secondary | ICD-10-CM

## 2020-03-25 DIAGNOSIS — R059 Cough, unspecified: Secondary | ICD-10-CM

## 2020-03-25 MED ORDER — ONDANSETRON 4 MG PO TBDP: 4.0000 mg | ORAL_TABLET | Freq: Three times a day (TID) | ORAL | 0 refills | Status: DC | PRN

## 2020-03-25 MED ORDER — FLUTICASONE PROPIONATE 50 MCG/ACT NA SUSP: 2.0000 | Freq: Every day | NASAL | 0 refills | Status: DC

## 2020-03-25 MED ORDER — BENZONATATE 200 MG PO CAPS: 200.0000 mg | ORAL_CAPSULE | Freq: Three times a day (TID) | ORAL | 0 refills | Status: DC

## 2020-03-25 NOTE — Discharge Instructions (Signed)
COVID PCR testing ordered. I would like you to quarantine until testing results. Tessalon for cough. Zofran for nausea/vomiting. You can take over the counter flonase/nasacort to help with nasal congestion/drainage. Tylenol/motrin for pain and fever. Keep hydrated, urine should be clear to pale yellow in color. If experiencing shortness of breath, trouble breathing, go to the emergency department for further evaluation needed.

## 2020-03-25 NOTE — ED Provider Notes (Signed)
[facility]    CSN: [id number] Arrival date & time: 03/25/20  0820      History   Chief Complaint Chief Complaint  Patient presents with  . URI    HPI FIELDS OROS is a 59 y.o. male.   59 year old male comes in for 2 day of URI symptoms. Dry cough, changes in taste, fever, nasal congestion, headache, body aches, clamminess. States fever 105, responsive to antipyretic. Nausea without vomiting. Denies abdominal pain, diarrhea. Denies shortness of breath. Fully vaccinate Fransisca Kaufmann, 09/2019). Former smoker.       Past Medical History:  Diagnosis Date  . Arthritis   . Ascending aortic aneurysm (HCC)    a. 10/2015 CTA chest: 4.4cm aneurysmal dil of Asc Ao-->rec f/u in 1 yr.  . Chest pain 10/11/2018  . GERD (gastroesophageal reflux disease)    hx  . History of kidney stones   . Hyperlipidemia   . Hypertension   . Pneumonia    hx  . SBO (small bowel obstruction) (HCC)    a. 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  [name] Sleep apnea    cpap    Patient Active Problem List   Diagnosis Date Noted  . Elevated serum creatinine 02/24/2019  . Pre-diabetes 02/24/2019  . Vitamin D deficiency 02/24/2019  . Normal coronary arteries 01/02/2019  . NAFLD (nonalcoholic fatty liver disease) 67/59/1638  . Elevated triglycerides with high cholesterol 03/18/2018  . Morbid obesity (HCC) 02/27/2018  . h/o elevated LFT's- dx of Fatty liver disease, nonalcoholic 02/27/2018  . Primary osteoarthritis of left knee 07/30/2017  . 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 S/P left TKA 04/24/2016  . S/P knee replacement 04/24/2016  . Thoracic aortic aneurysm (HCC) 01/07/2016  . Midsternal chest pain 11/14/2015  . Essential hypertension 11/14/2015  . Chest pain 11/11/2015  . History of small bowel obstruction 02/27/2015  . Dyslipidemia, goal LDL below 100 02/27/2015  . Leukocytosis 02/27/2015  . Elevated LFTs 03/07/2012  . GI problem 03/07/2012  . Sleep apnea 03/07/2012     Past Surgical History:  Procedure Laterality Date  . HERNIA REPAIR Left    inguinal, and umbilical  . JOINT REPLACEMENT    . REVISION TOTAL KNEE ARTHROPLASTY Left 07/30/2017   LEFT TOTAL KNEE PARTIAL REVISION AND SYNOVECTOMY/notes 07/30/2017  . TOTAL KNEE ARTHROPLASTY Left 04/24/2016   Procedure: LEFT TOTAL KNEE ARTHROPLASTY;  Surgeon: Durene Romans, MD;  Location: [facility];  Service: Orthopedics;  Laterality: Left;  . TOTAL KNEE REVISION Left 07/30/2017   Procedure: LEFT TOTAL KNEE PARTIAL REVISION AND SYNOVECTOMY;  Surgeon: Gean Birchwood, MD;  Location: [facility];  Service: Orthopedics;  Laterality: Left;       Home Medications    Prior to Admission medications   Medication Sig Start Date End Date Taking? Authorizing Provider  amLODipine (NORVASC) 10 MG tablet TAKE 1 TABLET BY MOUTH DAILY 12/04/19   Runell Gess, MD  aspirin EC 81 MG EC tablet Take 1 tablet (81 mg total) by mouth daily. 10/13/18   Barnetta Chapel, MD  atorvastatin (LIPITOR) 40 MG tablet Take 0.5 tablets (20 mg total) by mouth at bedtime. 05/09/18   Opalski, Gavin Pound, DO  benzonatate (TESSALON) 200 MG capsule Take 1 capsule (200 mg total) by mouth every 8 (eight) hours. 03/25/20   Cathie Hoops, Amy V, PA-C  carvedilol (COREG) 12.5 MG tablet Take 1 tablet (12.5 mg total) by mouth 2 (two) times daily. 01/16/20   Mayer Masker, PA-C  docusate sodium (COLACE) 100  MG capsule Take 200-300 mg by mouth See admin instructions. 300mg  in the morning and 200mg  at night    [provider]  fluticasone (FLONASE) 50 MCG/ACT nasal spray Place 2 sprays into both nostrils daily. 03/25/20   , PA-C  losartan-hydrochlorothiazide (HYZAAR) 100-12.5 MG tablet Take 1 tablet by mouth daily. 01/02/20   Belinda Fisher, PA-C  meloxicam (MOBIC) 7.5 MG tablet Take 1 tablet (7.5 mg total) by mouth daily. 01/31/20   Mayer Masker, PA-C  Multiple Vitamins-Minerals (MULTIVITAMIN WITH MINERALS) tablet Take 1 tablet by mouth daily.    [provider]  Omega-3 Fatty Acids (FISH OIL) 1200 MG CAPS Take 1,200 mg by mouth daily.    [provider]  ondansetron (ZOFRAN ODT) 4 MG disintegrating tablet Take 1 tablet (4 mg total) by mouth every 8 (eight) hours as needed for nausea or vomiting. 03/25/20   Renne Crigler, Amy V, PA-C  Vitamin D, Ergocalciferol, (DRISDOL) 1.25 MG (50000 UNIT) CAPS capsule Take one tablet wkly 01/02/20   Cathie Hoops, PA-C    Family History Family History  Problem Relation Age of Onset  . Stroke Mother   . Heart disease Mother   . Heart attack Maternal Grandmother        >13s of age  . Hypertension Father   . Diabetes Father   . Cancer Father        bone  . Hyperlipidemia Father     Social History Social History   Tobacco Use  . Smoking status: Former Mayer Masker  . Smokeless tobacco: Never Used  . Tobacco comment: off and on as a teenager - very little per patient   Vaping Use  . Vaping Use: Never used  Substance Use Topics  . Alcohol use: No  . Drug use: No     Allergies   Patient has no known allergies.   Review of Systems Review of Systems  Reason unable to perform ROS: See HPI as above.     Physical Exam Triage Vital Signs ED Triage Vitals  Enc Vitals Group     BP 03/25/20 0833 (!) 137/89     Pulse Rate 03/25/20 0833 79     Resp 03/25/20 0833 20     Temp 03/25/20 0833 99.5 F (37.5 C)     Temp Source 03/25/20 0833 Oral     SpO2 03/25/20 0833 93 %     Weight --      Height --      Head Circumference --      Peak Flow --      Pain Score 03/25/20 0832 9     Pain Loc --      Pain Edu? --      Excl. in GC? --    No data found.  Updated Vital Signs BP (!) 137/89 (BP Location: Left Arm)   Pulse 79   Temp 99.5 F (37.5 C) (Oral)   Resp 20   SpO2 93%   Physical Exam Constitutional:      General: He is not in acute distress.    Appearance: Normal appearance. He is not ill-appearing, toxic-appearing or diaphoretic.  HENT:     Head: Normocephalic and atraumatic.      Mouth/Throat:     Mouth: Mucous membranes are moist.     Pharynx: Oropharynx is clear. Uvula midline.  Cardiovascular:     Rate and Rhythm: Normal rate and regular rhythm.     Heart sounds: Normal heart sounds. No murmur heard.  No friction rub. No gallop.   Pulmonary:     Effort: Pulmonary effort is normal. No accessory muscle usage, prolonged expiration, respiratory distress or retractions.     Comments: Lungs clear to auscultation without adventitious lung sounds.  O2 sat recheck 95% Musculoskeletal:     Cervical back: Normal range of motion and neck supple.  Skin:    General: Skin is warm and dry.  Neurological:     General: No focal deficit present.     Mental Status: He is alert and oriented to person, place, and time.      UC Treatments / Results  Labs (all labs ordered are listed, but only abnormal results are displayed) Labs Reviewed  NOVEL CORONAVIRUS, NAA    EKG   Radiology No results found.  Procedures Procedures (including critical care time)  Medications Ordered in UC Medications - No data to display  Initial Impression / Assessment and Plan / UC Course  I have reviewed the triage vital signs and the nursing notes.  Pertinent labs & imaging results that were available during my care of the patient were reviewed by me and considered in my medical decision making (see chart for details).    COVID PCR test ordered. Patient to quarantine until testing results return. No alarming signs on exam.  Patient speaking in full sentences without respiratory distress. LCTAB.  Symptomatic treatment discussed.  Push fluids.  Return precautions given.  Patient expresses understanding and agrees to plan.  Final Clinical Impressions(s) / UC Diagnoses   Final diagnoses:  Fever, unspecified  Nasal congestion  Cough  Body aches   ED Prescriptions    Medication Sig Dispense Auth. Provider   ondansetron (ZOFRAN ODT) 4 MG disintegrating tablet Take 1 tablet (4 mg  total) by mouth every 8 (eight) hours as needed for nausea or vomiting. 20 tablet Yu, Amy V, PA-C   benzonatate (TESSALON) 200 MG capsule Take 1 capsule (200 mg total) by mouth every 8 (eight) hours. 21 capsule Yu, Amy V, PA-C   fluticasone (FLONASE) 50 MCG/ACT nasal spray Place 2 sprays into both nostrils daily. 1 g Belinda Fisher, PA-C     PDMP not reviewed this encounter.   Belinda Fisher, PA-C 03/25/20 [phone]

## 2020-03-25 NOTE — ED Triage Notes (Addendum)
Patient presents to [facility] for assessment of clamminess, fever of 105, nasal congestion, headache, bodyaches since yesterday. Patient is fully vaccinated

## 2020-03-25 NOTE — ED Notes (Signed)
Patient able to ambulate independently  

## 2020-03-26 LAB — NOVEL CORONAVIRUS, NAA: SARS-CoV-2, NAA: DETECTED — AB

## 2020-03-26 LAB — SARS-COV-2, NAA 2 DAY TAT

## 2020-03-27 ENCOUNTER — Telehealth: Payer: Self-pay | Admitting: Infectious Diseases

## 2020-03-27 NOTE — Telephone Encounter (Signed)
Called to discuss with patient about Covid symptoms and the use of Regeneron, a monoclonal antibody infusion for those with mild to moderate Covid symptoms and at a high risk of hospitalization.  Pt is qualified for this infusion at the [facility] infusion center due to BMI>35.   Message left to call back  MyChart message sent too.

## 2020-03-28 ENCOUNTER — Other Ambulatory Visit: Payer: Self-pay | Admitting: Physician Assistant

## 2020-03-28 DIAGNOSIS — I10 Essential (primary) hypertension: Secondary | ICD-10-CM

## 2020-04-30 ENCOUNTER — Ambulatory Visit: Payer: BC Managed Care – PPO | Admitting: Physician Assistant

## 2020-05-18 ENCOUNTER — Ambulatory Visit (INDEPENDENT_AMBULATORY_CARE_PROVIDER_SITE_OTHER): Payer: BC Managed Care – PPO | Admitting: Physician Assistant

## 2020-05-18 ENCOUNTER — Encounter: Payer: Self-pay | Admitting: Physician Assistant

## 2020-05-18 VITALS — Ht 70.5 in | Wt 285.0 lb

## 2020-05-18 DIAGNOSIS — I10 Essential (primary) hypertension: Secondary | ICD-10-CM

## 2020-05-18 DIAGNOSIS — E785 Hyperlipidemia, unspecified: Secondary | ICD-10-CM | POA: Diagnosis not present

## 2020-05-18 NOTE — Progress Notes (Signed)
Telehealth office visit note for Mayer Masker, PA-C- at Primary Care at [facility]   I connected with current patient today by telephone and verified that I am speaking with the correct person   . Location of the patient: Home . Location of the provider: Office - This visit type was conducted due to national recommendations for restrictions regarding the COVID-19 Pandemic (e.g. social distancing) in an effort to limit this patient's exposure and mitigate transmission in our community.    - No physical exam could be performed with this format, beyond that communicated to [place] by the patient/ family members as noted.   - Additionally my office staff/ schedulers were to discuss with the patient that there may be a monetary charge related to this service, depending on their medical insurance.  My understanding is that patient understood and consented to proceed.     _________________________________________________________________________________   History of Present Illness: Pt calls in to follow up on hypertension and hyperlipidemia. Pt has no acute concerns today.  HTN: Pt denies chest pain, palpitations, dizziness or leg swelling. Taking medication as directed without side effects. Checks BP when he goes to the pharmacy and most recent reading was 127/86. Reports his wife purchased new batteries for their BP device at home and hopefully he can check it more often.  HLD: Pt taking medication as directed without issues. Denies side effects including myalgias and RUQ pain. Reports he has made dietary changes and reduced fried foods. He is eating more fish. He is trying to lose weight.       No flowsheet data found.  Depression screen [facility] 2/9 01/02/2020 02/24/2019 05/09/2018 02/27/2018 02/27/2018  Decreased Interest 0 0 0 0 0  Down, Depressed, Hopeless 0 0 0 0 0  PHQ - 2 Score 0 0 0 0 0  Altered sleeping 0 0 0 0 -  Tired, decreased energy 0 0 0 0 -  Change in appetite 0 0 0 0 -   Feeling bad or failure about yourself  0 0 0 0 -  Trouble concentrating 0 0 0 0 -  Moving slowly or fidgety/restless 0 0 0 0 -  Suicidal thoughts 0 0 0 0 -  PHQ-9 Score 0 0 0 0 -  Difficult doing work/chores - Not difficult at all - Not difficult at all -      Impression and Recommendations:     1. Essential hypertension   2. Dyslipidemia, goal LDL below 100   3. Obesity, Class III, BMI 40-49.9 (morbid obesity) (HCC)     Essential hypertension: -BP stable -Continue current medication regimen. -Follow low sodium diet and stay well hydrated. -Will continue to monitor.  Dyslipidemia:  -Last lipid panel: LDL 125 -Continue current medication regimen. -Continue with dietary changes and reduce saturated and trans fat. Weight loss will also help improve cholesterol. -Stay as active as possible. -Plan to recheck lipid panel and hepatic function with CPE.  Obesity, Class III: -Associated with hypertension, dyslipidemia -Patient has lost 16 pounds since last OV. -Encourage to continue with dietary and lifestyle changes. -Recommend to use Lose It or My Fitness Pal app as adjunct therapy.   - As part of my medical decision making, I reviewed the following data within the electronic MEDICAL RECORD NUMBER History obtained from pt /family, CMA notes reviewed and incorporated if applicable, Labs reviewed, Radiograph/ tests reviewed if applicable and OV notes from prior OV's with me, as well as any other specialists she/he has seen  since seeing me last, were all reviewed and used in my medical decision making process today.    - Additionally, when appropriate, discussion had with patient regarding our treatment plan, and their biases/concerns about that plan were used in my medical decision making today.    - The patient agreed with the plan and demonstrated an understanding of the instructions.   No barriers to understanding were identified.     - The patient was advised to call back or seek  an in-person evaluation if the symptoms worsen or if the condition fails to improve as anticipated.   Return in about 4 months (around 09/17/2020) for CPE and FBW few days prior.    No orders of the defined types were placed in this encounter.   No orders of the defined types were placed in this encounter.   Medications Discontinued During This Encounter  Medication Reason  . benzonatate (TESSALON) 200 MG capsule Patient Preference  . ondansetron (ZOFRAN ODT) 4 MG disintegrating tablet Patient Preference       Time spent on visit including pre-visit chart review and post-visit care was 8 minutes.      The 21st Century Cures Act was signed into law in 2016 which includes the topic of electronic health records.  This provides immediate access to information in MyChart.  This includes consultation notes, operative notes, office notes, lab results and pathology reports.  If you have any questions about what you read please let us know at your next visit or call us at the office.  We are right here with you.  Note:  This note was prepared with assistance of Dragon voice recognition software. Occasional wrong-word or sound-a-like substitutions may have occurred due to the inherent limitations of voice recognition software.  __________________________________________________________________________________     Patient Care Team    Relationship Specialty Notifications Start End  Mayer Masker, [place] PCP - General   12/28/19   Runell Gess, MD Consulting Physician Cardiology  02/27/18   Wynetta Emery., MD Consulting Physician Orthopedic Surgery  02/27/18   Durene Romans, MD Consulting Physician Orthopedic Surgery  02/27/18      -Vitals obtained; medications/ allergies reconciled;  personal medical, social, Sx etc.histories were updated by CMA, reviewed by me and are reflected in chart   Patient Active Problem List   Diagnosis Date Noted  . Elevated serum creatinine  02/24/2019  . Pre-diabetes 02/24/2019  . Vitamin D deficiency 02/24/2019  . Normal coronary arteries 01/02/2019  . NAFLD (nonalcoholic fatty liver disease) 24/58/0998  . Elevated triglycerides with high cholesterol 03/18/2018  . Morbid obesity (HCC) 02/27/2018  . h/o elevated LFT's- dx of Fatty liver disease, nonalcoholic 02/27/2018  . Primary osteoarthritis of left knee 07/30/2017  . 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 S/P left TKA 04/24/2016  . S/P knee replacement 04/24/2016  . Thoracic aortic aneurysm (HCC) 01/07/2016  . Midsternal chest pain 11/14/2015  . Essential hypertension 11/14/2015  . Chest pain 11/11/2015  . History of small bowel obstruction 02/27/2015  . Dyslipidemia, goal LDL below 100 02/27/2015  . Leukocytosis 02/27/2015  . Elevated LFTs 03/07/2012  . GI problem 03/07/2012  . Sleep apnea 03/07/2012     Current Meds  Medication Sig  . amLODipine (NORVASC) 10 MG tablet TAKE 1 TABLET BY MOUTH DAILY  . aspirin EC 81 MG EC tablet Take 1 tablet (81 mg total) by mouth daily.  [name] atorvastatin (LIPITOR) 40 MG tablet Take 0.5 tablets (20 mg  total) by mouth at bedtime.  . carvedilol (COREG) 12.5 MG tablet Take 1 tablet (12.5 mg total) by mouth 2 (two) times daily.  [name] docusate sodium (COLACE) 100 MG capsule Take 200-300 mg by mouth See admin instructions. 300mg  in the morning and 200mg  at night  . losartan-hydrochlorothiazide (HYZAAR) 100-12.5 MG tablet TAKE 1 TABLET BY MOUTH DAILY  . meloxicam (MOBIC) 7.5 MG tablet Take 1 tablet (7.5 mg total) by mouth daily.  . Multiple Vitamins-Minerals (MULTIVITAMIN WITH MINERALS) tablet Take 1 tablet by mouth daily.  . Omega-3 Fatty Acids (FISH OIL) 1200 MG CAPS Take 1,200 mg by mouth daily.  . Vitamin D, Ergocalciferol, (DRISDOL) 1.25 MG (50000 UNIT) CAPS capsule Take one tablet wkly     Allergies:  No Known Allergies   ROS:  See above HPI for pertinent positives and negatives   Objective:    Height 5' 10.5" (1.791 m), weight 285 lb (129.3 kg).  (if some vitals are omitted, this means that patient was UNABLE to obtain them even though they were asked to get them prior to OV today.  They were asked to call at their earliest convenience with these once obtained. ) General: A & O * 3; sounds in no acute distress; in usual state of health.  Respiratory: speaking in full sentences, no conversational dyspnea Psych: insight appears good, mood- appears full

## 2020-05-29 ENCOUNTER — Other Ambulatory Visit: Payer: Self-pay | Admitting: Physician Assistant

## 2020-05-29 DIAGNOSIS — E559 Vitamin D deficiency, unspecified: Secondary | ICD-10-CM

## 2020-06-23 ENCOUNTER — Other Ambulatory Visit: Payer: Self-pay | Admitting: Physician Assistant

## 2020-06-23 DIAGNOSIS — I712 Thoracic aortic aneurysm, without rupture, unspecified: Secondary | ICD-10-CM

## 2020-06-23 DIAGNOSIS — I10 Essential (primary) hypertension: Secondary | ICD-10-CM

## 2020-07-04 ENCOUNTER — Other Ambulatory Visit: Payer: Self-pay | Admitting: Physician Assistant

## 2020-07-04 DIAGNOSIS — I10 Essential (primary) hypertension: Secondary | ICD-10-CM

## 2020-08-11 ENCOUNTER — Other Ambulatory Visit: Payer: Self-pay | Admitting: Family Medicine

## 2020-08-11 DIAGNOSIS — E785 Hyperlipidemia, unspecified: Secondary | ICD-10-CM

## 2020-08-16 ENCOUNTER — Other Ambulatory Visit: Payer: Self-pay

## 2020-08-16 DIAGNOSIS — E785 Hyperlipidemia, unspecified: Secondary | ICD-10-CM

## 2020-08-16 MED ORDER — ATORVASTATIN CALCIUM 40 MG PO TABS: 20.0000 mg | ORAL_TABLET | Freq: Every day | ORAL | 0 refills | Status: DC

## 2020-09-21 ENCOUNTER — Telehealth: Payer: Self-pay | Admitting: Physician Assistant

## 2020-09-21 ENCOUNTER — Other Ambulatory Visit: Payer: Self-pay | Admitting: Physician Assistant

## 2020-09-21 DIAGNOSIS — I10 Essential (primary) hypertension: Secondary | ICD-10-CM

## 2020-09-21 DIAGNOSIS — I712 Thoracic aortic aneurysm, without rupture, unspecified: Secondary | ICD-10-CM

## 2020-09-21 NOTE — Telephone Encounter (Signed)
Please contact patient to schedule apt per last AVS for med refills. AS, CMA 

## 2020-09-21 NOTE — Telephone Encounter (Signed)
Left vm on 09-21-20

## 2020-09-30 ENCOUNTER — Emergency Department (HOSPITAL_COMMUNITY): Payer: BC Managed Care – PPO

## 2020-09-30 ENCOUNTER — Encounter (HOSPITAL_COMMUNITY): Payer: Self-pay | Admitting: *Deleted

## 2020-09-30 ENCOUNTER — Other Ambulatory Visit: Payer: Self-pay

## 2020-09-30 ENCOUNTER — Emergency Department (HOSPITAL_COMMUNITY)
Admission: EM | Admit: 2020-09-30 | Discharge: 2020-09-30 | Disposition: A | Discharge status: Home / Self Care | Payer: BC Managed Care – PPO | Attending: Emergency Medicine | Admitting: Emergency Medicine

## 2020-09-30 DIAGNOSIS — Z79899 Other long term (current) drug therapy: Secondary | ICD-10-CM | POA: Insufficient documentation

## 2020-09-30 DIAGNOSIS — N200 Calculus of kidney: Secondary | ICD-10-CM | POA: Diagnosis not present

## 2020-09-30 DIAGNOSIS — R1010 Upper abdominal pain, unspecified: Secondary | ICD-10-CM | POA: Diagnosis not present

## 2020-09-30 DIAGNOSIS — R198 Other specified symptoms and signs involving the digestive system and abdomen: Secondary | ICD-10-CM | POA: Insufficient documentation

## 2020-09-30 DIAGNOSIS — R197 Diarrhea, unspecified: Secondary | ICD-10-CM | POA: Diagnosis not present

## 2020-09-30 DIAGNOSIS — R1011 Right upper quadrant pain: Secondary | ICD-10-CM | POA: Insufficient documentation

## 2020-09-30 DIAGNOSIS — I774 Celiac artery compression syndrome: Secondary | ICD-10-CM | POA: Diagnosis not present

## 2020-09-30 DIAGNOSIS — Z7982 Long term (current) use of aspirin: Secondary | ICD-10-CM | POA: Insufficient documentation

## 2020-09-30 DIAGNOSIS — R1110 Vomiting, unspecified: Secondary | ICD-10-CM | POA: Diagnosis not present

## 2020-09-30 DIAGNOSIS — Z96652 Presence of left artificial knee joint: Secondary | ICD-10-CM | POA: Insufficient documentation

## 2020-09-30 DIAGNOSIS — Z87891 Personal history of nicotine dependence: Secondary | ICD-10-CM | POA: Diagnosis not present

## 2020-09-30 DIAGNOSIS — K219 Gastro-esophageal reflux disease without esophagitis: Secondary | ICD-10-CM | POA: Diagnosis not present

## 2020-09-30 DIAGNOSIS — N3289 Other specified disorders of bladder: Secondary | ICD-10-CM | POA: Diagnosis not present

## 2020-09-30 DIAGNOSIS — I10 Essential (primary) hypertension: Secondary | ICD-10-CM | POA: Diagnosis not present

## 2020-09-30 DIAGNOSIS — R112 Nausea with vomiting, unspecified: Secondary | ICD-10-CM | POA: Insufficient documentation

## 2020-09-30 LAB — CBC
HCT: 43.9 % (ref 39.0–52.0)
Hemoglobin: 15.4 g/dL (ref 13.0–17.0)
MCH: 32 pg (ref 26.0–34.0)
MCHC: 35.1 g/dL (ref 30.0–36.0)
MCV: 91.3 fL (ref 80.0–100.0)
Platelets: 132 10*3/uL — ABNORMAL LOW (ref 150–400)
RBC: 4.81 MIL/uL (ref 4.22–5.81)
RDW: 13.3 % (ref 11.5–15.5)
WBC: 7.1 10*3/uL (ref 4.0–10.5)
nRBC: 0 % (ref 0.0–0.2)

## 2020-09-30 LAB — URINALYSIS, ROUTINE W REFLEX MICROSCOPIC
Bacteria, UA: NONE SEEN
Bilirubin Urine: NEGATIVE
Glucose, UA: NEGATIVE mg/dL
Ketones, ur: NEGATIVE mg/dL
Leukocytes,Ua: NEGATIVE
Nitrite: NEGATIVE
Protein, ur: NEGATIVE mg/dL
Specific Gravity, Urine: 1.013 (ref 1.005–1.030)
pH: 7 (ref 5.0–8.0)

## 2020-09-30 LAB — COMPREHENSIVE METABOLIC PANEL
ALT: 60 U/L — ABNORMAL HIGH (ref 0–44)
AST: 48 U/L — ABNORMAL HIGH (ref 15–41)
Albumin: 3.6 g/dL (ref 3.5–5.0)
Alkaline Phosphatase: 66 U/L (ref 38–126)
Anion gap: 11 (ref 5–15)
BUN: 15 mg/dL (ref 6–20)
CO2: 26 mmol/L (ref 22–32)
Calcium: 9.4 mg/dL (ref 8.9–10.3)
Chloride: 98 mmol/L (ref 98–111)
Creatinine, Ser: 1.11 mg/dL (ref 0.61–1.24)
GFR, Estimated: >60 mL/min (ref >60)
Glucose, Bld: 112 mg/dL — ABNORMAL HIGH (ref 70–99)
Potassium: 4 mmol/L (ref 3.5–5.1)
Sodium: 135 mmol/L (ref 135–145)
Total Bilirubin: 1.7 mg/dL — ABNORMAL HIGH (ref 0.3–1.2)
Total Protein: 6.9 g/dL (ref 6.5–8.1)

## 2020-09-30 LAB — LIPASE, BLOOD: Lipase: 32 U/L (ref 11–51)

## 2020-09-30 MED ORDER — ONDANSETRON HCL 4 MG/2ML IJ SOLN
4.0000 mg | Freq: Once | INTRAMUSCULAR | Status: AC
  Administered 2020-09-30: 4 mg via INTRAVENOUS
  Filled 2020-09-30: qty 2

## 2020-09-30 MED ORDER — ONDANSETRON 4 MG PO TBDP: 4.0000 mg | ORAL_TABLET | Freq: Three times a day (TID) | ORAL | 0 refills | Status: DC | PRN

## 2020-09-30 MED ORDER — IOHEXOL 300 MG/ML  SOLN
100.0000 mL | Freq: Once | INTRAMUSCULAR | Status: AC | PRN
  Administered 2020-09-30: 100 mL via INTRAVENOUS

## 2020-09-30 MED ORDER — MORPHINE SULFATE (PF) 4 MG/ML IV SOLN
4.0000 mg | Freq: Once | INTRAVENOUS | Status: AC
  Administered 2020-09-30: 4 mg via INTRAVENOUS
  Filled 2020-09-30: qty 1

## 2020-09-30 MED ORDER — DICYCLOMINE HCL 20 MG PO TABS: 20.0000 mg | ORAL_TABLET | Freq: Two times a day (BID) | ORAL | 0 refills | Status: DC | PRN

## 2020-09-30 NOTE — ED Provider Notes (Signed)
[facility] EMERGENCY DEPARTMENT Provider Note   CSN: [id number] Arrival date & time: 09/30/20  4982     History Chief Complaint  Patient presents with  . Abdominal Pain    Keith Vance is a 60 y.o. male presenting for evaluation of nausea, dry heaves, abdominal pain.  Patient states his symptoms began yesterday.  He reports upper abdominal pain, which is constant and gradually worsening.  It began all of a sudden.  At 1st he thought it was heartburn, treated with Tums and baking soda and water without improvement.  Throughout the day, pain worsened.  He reports diarrhea yesterday, and one very small bowel movement today.  He was passing gas yesterday.  Patient states associated nausea and dry heaves.  He has not been able to eat very much.  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  He states his symptoms today feel identical to previous bowel obstruction symptoms.  He has never needed surgery for his bowel obstructions.  He has a history of hypertension and GERD for which he takes medication, no other medical problems.  He is not on blood thinners.  Additional history obtained from chart review.  Patient with a history of hypertension, recurrent SBO, hyperlipidemia, ascending aortic aneurysm stable on last imaging (2020)  HPI     Past Medical History:  Diagnosis Date  . Arthritis   . Ascending aortic aneurysm (HCC)    a. 10/2015 CTA chest: 4.4cm aneurysmal dil of Asc Ao-->rec f/u in 1 yr.  . Chest pain 10/11/2018  . GERD (gastroesophageal reflux disease)    hx  . History of kidney stones   . Hyperlipidemia   . Hypertension   . Pneumonia    hx  . SBO (small bowel obstruction) (HCC)    a. 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  [name] Sleep apnea    cpap    Patient Active Problem List   Diagnosis Date Noted  . Elevated serum creatinine 02/24/2019  . Pre-diabetes 02/24/2019  . Vitamin D  deficiency 02/24/2019  . Normal coronary arteries 01/02/2019  . NAFLD (nonalcoholic fatty liver disease) 64/15/8309  . Elevated triglycerides with high cholesterol 03/18/2018  . Morbid obesity (HCC) 02/27/2018  . h/o elevated LFT's- dx of Fatty liver disease, nonalcoholic 02/27/2018  . Primary osteoarthritis of left knee 07/30/2017  . 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 S/P left TKA 04/24/2016  . S/P knee replacement 04/24/2016  . Thoracic aortic aneurysm (HCC) 01/07/2016  . Midsternal chest pain 11/14/2015  . Essential hypertension 11/14/2015  . Chest pain 11/11/2015  . History of small bowel obstruction 02/27/2015  . Dyslipidemia, goal LDL below 100 02/27/2015  . Leukocytosis 02/27/2015  . Elevated LFTs 03/07/2012  . GI problem 03/07/2012  . Sleep apnea 03/07/2012    Past Surgical History:  Procedure Laterality Date  . HERNIA REPAIR Left    inguinal, and umbilical  . JOINT REPLACEMENT    . REVISION TOTAL KNEE ARTHROPLASTY Left 07/30/2017   LEFT TOTAL KNEE PARTIAL REVISION AND SYNOVECTOMY/notes 07/30/2017  . TOTAL KNEE ARTHROPLASTY Left 04/24/2016   Procedure: LEFT TOTAL KNEE ARTHROPLASTY;  Surgeon: Durene Romans, MD;  Location: [facility] ORS;  Service: Orthopedics;  Laterality: Left;  . TOTAL KNEE REVISION Left 07/30/2017   Procedure: LEFT TOTAL KNEE PARTIAL REVISION AND SYNOVECTOMY;  Surgeon: Gean Birchwood, MD;  Location: [facility] OR;  Service: Orthopedics;  Laterality: Left;  Family History  Problem Relation Age of Onset  . Stroke Mother   . Heart disease Mother   . Heart attack Maternal Grandmother        >70s of age  . Hypertension Father   . Diabetes Father   . Cancer Father        bone  . Hyperlipidemia Father     Social History   Tobacco Use  . Smoking status: Former Games developer  . Smokeless tobacco: Never Used  . Tobacco comment: off and on as a teenager - very little per patient   Vaping Use  . Vaping Use: Never used  Substance Use  Topics  . Alcohol use: No  . Drug use: No    Home Medications Prior to Admission medications   Medication Sig Start Date End Date Taking? Authorizing Provider  dicyclomine (BENTYL) 20 MG tablet Take 1 tablet (20 mg total) by mouth 2 (two) times daily as needed for spasms. 09/30/20  Yes Augustino Savastano, PA-C  ondansetron (ZOFRAN ODT) 4 MG disintegrating tablet Take 1 tablet (4 mg total) by mouth every 8 (eight) hours as needed for nausea or vomiting. 09/30/20  Yes Eldonna Neuenfeldt, PA-C  amLODipine (NORVASC) 10 MG tablet TAKE 1 TABLET BY MOUTH DAILY 12/04/19   Runell Gess, MD  aspirin EC 81 MG EC tablet Take 1 tablet (81 mg total) by mouth daily. 10/13/18   Barnetta Chapel, MD  atorvastatin (LIPITOR) 40 MG tablet Take 0.5 tablets (20 mg total) by mouth at bedtime. 08/16/20   Mayer Masker, PA-C  carvedilol (COREG) 12.5 MG tablet Take 1 tablet (12.5 mg total) by mouth 2 (two) times daily with a meal. **NEEDS APT FOR REFILLS** 09/21/20   Abonza, Maritza, PA-C  docusate sodium (COLACE) 100 MG capsule Take 200-300 mg by mouth See admin instructions. 300mg  in the morning and 200mg  at night    [provider]  fluticasone (FLONASE) 50 MCG/ACT nasal spray Place 2 sprays into both nostrils daily. Patient not taking: Reported on 05/18/2020 03/25/20   05/20/2020, PA-C  losartan-hydrochlorothiazide [facility]) 100-12.5 MG tablet TAKE 1 TABLET BY MOUTH DAILY 07/05/20   [facility], PA-C  meloxicam (MOBIC) 7.5 MG tablet Take 1 tablet (7.5 mg total) by mouth daily. 01/31/20   Mayer Masker, PA-C  Multiple Vitamins-Minerals (MULTIVITAMIN WITH MINERALS) tablet Take 1 tablet by mouth daily.    [provider]  Omega-3 Fatty Acids (FISH OIL) 1200 MG CAPS Take 1,200 mg by mouth daily.    [provider]  Vitamin D, Ergocalciferol, (DRISDOL) 1.25 MG (50000 UNIT) CAPS capsule Take one tablet wkly 01/02/20   Renne Crigler, PA-C    Allergies    Patient has no known allergies.  Review  of Systems   Review of Systems  Gastrointestinal: Positive for abdominal pain, nausea and vomiting.  All other systems reviewed and are negative.   Physical Exam Updated Vital Signs BP 140/89   Pulse 63   Temp 98.1 F (36.7 C)   Resp (!) 24   Ht 5\' 10"  (1.778 m)   Wt 131.5 kg   SpO2 96%   BMI 41.61 kg/m   Physical Exam Vitals and nursing note reviewed.  Constitutional:      General: He is not in acute distress.    Appearance: He is well-developed and well-nourished. He is obese.     Comments: Appears uncomfortable due to pain, otherwise nontoxic  HENT:     Head: Normocephalic and atraumatic.  Eyes:  Extraocular Movements: EOM normal.     Conjunctiva/sclera: Conjunctivae normal.     Pupils: Pupils are equal, round, and reactive to light.  Cardiovascular:     Rate and Rhythm: Normal rate and regular rhythm.     Pulses: Intact distal pulses.  Pulmonary:     Effort: Pulmonary effort is normal. No respiratory distress.     Breath sounds: Normal breath sounds. No wheezing.  Abdominal:     General: Bowel sounds are normal. There is no distension.     Palpations: Abdomen is soft. There is no mass.     Tenderness: There is abdominal tenderness in the right upper quadrant, epigastric area and left upper quadrant. There is no guarding or rebound.     Comments: Diffuse tenderness palpation of the upper abdomen.  No TTP of the lower abdomen.  No rigidity, guarding, distention.  Negative rebound.  Musculoskeletal:        General: Normal range of motion.     Cervical back: Normal range of motion and neck supple.  Skin:    General: Skin is warm and dry.     Capillary Refill: Capillary refill takes less than 2 seconds.  Neurological:     Mental Status: He is alert and oriented to person, place, and time.  Psychiatric:        Mood and Affect: Mood and affect normal.     ED Results / Procedures / Treatments   Labs (all labs ordered are listed, but only abnormal results are  displayed) Labs Reviewed  COMPREHENSIVE METABOLIC PANEL - Abnormal; Notable for the following components:      Result Value   Glucose, Bld 112 (*)    AST 48 (*)    ALT 60 (*)    Total Bilirubin 1.7 (*)    All other components within normal limits  CBC - Abnormal; Notable for the following components:   Platelets 132 (*)    All other components within normal limits  URINALYSIS, ROUTINE W REFLEX MICROSCOPIC - Abnormal; Notable for the following components:   Hgb urine dipstick SMALL (*)    All other components within normal limits  LIPASE, BLOOD    EKG None  Radiology CT ABDOMEN PELVIS W CONTRAST  Result Date: 09/30/2020 CLINICAL DATA:  Nausea/vomiting, suspected bowel obstruction. History of bowel obstruction and hernia repair EXAM: CT ABDOMEN AND PELVIS WITH CONTRAST TECHNIQUE: Multidetector CT imaging of the abdomen and pelvis was performed using the standard protocol following bolus administration of intravenous contrast. CONTRAST:  OMNIPAQUE IOHEXOL 300 MG/ML  SOLN COMPARISON:  10/11/2018 FINDINGS: Lower chest: No pleural or pericardial effusion. Visualized lung bases clear. Hepatobiliary: No focal liver abnormality is seen. No gallstones, gallbladder wall thickening, or biliary dilatation. Pancreas: Unremarkable. No pancreatic ductal dilatation or surrounding inflammatory changes. Spleen: Normal in size without focal abnormality. Adrenals/Urinary Tract: Adrenal glands unremarkable. Bilateral nephrolithiasis, largest 6 mm in the left lower pole. No hydronephrosis. Urinary bladder incompletely distended. Stomach/Bowel: Stomach is nondistended. Small bowel decompressed. Normal appendix. The colon is nondilated, unremarkable. Vascular/Lymphatic: Mild scattered calcified atheromatous plaque in the aorta without aneurysm. 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 No abdominal or pelvic adenopathy. Multiple normal sized bilateral inguinal lymph nodes stable.  Reproductive: Prostate is unremarkable. Other: No ascites.  No free air. Musculoskeletal: No acute or significant osseous findings. IMPRESSION: 1. No acute findings. 2. Bilateral nephrolithiasis without hydronephrosis. 3. 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 Aortic Atherosclerosis (ICD10-I70.0). Electronically  Signed   By: Corlis Leak M.D.   On: 09/30/2020 09:10    Procedures Procedures   Medications Ordered in ED Medications  ondansetron (ZOFRAN) injection 4 mg (4 mg Intravenous Given 09/30/20 0809)  morphine 4 MG/ML injection 4 mg (4 mg Intravenous Given 09/30/20 0809)  iohexol (OMNIPAQUE) 300 MG/ML solution 100 mL (100 mLs Intravenous Contrast Given 09/30/20 0825)    ED Course  I have reviewed the triage vital signs and the nursing notes.  Pertinent labs & imaging results that were available during my care of the patient were reviewed by me and considered in my medical decision making (see chart for details).    MDM Rules/Calculators/A&P                          Patient presenting for evaluation of abdominal pain, nausea, vomiting.  On exam, patient appears uncomfortable due to pain, otherwise nontoxic.  He does have denies palpation of the upper abdomen.  Considering his history of recurrent small bowel obstructions and the fact that he states his symptoms feel the same, concern for SBO.  Also consider pancreatitis.  Consider gastritis.  Consider intestinal infection including enteritis/diverticulitis.  Labs obtained from triage interpreted by me, overall reassuring.  No leukocytosis.  LFTs minimally elevated, however when compared to previous, at baseline.  He has a history of NAFL.  Will obtain CT abdomen pelvis for further evaluation.  CT abdomen pelvis shows no acute findings including bowel obstruction or obvious infection.  On reassessment after medications, patient report significant improvement in symptoms.  I discussed that at this time, there does not appear to be  a life-threatening or emergent condition requiring hospitalization.  Repeat abdominal exam is overall reassuring.  Discussed symptomatic management, follow-up with GI as needed.  At this time, patient appears safe for discharge.  Return precautions given.  Patient states he understands and agrees to plan.  Final Clinical Impression(s) / ED Diagnoses Final diagnoses:  Upper abdominal pain  Non-intractable vomiting with nausea, unspecified vomiting type    Rx / DC Orders ED Discharge Orders         Ordered    ondansetron (ZOFRAN ODT) 4 MG disintegrating tablet  Every 8 hours PRN        09/30/20 0922    dicyclomine (BENTYL) 20 MG tablet  2 times daily PRN        09/30/20 0922           Alveria Apley, PA-C 09/30/20 0932    Pricilla Loveless, MD 10/02/20 1002

## 2020-09-30 NOTE — Discharge Instructions (Addendum)
Continue taking home medications as prescribed.  Use zofran as needed for nausea/vomiting.  Take tylenol as needed for pain.  Use bentyl as needed for abdominal pain.  Follow up with your GI doctor as needed for further evaluation.  Return to the ER with fevers, severe worsening pain, inability to pass gas/have a bowel movement or with any new, worsening, or concerning symptoms.

## 2020-09-30 NOTE — ED Triage Notes (Signed)
abdpain for 24-48 hours with some nausea vomiting.  Hx of bowel obstructions

## 2020-10-20 ENCOUNTER — Ambulatory Visit (INDEPENDENT_AMBULATORY_CARE_PROVIDER_SITE_OTHER): Payer: BC Managed Care – PPO | Admitting: Physician Assistant

## 2020-10-20 ENCOUNTER — Encounter: Payer: Self-pay | Admitting: Physician Assistant

## 2020-10-20 VITALS — Ht 70.5 in | Wt 295.0 lb

## 2020-10-20 DIAGNOSIS — I712 Thoracic aortic aneurysm, without rupture, unspecified: Secondary | ICD-10-CM

## 2020-10-20 DIAGNOSIS — E785 Hyperlipidemia, unspecified: Secondary | ICD-10-CM | POA: Diagnosis not present

## 2020-10-20 DIAGNOSIS — G4700 Insomnia, unspecified: Secondary | ICD-10-CM

## 2020-10-20 DIAGNOSIS — I10 Essential (primary) hypertension: Secondary | ICD-10-CM

## 2020-10-20 DIAGNOSIS — E559 Vitamin D deficiency, unspecified: Secondary | ICD-10-CM

## 2020-10-20 DIAGNOSIS — G4733 Obstructive sleep apnea (adult) (pediatric): Secondary | ICD-10-CM

## 2020-10-20 MED ORDER — LOSARTAN POTASSIUM-HCTZ 100-12.5 MG PO TABS
1.0000 | ORAL_TABLET | Freq: Every day | ORAL | 0 refills | Status: DC
Start: 2020-10-20 — End: 2020-12-03

## 2020-10-20 MED ORDER — AMLODIPINE BESYLATE 10 MG PO TABS: 10.0000 mg | ORAL_TABLET | Freq: Every day | ORAL | 1 refills | Status: DC

## 2020-10-20 MED ORDER — ATORVASTATIN CALCIUM 40 MG PO TABS: 20.0000 mg | ORAL_TABLET | Freq: Every day | ORAL | 1 refills | Status: DC

## 2020-10-20 MED ORDER — VITAMIN D (ERGOCALCIFEROL) 1.25 MG (50000 UNIT) PO CAPS: ORAL_CAPSULE | ORAL | 1 refills | Status: DC

## 2020-10-20 MED ORDER — CARVEDILOL 12.5 MG PO TABS
12.5000 mg | ORAL_TABLET | Freq: Two times a day (BID) | ORAL | 1 refills | Status: DC
Start: 2020-10-20 — End: 2021-04-14

## 2020-10-20 NOTE — Patient Instructions (Signed)

## 2020-10-20 NOTE — Progress Notes (Signed)
Telehealth office visit note for Mayer Masker, PA-C- at [facility]   I connected with current patient today by telephone and verified that I am speaking with the correct person   . Location of the patient: Home . Location of the provider: Office - This visit type was conducted due to national recommendations for restrictions regarding the COVID-19 Pandemic (e.g. social distancing) in an effort to limit this patient's exposure and mitigate transmission in our community.    - No physical exam could be performed with this format, beyond that communicated to [place] by the patient/ family members as noted.   - Additionally my office staff/ schedulers were to discuss with the patient that there may be a monetary charge related to this service, depending on their medical insurance.  My understanding is that patient understood and consented to proceed.     _________________________________________________________________________________   History of Present Illness: Patient comes in to follow-up on hypertension.  Reports has been out of blood pressure medications for 4 days. Denies chest pain, palpitations, shortness of breath, headache, dizziness or lower extremity swelling.  Has been checking blood pressure at home and recent readings have been 150-160/80-90.  Has c/o having trouble with falling and staying asleep. On average gets about 4 hours of sleep/night. Reports compliance with CPAP machine but has been several years since last sleep study and wonder if needs a new machine.       No flowsheet data found.  Depression screen [facility] 2/9 10/20/2020 01/02/2020 02/24/2019 05/09/2018 02/27/2018  Decreased Interest 0 0 0 0 0  Down, Depressed, Hopeless 0 0 0 0 0  PHQ - 2 Score 0 0 0 0 0  Altered sleeping 3 0 0 0 0  Tired, decreased energy 0 0 0 0 0  Change in appetite 1 0 0 0 0  Feeling bad or failure about yourself  0 0 0 0 0  Trouble concentrating 0 0 0 0 0  Moving slowly or  fidgety/restless 0 0 0 0 0  Suicidal thoughts 0 0 0 0 0  PHQ-9 Score 4 0 0 0 0  Difficult doing work/chores - - Not difficult at all - Not difficult at all      Impression and Recommendations:     1. Essential hypertension   2. Thoracic aortic aneurysm without rupture (HCC)   3. Hyperlipidemia, unspecified hyperlipidemia type   4. Vitamin D deficiency   5. Obstructive sleep apnea syndrome   6. Insomnia, unspecified type     Essential hypertension: -Will provide medication refills and recommend to continue ambulatory BP monitoring. If BP continues to remain consistently >140/90 advised to notify the clinic for medication adjustments. Lack of sleep is likely also contributing to increased BP readings. -Recommend to monitor sodium intake and stay well hydrated. -Will continue to monitor and repeat CMP at next visit.  Hyperlipidemia, unspecified hyperlipidemia type: -Last lipid panel: HDL 51, LDL 125 (goal <70) -Continue current medication regimen. Patient unable to tolerate full dose of Lipitor 40 mg (joint pain). Last hepatic function normal. -Will repeat lipid panel at next visit and if LDL continues to remain above goal then will consider switching Lipitor to Crestor. -Continue with weight loss efforts, follow a diet low in saturated and trans fats.   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 -Followed by Cardiology.  Vitamin D deficiency: -Last Vitamin D 29.5, mildly low. -Continue  current medication regimen. Provided refill. -Will repeat Vitamin D next visit.  OSA: -Will place order for new sleep study to evaluate for potential changes, which could be contributing to insomnia.  Insomnia: -Recommend to trial melatonin time released, limit caffeine use and establish a good sleep hygiene.     - As part of my medical decision making, I reviewed the following data within the electronic medical  record:  History obtained from pt /family, CMA notes reviewed and incorporated if applicable, Labs reviewed, Radiograph/ tests reviewed if applicable and OV notes from prior OV's with me, as well as any other specialists she/he has seen since seeing me last, were all reviewed and used in my medical decision making process today.    - Additionally, when appropriate, discussion had with patient regarding our treatment plan, and their biases/concerns about that plan were used in my medical decision making today.    - The patient agreed with the plan and demonstrated an understanding of the instructions.   No barriers to understanding were identified.     - The patient was advised to call back or seek an in-person evaluation if the symptoms worsen or if the condition fails to improve as anticipated.   Return for HTN, HLD, insomnia in 3-4 months and FBW inc Vit d.    Orders Placed This Encounter  Procedures  . Ambulatory referral to Sleep Studies  . PSG Sleep Study    Meds ordered this encounter  Medications  . carvedilol (COREG) 12.5 MG tablet    Sig: Take 1 tablet (12.5 mg total) by mouth 2 (two) times daily with a meal.    Dispense:  180 tablet    Refill:  1  . amLODipine (NORVASC) 10 MG tablet    Sig: Take 1 tablet (10 mg total) by mouth daily.    Dispense:  90 tablet    Refill:  1  . atorvastatin (LIPITOR) 40 MG tablet    Sig: Take 0.5 tablets (20 mg total) by mouth at bedtime.    Dispense:  45 tablet    Refill:  1  . losartan-hydrochlorothiazide (HYZAAR) 100-12.5 MG tablet    Sig: Take 1 tablet by mouth daily.    Dispense:  90 tablet    Refill:  0  . Vitamin D, Ergocalciferol, (DRISDOL) 1.25 MG (50000 UNIT) CAPS capsule    Sig: Take one tablet wkly    Dispense:  12 capsule    Refill:  1    Medications Discontinued During This Encounter  Medication Reason  . amLODipine (NORVASC) 10 MG tablet Reorder  . Vitamin D, Ergocalciferol, (DRISDOL) 1.25 MG (50000 UNIT) CAPS capsule  Reorder  . losartan-hydrochlorothiazide (HYZAAR) 100-12.5 MG tablet Reorder  . atorvastatin (LIPITOR) 40 MG tablet Reorder  . carvedilol (COREG) 12.5 MG tablet Reorder       Time spent on telephone encounter was 15 minutes.      The 21st Century Cures Act was signed into law in 2016 which includes the topic of electronic health records.  This provides immediate access to information in MyChart.  This includes consultation notes, operative notes, office notes, lab results and pathology reports.  If you have any questions about what you read please let us know at your next visit or call us at the office.  We are right here with you.  Note:  This note was prepared with assistance of Dragon voice recognition software. Occasional wrong-word or sound-a-like substitutions may have occurred due to the inherent limitations of voice  recognition software.  __________________________________________________________________________________     Patient Care Team    Relationship Specialty Notifications Start End  Mayer Masker, [place] PCP - General   12/28/19   Runell Gess, MD Consulting Physician Cardiology  02/27/18   Wynetta Emery., MD Consulting Physician Orthopedic Surgery  02/27/18   Durene Romans, MD Consulting Physician Orthopedic Surgery  02/27/18      -Vitals obtained; medications/ allergies reconciled;  personal medical, social, Sx etc.histories were updated by CMA, reviewed by me and are reflected in chart   Patient Active Problem List   Diagnosis Date Noted  . Elevated serum creatinine 02/24/2019  . Pre-diabetes 02/24/2019  . Vitamin D deficiency 02/24/2019  . Normal coronary arteries 01/02/2019  . NAFLD (nonalcoholic fatty liver disease) 53/61/4431  . Elevated triglycerides with high cholesterol 03/18/2018  . Morbid obesity (HCC) 02/27/2018  . h/o elevated LFT's- dx of Fatty liver disease, nonalcoholic 02/27/2018  . Primary osteoarthritis of left knee 07/30/2017  .  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 S/P left TKA 04/24/2016  . S/P knee replacement 04/24/2016  . Thoracic aortic aneurysm (HCC) 01/07/2016  . Midsternal chest pain 11/14/2015  . Essential hypertension 11/14/2015  . Chest pain 11/11/2015  . History of small bowel obstruction 02/27/2015  . Dyslipidemia, goal LDL below 100 02/27/2015  . Leukocytosis 02/27/2015  . Elevated LFTs 03/07/2012  . GI problem 03/07/2012  . Sleep apnea 03/07/2012     Current Meds  Medication Sig  . aspirin EC 81 MG EC tablet Take 1 tablet (81 mg total) by mouth daily.  [name] docusate sodium (COLACE) 100 MG capsule Take 200-300 mg by mouth See admin instructions. 300mg  in the morning and 200mg  at night  . Multiple Vitamins-Minerals (MULTIVITAMIN WITH MINERALS) tablet Take 1 tablet by mouth daily.  . Omega-3 Fatty Acids (FISH OIL) 1200 MG CAPS Take 1,200 mg by mouth daily.  . ondansetron (ZOFRAN ODT) 4 MG disintegrating tablet Take 1 tablet (4 mg total) by mouth every 8 (eight) hours as needed for nausea or vomiting.  . [DISCONTINUED] amLODipine (NORVASC) 10 MG tablet TAKE 1 TABLET BY MOUTH DAILY  . [DISCONTINUED] atorvastatin (LIPITOR) 40 MG tablet Take 0.5 tablets (20 mg total) by mouth at bedtime.  . [DISCONTINUED] carvedilol (COREG) 12.5 MG tablet Take 1 tablet (12.5 mg total) by mouth 2 (two) times daily with a meal. **NEEDS APT FOR REFILLS**  . [DISCONTINUED] losartan-hydrochlorothiazide (HYZAAR) 100-12.5 MG tablet TAKE 1 TABLET BY MOUTH DAILY  . [DISCONTINUED] Vitamin D, Ergocalciferol, (DRISDOL) 1.25 MG (50000 UNIT) CAPS capsule Take one tablet wkly     Allergies:  No Known Allergies   ROS:  See above HPI for pertinent positives and negatives   Objective:   Height 5' 10.5" (1.791 m), weight 295 lb (133.8 kg).  (if some vitals are omitted, this means that patient was UNABLE to obtain them.) General: A & O * 3; sounds in no acute distress Respiratory: speaking in full  sentences, no conversational dyspnea Psych: insight appears good, mood- appears full

## 2020-11-11 ENCOUNTER — Other Ambulatory Visit: Payer: Self-pay | Admitting: Cardiovascular Disease

## 2020-11-11 DIAGNOSIS — I10 Essential (primary) hypertension: Secondary | ICD-10-CM

## 2020-11-13 ENCOUNTER — Other Ambulatory Visit: Payer: Self-pay | Admitting: Cardiovascular Disease

## 2020-11-13 DIAGNOSIS — I10 Essential (primary) hypertension: Secondary | ICD-10-CM

## 2020-12-03 ENCOUNTER — Other Ambulatory Visit: Payer: Self-pay

## 2020-12-03 ENCOUNTER — Encounter: Payer: Self-pay | Admitting: Cardiovascular Disease

## 2020-12-03 ENCOUNTER — Ambulatory Visit (INDEPENDENT_AMBULATORY_CARE_PROVIDER_SITE_OTHER): Payer: BC Managed Care – PPO | Admitting: Cardiovascular Disease

## 2020-12-03 DIAGNOSIS — E785 Hyperlipidemia, unspecified: Secondary | ICD-10-CM

## 2020-12-03 DIAGNOSIS — I712 Thoracic aortic aneurysm, without rupture, unspecified: Secondary | ICD-10-CM

## 2020-12-03 DIAGNOSIS — G4733 Obstructive sleep apnea (adult) (pediatric): Secondary | ICD-10-CM

## 2020-12-03 DIAGNOSIS — R931 Abnormal findings on diagnostic imaging of heart and coronary circulation: Secondary | ICD-10-CM | POA: Diagnosis not present

## 2020-12-03 DIAGNOSIS — I10 Essential (primary) hypertension: Secondary | ICD-10-CM | POA: Diagnosis not present

## 2020-12-03 LAB — BASIC METABOLIC PANEL
BUN/Creatinine Ratio: 17 (ref 9–20)
BUN: 17 mg/dL (ref 6–24)
CO2: 23 mmol/L (ref 20–29)
Calcium: 9 mg/dL (ref 8.7–10.2)
Chloride: 100 mmol/L (ref 96–106)
Creatinine, Ser: 1.01 mg/dL (ref 0.76–1.27)
Glucose: 109 mg/dL — ABNORMAL HIGH (ref 65–99)
Potassium: 3.6 mmol/L (ref 3.5–5.2)
Sodium: 139 mmol/L (ref 134–144)
eGFR: 86 mL/min/{1.73_m2} (ref >59)

## 2020-12-03 LAB — HEPATIC FUNCTION PANEL
ALT: 67 IU/L — ABNORMAL HIGH (ref 0–44)
AST: 58 IU/L — ABNORMAL HIGH (ref 0–40)
Albumin: 4.5 g/dL (ref 3.8–4.9)
Alkaline Phosphatase: 121 IU/L (ref 44–121)
Bilirubin Total: 0.8 mg/dL (ref 0.0–1.2)
Bilirubin, Direct: 0.26 mg/dL (ref 0.00–0.40)
Total Protein: 7.4 g/dL (ref 6.0–8.5)

## 2020-12-03 LAB — LIPID PANEL
Chol/HDL Ratio: 3.3 ratio (ref 0.0–5.0)
Cholesterol, Total: 169 mg/dL (ref 100–199)
HDL: 51 mg/dL (ref >39)
LDL Chol Calc (NIH): 89 mg/dL (ref 0–99)
Triglycerides: 172 mg/dL — ABNORMAL HIGH (ref 0–149)
VLDL Cholesterol Cal: 29 mg/dL (ref 5–40)

## 2020-12-03 NOTE — Assessment & Plan Note (Signed)
Coronary calcium score of 37 with nonobstructive plaque in his LAD performed 11/26/2019.  He is asymptomatic.  Based on that his target LDL goal should be less than 70.

## 2020-12-03 NOTE — Assessment & Plan Note (Signed)
History of essential hypertension blood pressure measured today 150/88.  He is on amlodipine and carvedilol.  I am going to have him keep a 30-day blood pressure log and see a Pharm.D. after that to review make appropriate recommendations.  He is aware of salt avoidance.

## 2020-12-03 NOTE — Assessment & Plan Note (Signed)
History of dyslipidemia on atorvastatin 20 mg a day with lipid profile performed 01/02/2020 revealing total cholesterol 196, LDL 125 and HDL of 51.  I am going to repeat a lipid liver profile today.  Given his elevated coronary calcium score his LDL goal is less than 70.

## 2020-12-03 NOTE — Assessment & Plan Note (Signed)
History of obstructive sleep apnea on CPAP. 

## 2020-12-03 NOTE — Progress Notes (Signed)
12/03/2020 Keith Vance   08-06-1961  [id number]  Primary Physician Lorrene Reid, PA-C Primary Cardiologist: Lorretta Harp MD Lupe Carney, [place]  HPI:  Keith Vance is a 60 y.o.  severely overweight married Caucasian male father of one Doristine Section I last saw in the office2/24/2021.I met him during a hospitalization for atypical chest pain and small bowel obstruction. He had a remote cardiac catheterization at [facility] revealing normal coronary arteries. This was Dr. Vernona Rieger hypertension. He's never had a heart attack or stroke. There is no family history A 2-D echo was entirely normal during his recent consultation as was a Myoview stress test. His small bowel obstruction resolved with conservative therapy. He denies chest pain. He had a uncomplicated left total knee replacement by Dr. Alvan Dame 04/24/16.He also has a small thoracic aortic aneurysm which we have been following by CTA  He was admitted overnight on Valentine's Day 2019 for chest pain. He ruled out for myocardial infarction. Echocardiogram was essentially normal. CTA revealed stable thoracic aortic aneurysm measuring 4.4 cm which will follow on annual basis.  Since I saw him a year ago he is continue to do well.    He is having trouble losing weight.  He denies chest pain or shortness of breath.  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  Also noted was a 44 mm thoracic aortic aneurysm which had remained stable.   Current Meds  Medication Sig  . amLODipine (NORVASC) 10 MG tablet Take 1 tablet (10 mg total) by mouth daily.  [name] aspirin EC 81 MG EC tablet Take 1 tablet (81 mg total) by mouth daily.  [name] atorvastatin (LIPITOR) 40 MG tablet Take 0.5 tablets (20 mg total) by mouth at bedtime.  . carvedilol (COREG) 12.5 MG tablet Take 1 tablet (12.5 mg total) by mouth 2 (two) times daily with a meal.  . Multiple  Vitamins-Minerals (MULTIVITAMIN WITH MINERALS) tablet Take 1 tablet by mouth daily.  . Omega-3 Fatty Acids (FISH OIL) 1200 MG CAPS Take 1,200 mg by mouth daily.  . ondansetron (ZOFRAN ODT) 4 MG disintegrating tablet Take 1 tablet (4 mg total) by mouth every 8 (eight) hours as needed for nausea or vomiting.  . Vitamin D, Ergocalciferol, (DRISDOL) 1.25 MG (50000 UNIT) CAPS capsule Take one tablet wkly  . [DISCONTINUED] dicyclomine (BENTYL) 20 MG tablet Take 1 tablet (20 mg total) by mouth 2 (two) times daily as needed for spasms.  . [DISCONTINUED] docusate sodium (COLACE) 100 MG capsule Take 200-300 mg by mouth See admin instructions. 316m in the morning and 2060mat night  . [DISCONTINUED] fluticasone (FLONASE) 50 MCG/ACT nasal spray Place 2 sprays into both nostrils daily.  . [DISCONTINUED] losartan-hydrochlorothiazide (HYZAAR) 100-12.5 MG tablet Take 1 tablet by mouth daily.  . [DISCONTINUED] meloxicam (MOBIC) 7.5 MG tablet Take 1 tablet (7.5 mg total) by mouth daily.     No Known Allergies  Social History   Socioeconomic History  . Marital status: Married    Spouse name: Not on file  . Number of children: Not on file  . Years of education: Not on file  . Highest education level: Not on file  Occupational History  . Not on file  Tobacco Use  . Smoking status: Former SmResearch scientist (life sciences). Smokeless tobacco: Never Used  . Tobacco comment: off and on as a teenager - very little per patient   Vaping  Use  . Vaping Use: Never used  Substance and Sexual Activity  . Alcohol use: No  . Drug use: No  . Sexual activity: Yes  Other Topics Concern  . Not on file  Social History Narrative  . Not on file   Social Determinants of Health   Financial Resource Strain: Not on file  Food Insecurity: Not on file  Transportation Needs: Not on file  Physical Activity: Not on file  Stress: Not on file  Social Connections: Not on file  Intimate Partner Violence: Not on file     Review of  Systems: General: negative for chills, fever, night sweats or weight changes.  Cardiovascular: negative for chest pain, dyspnea on exertion, edema, orthopnea, palpitations, paroxysmal nocturnal dyspnea or shortness of breath Dermatological: negative for rash Respiratory: negative for cough or wheezing Urologic: negative for hematuria Abdominal: negative for nausea, vomiting, diarrhea, bright red blood per rectum, melena, or hematemesis Neurologic: negative for visual changes, syncope, or dizziness All other systems reviewed and are otherwise negative except as noted above.    Blood pressure (!) 150/88, pulse (!) 59, height _0  (1.778 m), weight 295 lb (133.8 kg), SpO2 94 %.  General appearance: alert and no distress Neck: no adenopathy, no carotid bruit, no JVD, supple, symmetrical, trachea midline and thyroid not enlarged, symmetric, no tenderness/mass/nodules Lungs: clear to auscultation bilaterally Heart: regular rate and rhythm, S1, S2 normal, no murmur, click, rub or gallop Extremities: extremities normal, atraumatic, no cyanosis or edema Pulses: 2+ and symmetric Skin: Skin color, texture, turgor normal. No rashes or lesions Neurologic: Alert and oriented X 3, normal strength and tone. Normal symmetric reflexes. Normal coordination and gait  EKG sinus bradycardia 59 with left axis deviation.  Personally reviewed this EKG.  ASSESSMENT AND PLAN:   Dyslipidemia, goal LDL below 100 History of dyslipidemia on atorvastatin 20 mg a day with lipid profile performed 01/02/2020 revealing total cholesterol 196, LDL 125 and HDL of 51.  I am going to repeat a lipid liver profile today.  Given his elevated coronary calcium score his LDL goal is less than 70.  Essential hypertension History of essential hypertension blood pressure measured today 150/88.  He is on amlodipine and carvedilol.  I am going to have him keep a 30-day blood pressure log and see a Pharm.D. after that to review make  appropriate recommendations.  He is aware of salt avoidance.  Thoracic aortic aneurysm [facility]) History of thoracic aortic aneurysm measuring 44 mm seen on chest CTA 11/26/2019.  We will recheck a chest CTA.  Sleep apnea History of obstructive sleep apnea on CPAP  Elevated coronary artery calcium score Coronary calcium score of 37 with nonobstructive plaque in his LAD performed 11/26/2019.  He is asymptomatic.  Based on that his target LDL goal should be less than 70.      Lorretta Harp MD FACP,FACC,FAHA, [facility] 12/03/2020 9:22 AM

## 2020-12-03 NOTE — Assessment & Plan Note (Signed)
History of thoracic aortic aneurysm measuring 44 mm seen on chest CTA 11/26/2019.  We will recheck a chest CTA.

## 2020-12-03 NOTE — Patient Instructions (Signed)
Medication Instructions:  Your physician recommends that you continue on your current medications as directed. Please refer to the Current Medication list given to you today.  *If you need a refill on your cardiac medications before your next appointment, please call your pharmacy*   Lab Work: Your physician recommends that you have labs drawn today: BMET, lipid/liver profile.  If you have labs (blood work) drawn today and your tests are completely normal, you will receive your results only by: [name] MyChart Message (if you have MyChart) OR . A paper copy in the mail If you have any lab test that is abnormal or we need to change your treatment, we will call you to review the results.   Testing/Procedures: Non-Cardiac CT Angiography (CTA), is a special type of CT scan that uses a computer to produce multi-dimensional views of major blood vessels throughout the body. In CT angiography, a contrast material is injected through an IV to help visualize the blood vessels This procedure is done at [address] Floor   Follow-Up: At [facility], you and your health needs are our priority.  As part of our continuing mission to provide you with exceptional heart care, we have created designated Provider Care Teams.  These Care Teams include your primary Cardiologist (physician) and Advanced Practice Providers (APPs -  Physician Assistants and Nurse Practitioners) who all work together to provide you with the care you need, when you need it.  We recommend signing up for the patient portal called "MyChart".  Sign up information is provided on this After Visit Summary.  MyChart is used to connect with patients for Virtual Visits (Telemedicine).  Patients are able to view lab/test results, encounter notes, upcoming appointments, etc.  Non-urgent messages can be sent to your provider as well.   To learn more about what you can do with MyChart, go to [email or web address].    Your next  appointment:   6 month(s)  The format for your next appointment:   In Person  Provider:   Nanetta Batty, MD   Other Instructions Referral made to Dr. Dalbert Garnet in the diet and weight loss management center.  Please keep a 30 day blood pressure log and return in 1 month to review with PharmD for medication management.

## 2020-12-16 ENCOUNTER — Telehealth: Payer: Self-pay

## 2020-12-16 ENCOUNTER — Ambulatory Visit (INDEPENDENT_AMBULATORY_CARE_PROVIDER_SITE_OTHER)
Admission: RE | Admit: 2020-12-16 | Discharge: 2020-12-16 | Disposition: A | Discharge status: Home / Self Care | Payer: BC Managed Care – PPO | Source: Ambulatory Visit | Attending: Cardiovascular Disease | Admitting: Cardiovascular Disease

## 2020-12-16 ENCOUNTER — Other Ambulatory Visit: Payer: Self-pay

## 2020-12-16 DIAGNOSIS — I712 Thoracic aortic aneurysm, without rupture, unspecified: Secondary | ICD-10-CM

## 2020-12-16 DIAGNOSIS — R911 Solitary pulmonary nodule: Secondary | ICD-10-CM | POA: Diagnosis not present

## 2020-12-16 DIAGNOSIS — E785 Hyperlipidemia, unspecified: Secondary | ICD-10-CM

## 2020-12-16 MED ORDER — ATORVASTATIN CALCIUM 40 MG PO TABS: 40.0000 mg | ORAL_TABLET | Freq: Every day | ORAL | 3 refills | Status: DC

## 2020-12-16 MED ORDER — IOHEXOL 350 MG/ML SOLN
100.0000 mL | Freq: Once | INTRAVENOUS | Status: AC | PRN
  Administered 2020-12-16: 100 mL via INTRAVENOUS

## 2020-12-16 NOTE — Telephone Encounter (Signed)
Spoke with pt returning call regarding recent lipid lab results. Instructed pt to increase atorvastatin to 40mg  once daily and we would repeat labs in 2-3 months to re-check lipid levels. All questions answered and pt verbalizes understanding.

## 2020-12-16 NOTE — Telephone Encounter (Signed)
Left detailed message (ok per DPR) for pt to increase atorvastatin from 20mg  to 40mg  daily and we will recheck labs in 2-3 months. Instructed pt to call back with any questions.

## 2021-01-04 ENCOUNTER — Ambulatory Visit: Payer: BC Managed Care – PPO

## 2021-01-11 ENCOUNTER — Ambulatory Visit (INDEPENDENT_AMBULATORY_CARE_PROVIDER_SITE_OTHER): Payer: BC Managed Care – PPO | Admitting: Pharmacist

## 2021-01-11 ENCOUNTER — Other Ambulatory Visit: Payer: Self-pay

## 2021-01-11 VITALS — BP 128/84 | HR 60

## 2021-01-11 DIAGNOSIS — E785 Hyperlipidemia, unspecified: Secondary | ICD-10-CM

## 2021-01-11 DIAGNOSIS — I712 Thoracic aortic aneurysm, without rupture, unspecified: Secondary | ICD-10-CM

## 2021-01-11 NOTE — Patient Instructions (Addendum)
Your Results:             Your most recent labs Goal  Total Cholesterol 169 < 200  Triglycerides 172 < 150  HDL (good cholesterol) 51 > 40  LDL (bad cholesterol) 89 < 70      Medication changes: *CONTINUE TAKING ALL MEDICATION AS PRESCRIBED, PLAN TO START EZETIMIBE 10MG  OR REPATHA 140MG  IF NEXT LDL RESULT REMAINS ABOVE 70.  Lab orders: REPEAT FASTING BLOOD WORK IN 4 WEEKS  Thank you for choosing [facility]

## 2021-01-11 NOTE — Progress Notes (Signed)
Patient ID: ADEEB KONECNY                 DOB: [birth year]                    MRN: [id number]     HPI: Keith Vance is a 60 y.o. male patient referred to lipid clinic by Dr Allyson Sabal. PMH is significant for dyslipidemia, hypertension, thoracic aortic aneurysm, sleep apnea, and elevated calcium score (non-obstructive plaque in his LAD).  Patient presents to clinic to discuss possible PCSK9i therapy. Currently on atorvastatin 40mg  daily. Denis problems with current medication and reports compliance. Noted atorvastatin dose was increased 4 weeks go, and no repeat blood work done yet.  Current Medications:  Atorvastatin 40mg  daily - increased from 20mg  4 weeks ago Omega-3 fatty acids 1,200mg  daily  LDL goal: < 70 mg/dL  Diet: low sodium, increased water, decreased soda  Exercise: activities of daily living. Unable to exercise d/t knee problems and chronic knee pain.  Social History: former smoker, denies alcohol use  Labs: 12/03/2020: CHO 169, TG 172, HDL 51, LDL-c 89 (on atorvastatin 20mg  daily)  Past Medical History:  Diagnosis Date  . Arthritis   . Ascending aortic aneurysm (HCC)    a. 10/2015 CTA chest: 4.4cm aneurysmal dil of Asc Ao-->rec f/u in 1 yr.  . Chest pain 10/11/2018  . GERD (gastroesophageal reflux disease)    hx  . History of kidney stones   . Hyperlipidemia   . Hypertension   . Pneumonia    hx  . SBO (small bowel obstruction) (HCC)    a. 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  Sleep apnea    cpap    Current Outpatient Medications on File Prior to Visit  Medication Sig Dispense Refill  . amLODipine (NORVASC) 10 MG tablet Take 1 tablet (10 mg total) by mouth daily. 90 tablet 3  . aspirin EC 81 MG EC tablet Take 1 tablet (81 mg total) by mouth daily. 30 tablet 0  . atorvastatin (LIPITOR) 40 MG tablet Take 1 tablet (40 mg total) by mouth at bedtime. 90 tablet 3  . carvedilol (COREG) 12.5 MG tablet Take 1 tablet (12.5 mg total) by mouth 2 (two) times daily with a meal. 180 tablet 1  .  Multiple Vitamins-Minerals (MULTIVITAMIN WITH MINERALS) tablet Take 1 tablet by mouth daily.    . Omega-3 Fatty Acids (FISH OIL) 1200 MG CAPS Take 1,200 mg by mouth daily.    . Vitamin D, Ergocalciferol, (DRISDOL) 1.25 MG (50000 UNIT) CAPS capsule Take one tablet wkly 12 capsule 1  . ondansetron (ZOFRAN ODT) 4 MG disintegrating tablet Take 1 tablet (4 mg total) by mouth every 8 (eight) hours as needed for nausea or vomiting. (Patient not taking: Reported on 01/11/2021) 20 tablet 0   No current facility-administered medications on file prior to visit.    No Known Allergies  Dyslipidemia, goal LDL below 70 LDL above goal for secondary prevention. Noted atorvastatin dose was increased from 20mg  daily to 40mg  daily on April/2022. Patient reports compliance with medication and denies side effects. Also noted he is currently working on weight loss.  Will continue atorvastatin 40mg  daily for now. Plan to repeat fasting blood work in 3-4 weeks, start start ezetimibe 10mg  and/or Repatha therapy if LDL remains above 70mg /dL.   Alam Guterrez Rodriguez-Guzman PharmD, BCPS, CPP [facility] [address] [place] 01/13/2021 1:19 PM

## 2021-01-13 ENCOUNTER — Encounter: Payer: Self-pay | Admitting: Pharmacist

## 2021-01-13 NOTE — Assessment & Plan Note (Signed)
LDL above goal for secondary prevention. Noted atorvastatin dose was increased from 20mg  daily to 40mg  daily on April/2022. Patient reports compliance with medication and denies side effects. Also noted he is currently working on weight loss.  Will continue atorvastatin 40mg  daily for now. Plan to repeat fasting blood work in 3-4 weeks, start start ezetimibe 10mg  and/or Repatha therapy if LDL remains above 70mg /dL.

## 2021-01-16 ENCOUNTER — Other Ambulatory Visit: Payer: Self-pay | Admitting: Physician Assistant

## 2021-01-16 DIAGNOSIS — I10 Essential (primary) hypertension: Secondary | ICD-10-CM

## 2021-01-20 ENCOUNTER — Other Ambulatory Visit: Payer: Self-pay | Admitting: Physician Assistant

## 2021-01-20 DIAGNOSIS — E559 Vitamin D deficiency, unspecified: Secondary | ICD-10-CM

## 2021-01-20 NOTE — Telephone Encounter (Signed)
Patient scheduled.

## 2021-01-20 NOTE — Telephone Encounter (Signed)
Please reach out to patient to schedule a follow up appt. Per his last AVS, he needed to follow 3-4 months after his 10/20/2020 visit for HTN, HLD, insomnia, and FBW with Vit D. -WA, CMA

## 2021-01-25 ENCOUNTER — Other Ambulatory Visit: Payer: Self-pay | Admitting: Physician Assistant

## 2021-01-25 DIAGNOSIS — I10 Essential (primary) hypertension: Secondary | ICD-10-CM

## 2021-02-07 ENCOUNTER — Telehealth: Payer: Self-pay

## 2021-02-07 DIAGNOSIS — E785 Hyperlipidemia, unspecified: Secondary | ICD-10-CM

## 2021-02-07 NOTE — Telephone Encounter (Signed)
Pt called in to state that yes he is taking lipitor and that doctors office is drawing labs and that he will ask them to draw a lipid panel for [place] here as I requested and he pt voiced understanding that he has to be fasting

## 2021-02-07 NOTE — Telephone Encounter (Signed)
-----   Message from Raquel Rodriguez-Guzman, RPH-CPP sent at 02/07/2021  8:35 AM EDT ----- Regarding: FW: LIPID Please call and check if patient is taking atorvastatin 40mg  daily.  Due to repeat fating blood work.  Thanks  ----- Message ----- From: , RPH-CPP Sent: 02/07/2021  12:00 AM EDT To: Raquel Rodriguez-Guzman, RPH-CPP Subject: LIPID                                          REPEAT FASTING BLOOD WORK ( 2 MONTHS ON ATORVASTATIN 40MG )

## 2021-02-07 NOTE — Telephone Encounter (Signed)
Called and lmom pt asking if they are taking lipitor and that if they are they need to complete fasting blood work no appt needed and labs ordered

## 2021-02-08 ENCOUNTER — Encounter (INDEPENDENT_AMBULATORY_CARE_PROVIDER_SITE_OTHER): Payer: Self-pay | Admitting: Family Medicine

## 2021-02-08 ENCOUNTER — Ambulatory Visit (INDEPENDENT_AMBULATORY_CARE_PROVIDER_SITE_OTHER): Payer: BC Managed Care – PPO | Admitting: Family Medicine

## 2021-02-08 ENCOUNTER — Other Ambulatory Visit: Payer: Self-pay

## 2021-02-08 VITALS — BP 151/92 | HR 59 | Temp 98.0°F | Ht 69.0 in | Wt 276.0 lb

## 2021-02-08 DIAGNOSIS — Z9189 Other specified personal risk factors, not elsewhere classified: Secondary | ICD-10-CM | POA: Diagnosis not present

## 2021-02-08 DIAGNOSIS — E559 Vitamin D deficiency, unspecified: Secondary | ICD-10-CM

## 2021-02-08 DIAGNOSIS — R7303 Prediabetes: Secondary | ICD-10-CM | POA: Diagnosis not present

## 2021-02-08 DIAGNOSIS — R0602 Shortness of breath: Secondary | ICD-10-CM | POA: Diagnosis not present

## 2021-02-08 DIAGNOSIS — G4733 Obstructive sleep apnea (adult) (pediatric): Secondary | ICD-10-CM

## 2021-02-08 DIAGNOSIS — Z0289 Encounter for other administrative examinations: Secondary | ICD-10-CM

## 2021-02-08 DIAGNOSIS — Z1331 Encounter for screening for depression: Secondary | ICD-10-CM | POA: Diagnosis not present

## 2021-02-08 DIAGNOSIS — E65 Localized adiposity: Secondary | ICD-10-CM | POA: Diagnosis not present

## 2021-02-08 DIAGNOSIS — E66813 Obesity, class 3: Secondary | ICD-10-CM

## 2021-02-08 DIAGNOSIS — I10 Essential (primary) hypertension: Secondary | ICD-10-CM

## 2021-02-08 DIAGNOSIS — Z6841 Body Mass Index (BMI) 40.0 and over, adult: Secondary | ICD-10-CM

## 2021-02-08 DIAGNOSIS — E785 Hyperlipidemia, unspecified: Secondary | ICD-10-CM

## 2021-02-08 DIAGNOSIS — M25562 Pain in left knee: Secondary | ICD-10-CM

## 2021-02-08 DIAGNOSIS — R6889 Other general symptoms and signs: Secondary | ICD-10-CM | POA: Diagnosis not present

## 2021-02-08 DIAGNOSIS — K760 Fatty (change of) liver, not elsewhere classified: Secondary | ICD-10-CM

## 2021-02-08 DIAGNOSIS — M25561 Pain in right knee: Secondary | ICD-10-CM

## 2021-02-08 DIAGNOSIS — G8929 Other chronic pain: Secondary | ICD-10-CM

## 2021-02-08 DIAGNOSIS — R5383 Other fatigue: Secondary | ICD-10-CM

## 2021-02-08 DIAGNOSIS — R7301 Impaired fasting glucose: Secondary | ICD-10-CM

## 2021-02-08 MED ORDER — OZEMPIC (0.25 OR 0.5 MG/DOSE) 2 MG/1.5ML ~~LOC~~ SOPN: 0.2500 mg | PEN_INJECTOR | SUBCUTANEOUS | 0 refills | Status: DC

## 2021-02-09 LAB — CBC WITH DIFFERENTIAL/PLATELET
Basophils Absolute: 0.1 10*3/uL (ref 0.0–0.2)
Basos: 1 %
EOS (ABSOLUTE): 0.2 10*3/uL (ref 0.0–0.4)
Eos: 3 %
Hematocrit: 45.4 % (ref 37.5–51.0)
Hemoglobin: 15.8 g/dL (ref 13.0–17.7)
Immature Grans (Abs): 0 10*3/uL (ref 0.0–0.1)
Immature Granulocytes: 0 %
Lymphocytes Absolute: 2.2 10*3/uL (ref 0.7–3.1)
Lymphs: 37 %
MCH: 32.5 pg (ref 26.6–33.0)
MCHC: 34.8 g/dL (ref 31.5–35.7)
MCV: 93 fL (ref 79–97)
Monocytes Absolute: 0.5 10*3/uL (ref 0.1–0.9)
Monocytes: 8 %
Neutrophils Absolute: 3 10*3/uL (ref 1.4–7.0)
Neutrophils: 51 %
Platelets: 137 10*3/uL — ABNORMAL LOW (ref 150–450)
RBC: 4.86 x10E6/uL (ref 4.14–5.80)
RDW: 13.2 % (ref 11.6–15.4)
WBC: 5.9 10*3/uL (ref 3.4–10.8)

## 2021-02-09 LAB — MAGNESIUM: Magnesium: 2 mg/dL (ref 1.6–2.3)

## 2021-02-09 LAB — COMPREHENSIVE METABOLIC PANEL
ALT: 73 IU/L — ABNORMAL HIGH (ref 0–44)
AST: 66 IU/L — ABNORMAL HIGH (ref 0–40)
Albumin/Globulin Ratio: 1.5 (ref 1.2–2.2)
Albumin: 4.5 g/dL (ref 3.8–4.9)
Alkaline Phosphatase: 110 IU/L (ref 44–121)
BUN/Creatinine Ratio: 15 (ref 9–20)
BUN: 14 mg/dL (ref 6–24)
Bilirubin Total: 1 mg/dL (ref 0.0–1.2)
CO2: 22 mmol/L (ref 20–29)
Calcium: 9.5 mg/dL (ref 8.7–10.2)
Chloride: 99 mmol/L (ref 96–106)
Creatinine, Ser: 0.95 mg/dL (ref 0.76–1.27)
Globulin, Total: 3 g/dL (ref 1.5–4.5)
Glucose: 107 mg/dL — ABNORMAL HIGH (ref 65–99)
Potassium: 3.9 mmol/L (ref 3.5–5.2)
Sodium: 140 mmol/L (ref 134–144)
Total Protein: 7.5 g/dL (ref 6.0–8.5)
eGFR: 92 mL/min/{1.73_m2} (ref >59)

## 2021-02-09 LAB — VITAMIN D 25 HYDROXY (VIT D DEFICIENCY, FRACTURES): Vit D, 25-Hydroxy: 46.3 ng/mL (ref 30.0–100.0)

## 2021-02-09 LAB — HEMOGLOBIN A1C
Est. average glucose Bld gHb Est-mCnc: 117 mg/dL
Hgb A1c MFr Bld: 5.7 % — ABNORMAL HIGH (ref 4.8–5.6)

## 2021-02-09 LAB — TSH: TSH: 2.46 u[IU]/mL (ref 0.450–4.500)

## 2021-02-09 LAB — INSULIN, RANDOM: INSULIN: 18.3 u[IU]/mL (ref 2.6–24.9)

## 2021-02-09 LAB — VITAMIN B12: Vitamin B-12: 498 pg/mL (ref 232–1245)

## 2021-02-09 LAB — T4, FREE: Free T4: 1.3 ng/dL (ref 0.82–1.77)

## 2021-02-11 DIAGNOSIS — E785 Hyperlipidemia, unspecified: Secondary | ICD-10-CM | POA: Diagnosis not present

## 2021-02-12 LAB — LIPID PANEL
Chol/HDL Ratio: 2.9 ratio (ref 0.0–5.0)
Cholesterol, Total: 171 mg/dL (ref 100–199)
HDL: 60 mg/dL (ref >39)
LDL Chol Calc (NIH): 93 mg/dL (ref 0–99)
Triglycerides: 102 mg/dL (ref 0–149)
VLDL Cholesterol Cal: 18 mg/dL (ref 5–40)

## 2021-02-14 ENCOUNTER — Telehealth: Payer: Self-pay

## 2021-02-14 DIAGNOSIS — E785 Hyperlipidemia, unspecified: Secondary | ICD-10-CM

## 2021-02-14 MED ORDER — REPATHA SURECLICK 140 MG/ML ~~LOC~~ SOAJ: 140.0000 mg | SUBCUTANEOUS | 11 refills | Status: DC

## 2021-02-14 NOTE — Telephone Encounter (Signed)
Called and spoke w/pt regarding results and he stated that yes he'll do repatha, rx sent, pa approved, copay card approved and emailed to pt, pt voiced understanding that he is to complete fasting labs post 4th dose

## 2021-02-14 NOTE — Progress Notes (Signed)
Dear Dr. Allyson Vance,   Thank you for referring Keith Vance to our clinic. The following note includes my evaluation and treatment recommendations.  Chief Complaint:   OBESITY Keith Vance (MR# [id number]) is a 60 y.o. male who presents for evaluation and treatment of obesity and related comorbidities. Current BMI is Body mass index is 40.76 kg/m. Keith Vance has been struggling with his weight for many years and has been unsuccessful in either losing weight, maintaining weight loss, or reaching his healthy weight goal.  Keith Vance is currently in the action stage of change and ready to dedicate time achieving and maintaining a healthier weight. Keith Vance is interested in becoming our patient and working on intensive lifestyle modifications including (but not limited to) diet and exercise for weight loss.  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  He lives with is wife and his father-in-law.  Denies polyphagia.  Eats regularly.  His highest weight was 315 pounds in December.  He stopped using the vending machine.  Gets 1100-1500 mg of sodium per day.  His hobbies are canning vegetables, raising chickens and rabbits and beekeeping.  He wants to get the weight off to help his knees and for blood management.  Keith Vance habits were reviewed today and are as follows: His family eats meals together, his desired weight loss is 36 pounds, he started gaining weight in his 30s, his heaviest weight ever was 315 pounds, and he is frequently drinking liquids with calories.  Render provided the following food recall today: Breakfast:  Up at 3:30 am and has oatmeal with walnuts/cranberries and will have another as a snack or will have lots of fruit (oranges)/boiled egg. Lunch:  10 am will have salad and more fruit. Dinner:  5-7 pm will have Chicken breast/thighs, rice, corn, green beans.  Depression Screen Keith Vance's Food and Mood (modified PHQ-9) score was 4.  Depression screen PHQ 2/9 02/08/2021  Decreased Interest  1  Down, Depressed, Hopeless 0  PHQ - 2 Score 1  Altered sleeping 1  Tired, decreased energy 1  Change in appetite 0  Feeling bad or failure about yourself  0  Trouble concentrating 1  Moving slowly or fidgety/restless 0  Suicidal thoughts 0  PHQ-9 Score 4  Difficult doing work/chores Not difficult at all   Assessment/Plan:   Orders Placed This Encounter  Procedures   CBC with Differential/Platelet   Comprehensive metabolic panel   Hemoglobin A1c   Insulin, random   VITAMIN D 25 Hydroxy (Vit-D Deficiency, Fractures)   Vitamin B12   TSH   T4, free   Magnesium   Lipid panel   Meds ordered this encounter  Medications   Semaglutide,0.25 or 0.5MG /DOS, (OZEMPIC, 0.25 OR 0.5 MG/DOSE,) 2 MG/1.5ML SOPN    Sig: Inject 0.25 mg into the skin once a week.    Dispense:  1.5 mL    Refill:  0    1. Other fatigue [name] denies daytime somnolence but denies waking up still tired. Patent has a history of symptoms of snoring. [name] generally gets 4 hours of sleep per night, and states that he has poor quality sleep. Snoring is present. Apneic episodes are present. Epworth Sleepiness Score is 5.  [name] does not feel that his weight is causing his energy to be lower than it should be. Fatigue may be related to obesity, depression or many other causes. Labs will be ordered, and in the meanwhile, [name] will focus on self care including making healthy food  choices, increasing physical activity and focusing on stress reduction.  2. SOB (shortness of breath) on exertion Keith Vance notes increasing shortness of breath with exercising and seems to be worsening over time with weight gain. He notes getting out of breath sooner with activity than he used to. This has not gotten worse recently. Keith Vance denies shortness of breath at rest or orthopnea.  [name]'s shortness of breath appears to be obesity related and exercise induced. He has agreed to work on weight loss and gradually increase exercise to treat his exercise induced  shortness of breath. Will continue to monitor closely.  3. Visceral obesity Current visceral fat rating: 25. Visceral fat rating should be < 13. Visceral adipose tissue is a hormonally active component of total body fat. This body composition phenotype is associated with medical disorders such as metabolic syndrome, cardiovascular disease and several malignancies including prostate, breast, and colorectal cancers. Starting goal: Lose 7-10% of starting weight.   4. Dyslipidemia, goal LDL below 70 Lipid-lowering medications: Lipitor 40 mg daily.   Plan: Dietary changes: Increase soluble fiber, decrease simple carbohydrates, decrease saturated fat. Exercise changes: Moderate to vigorous-intensity aerobic activity 150 minutes per week or as tolerated. We will continue to monitor along with PCP/specialists as it pertains to his weight loss journey.  Lab Results  Component Value Date   CHOL 171 02/11/2021   HDL 60 02/11/2021   LDLCALC 93 02/11/2021   TRIG 102 02/11/2021   CHOLHDL 2.9 02/11/2021   Lab Results  Component Value Date   ALT 73 (H) 02/08/2021   AST 66 (H) 02/08/2021   ALKPHOS 110 02/08/2021   BILITOT 1.0 02/08/2021   The 10-year ASCVD risk score Denman George(Goff DC Jr., et al., 2013) is: 9.2%   Values used to calculate the score:     Age: [age] years     Sex: Male     Is Non-Hispanic African American: No     Diabetic: No     Tobacco smoker: No     Systolic Blood Pressure: 151 mmHg     Is BP treated: Yes     HDL Cholesterol: 60 mg/dL     Total Cholesterol: 171 mg/dL  5. Essential hypertension Elevated today. Medications: losartan-HCTZ 100-12.5 mg daily, amlodipine 10 mg daily, carvedilol 12.5 mg twice daily.  With CAD, stable thoracic aortic aneurysm rupture.  Plan: Avoid buying foods that are: processed, frozen, or prepackaged to avoid excess salt.   BP Readings from Last 3 Encounters:  02/08/21 (!) 151/92  01/11/21 128/84  12/03/20 (!) 150/88   Lab Results  Component Value  Date   CREATININE 0.95 02/08/2021   6. Obstructive sleep apnea syndrome Keith Vance is using a CPAP machine.  OSA is a cause of systemic hypertension and is associated with an increased incidence of stroke, heart failure, atrial fibrillation, and coronary heart disease. Severe OSA increases all-cause mortality and cardiovascular mortality.   Goal: Treatment of OSA via CPAP compliance and weight loss. Plasma ghrelin levels (appetite or "hunger hormone") are significantly higher in OSA patients than in BMI-matched controls, but decrease to levels similar to those of obese patients without OSA after CPAP treatment.  Weight loss improves OSA by several mechanisms, including reduction in fatty tissue in the throat (i.e. parapharyngeal fat) and the tongue. Loss of abdominal fat increases mediastinal traction on the upper airway making it less likely to collapse during sleep. Studies have also shown that compliance with CPAP treatment improves leptin (hunger inhibitory hormone) imbalance.  7. Impaired fasting glucose Will  check A1c, fasting insulin today.  Will also start Ozempic 0.25 mg subcutaneously weekly. Risk versus benefits of medication reviewed. The patient understands monitoring parameters and red flags.   8. Vitamin D deficiency Optimal goal > 50 ng/dL.   Plan: Continue to take prescription Vitamin D @50 ,000 IU every week as prescribed.  Will check vitamin D level today.  - VITAMIN D 25 Hydroxy (Vit-D Deficiency, Fractures)  9. Hepatic steatosis With elevated ALT and AST.    Elevated liver transaminases with an ALT predominance combined with obesity and insulin resistance is characteristic, but not diagnostic of non-alcoholic fatty liver disease (NAFLD). NAFLD is the 2nd leading cause of liver transplant in adults. Treatment includes weight loss, elimination of sweet drinks, including juice, avoidance of high fructose corn syrup, and exercise. As always, avoiding alcohol consumption is  important.  Lab Results  Component Value Date   ALT 73 (H) 02/08/2021   AST 66 (H) 02/08/2021   ALKPHOS 110 02/08/2021   BILITOT 1.0 02/08/2021   10. Chronic pain of both knees Status post left knee replacement. We will continue to monitor symptoms as they relate to his weight loss journey.  11. Depression screening Keith Vance was screened for depression as part of his new patient workup today.  PHQ-9 is 4. Depression screening is negative.  12. At risk for nausea Keith Vance is at risk for nausea due to starting Ozempic. We reviewed ways to decrease side effect of nausea through eating small meals, avoiding high sugar or fat foods, and ultimately going back on the dose if not tolerating. Time > 8 minutes.  13. Class 3 severe obesity with serious comorbidity and body mass index (BMI) of 40.0 to 44.9 in adult, unspecified obesity type (HCC)  Keith Vance is currently in the action stage of change and his goal is to continue with weight loss efforts. I recommend Keith Vance begin the structured treatment plan as follows:  He has agreed to practicing portion control and making smarter food choices, such as increasing vegetables and decreasing simple carbohydrates.  Exercise goals:  As is.    Behavioral modification strategies: increasing lean protein intake, decreasing simple carbohydrates, increasing vegetables, increasing water intake, decreasing liquid calories, decreasing sodium intake, and increasing high fiber foods.  He was informed of the importance of frequent follow-up visits to maximize his success with intensive lifestyle modifications for his multiple health conditions. He was informed we would discuss his lab results at his next visit unless there is a critical issue that needs to be addressed sooner. Keith Vance agreed to keep his next visit at the agreed upon time to discuss these results.  Objective:   Blood pressure (!) 151/92, pulse (!) 59, temperature 98 F (36.7 C), temperature source Oral, height 5\' 9"   (1.753 m), weight 276 lb (125.2 kg), SpO2 94 %. Body mass index is 40.76 kg/m.  EKG: Perfomed on 12/03/2020.  Indirect Calorimeter completed today shows a VO2 of 232 and a REE of 1612.  His calculated basal metabolic rate is thus his basal metabolic rate is worse than expected.  General: Cooperative, alert, well developed, in no acute distress. HEENT: Conjunctivae and lids unremarkable. Cardiovascular: Regular rhythm.  Lungs: Normal work of breathing. Neurologic: No focal deficits.   Lab Results  Component Value Date   CREATININE 0.95 02/08/2021   BUN 14 02/08/2021   NA 140 02/08/2021   K 3.9 02/08/2021   CL 99 02/08/2021   CO2 22 02/08/2021   Lab Results  Component Value Date  ALT 73 (H) 02/08/2021   AST 66 (H) 02/08/2021   ALKPHOS 110 02/08/2021   BILITOT 1.0 02/08/2021   Lab Results  Component Value Date   HGBA1C 5.7 (H) 02/08/2021   HGBA1C 5.7 (H) 01/02/2020   HGBA1C 5.6 02/24/2019   HGBA1C 5.9 (H) 10/12/2018   HGBA1C 5.5 03/04/2018   Lab Results  Component Value Date   INSULIN 18.3 02/08/2021   Lab Results  Component Value Date   TSH 2.460 02/08/2021   Lab Results  Component Value Date   CHOL 171 02/11/2021   HDL 60 02/11/2021   LDLCALC 93 02/11/2021   TRIG 102 02/11/2021   CHOLHDL 2.9 02/11/2021   Lab Results  Component Value Date   WBC 5.9 02/08/2021   HGB 15.8 02/08/2021   HCT 45.4 02/08/2021   MCV 93 02/08/2021   PLT 137 (L) 02/08/2021   Attestation Statements:   This is the patient's first visit at [facility]. The patient's NEW PATIENT PACKET was reviewed at length. Included in the packet: current and past health history, medications, allergies, ROS, gynecologic history (women only), surgical history, family history, social history, weight history, weight loss surgery history (for those that have had weight loss surgery), nutritional evaluation, mood and food questionnaire, PHQ9, Epworth questionnaire, sleep habits  questionnaire, patient life and health improvement goals questionnaire. These will all be scanned into the patient's chart under media.   During the visit, I independently reviewed the patient's EKG, bioimpedance scale results, and indirect calorimeter results. I used this information to tailor a meal plan for the patient that will help him to lose weight and will improve his obesity-related conditions going forward. I performed a medically necessary appropriate examination and/or evaluation. I discussed the assessment and treatment plan with the patient. The patient was provided an opportunity to ask questions and all were answered. The patient agreed with the plan and demonstrated an understanding of the instructions. Labs were ordered at this visit and will be reviewed at the next visit unless more critical results need to be addressed immediately. Clinical information was updated and documented in the EMR.   I, Insurance claims handler, CMA, am acting as transcriptionist for Helane Rima, DO  I have reviewed the above documentation for accuracy and completeness, and I agree with the above. Helane Rima, DO

## 2021-02-14 NOTE — Telephone Encounter (Signed)
Lmom for lab results 

## 2021-02-14 NOTE — Addendum Note (Signed)
Addended by: Eather Colas on: 02/14/2021 03:27 PM   Modules accepted: Orders

## 2021-02-16 ENCOUNTER — Emergency Department (HOSPITAL_BASED_OUTPATIENT_CLINIC_OR_DEPARTMENT_OTHER): Payer: BC Managed Care – PPO

## 2021-02-16 ENCOUNTER — Other Ambulatory Visit: Payer: Self-pay

## 2021-02-16 ENCOUNTER — Encounter (HOSPITAL_BASED_OUTPATIENT_CLINIC_OR_DEPARTMENT_OTHER): Payer: Self-pay

## 2021-02-16 ENCOUNTER — Emergency Department (HOSPITAL_BASED_OUTPATIENT_CLINIC_OR_DEPARTMENT_OTHER)
Admission: EM | Admit: 2021-02-16 | Discharge: 2021-02-16 | Disposition: A | Discharge status: Home / Self Care | Payer: BC Managed Care – PPO | Attending: Emergency Medicine | Admitting: Emergency Medicine

## 2021-02-16 DIAGNOSIS — Z96652 Presence of left artificial knee joint: Secondary | ICD-10-CM | POA: Insufficient documentation

## 2021-02-16 DIAGNOSIS — I10 Essential (primary) hypertension: Secondary | ICD-10-CM | POA: Diagnosis not present

## 2021-02-16 DIAGNOSIS — Z7982 Long term (current) use of aspirin: Secondary | ICD-10-CM | POA: Insufficient documentation

## 2021-02-16 DIAGNOSIS — R112 Nausea with vomiting, unspecified: Secondary | ICD-10-CM

## 2021-02-16 DIAGNOSIS — K219 Gastro-esophageal reflux disease without esophagitis: Secondary | ICD-10-CM | POA: Insufficient documentation

## 2021-02-16 DIAGNOSIS — K529 Noninfective gastroenteritis and colitis, unspecified: Secondary | ICD-10-CM

## 2021-02-16 DIAGNOSIS — E860 Dehydration: Secondary | ICD-10-CM

## 2021-02-16 DIAGNOSIS — Z20822 Contact with and (suspected) exposure to covid-19: Secondary | ICD-10-CM | POA: Insufficient documentation

## 2021-02-16 DIAGNOSIS — N179 Acute kidney failure, unspecified: Secondary | ICD-10-CM

## 2021-02-16 DIAGNOSIS — Z79899 Other long term (current) drug therapy: Secondary | ICD-10-CM | POA: Diagnosis not present

## 2021-02-16 DIAGNOSIS — R1031 Right lower quadrant pain: Secondary | ICD-10-CM | POA: Diagnosis not present

## 2021-02-16 DIAGNOSIS — K760 Fatty (change of) liver, not elsewhere classified: Secondary | ICD-10-CM | POA: Diagnosis not present

## 2021-02-16 LAB — COMPREHENSIVE METABOLIC PANEL
ALT: 35 U/L (ref 0–44)
AST: 24 U/L (ref 15–41)
Albumin: 4.1 g/dL (ref 3.5–5.0)
Alkaline Phosphatase: 74 U/L (ref 38–126)
Anion gap: 13 (ref 5–15)
BUN: 22 mg/dL — ABNORMAL HIGH (ref 6–20)
CO2: 22 mmol/L (ref 22–32)
Calcium: 9 mg/dL (ref 8.9–10.3)
Chloride: 98 mmol/L (ref 98–111)
Creatinine, Ser: 1.73 mg/dL — ABNORMAL HIGH (ref 0.61–1.24)
GFR, Estimated: 45 mL/min — ABNORMAL LOW (ref >60)
Glucose, Bld: 133 mg/dL — ABNORMAL HIGH (ref 70–99)
Potassium: 3.3 mmol/L — ABNORMAL LOW (ref 3.5–5.1)
Sodium: 133 mmol/L — ABNORMAL LOW (ref 135–145)
Total Bilirubin: 1.1 mg/dL (ref 0.3–1.2)
Total Protein: 7.2 g/dL (ref 6.5–8.1)

## 2021-02-16 LAB — URINALYSIS, ROUTINE W REFLEX MICROSCOPIC
Bilirubin Urine: NEGATIVE
Glucose, UA: NEGATIVE mg/dL
Ketones, ur: NEGATIVE mg/dL
Leukocytes,Ua: NEGATIVE
Nitrite: NEGATIVE
Protein, ur: 30 mg/dL — AB
Specific Gravity, Urine: 1.018 (ref 1.005–1.030)
pH: 5.5 (ref 5.0–8.0)

## 2021-02-16 LAB — CBC WITH DIFFERENTIAL/PLATELET
Abs Immature Granulocytes: 0.04 10*3/uL (ref 0.00–0.07)
Basophils Absolute: 0 10*3/uL (ref 0.0–0.1)
Basophils Relative: 0 %
Eosinophils Absolute: 0.1 10*3/uL (ref 0.0–0.5)
Eosinophils Relative: 1 %
HCT: 42.8 % (ref 39.0–52.0)
Hemoglobin: 15.2 g/dL (ref 13.0–17.0)
Immature Granulocytes: 0 %
Lymphocytes Relative: 7 %
Lymphs Abs: 0.7 10*3/uL (ref 0.7–4.0)
MCH: 32.3 pg (ref 26.0–34.0)
MCHC: 35.5 g/dL (ref 30.0–36.0)
MCV: 90.9 fL (ref 80.0–100.0)
Monocytes Absolute: 0.9 10*3/uL (ref 0.1–1.0)
Monocytes Relative: 9 %
Neutro Abs: 7.7 10*3/uL (ref 1.7–7.7)
Neutrophils Relative %: 83 %
Platelets: 116 10*3/uL — ABNORMAL LOW (ref 150–400)
RBC: 4.71 MIL/uL (ref 4.22–5.81)
RDW: 13.2 % (ref 11.5–15.5)
WBC: 9.4 10*3/uL (ref 4.0–10.5)
nRBC: 0 % (ref 0.0–0.2)

## 2021-02-16 LAB — C DIFFICILE QUICK SCREEN W PCR REFLEX
C Diff antigen: NEGATIVE
C Diff interpretation: NOT DETECTED
C Diff toxin: NEGATIVE

## 2021-02-16 LAB — LIPASE, BLOOD: Lipase: 21 U/L (ref 11–51)

## 2021-02-16 LAB — LACTIC ACID, PLASMA
Lactic Acid, Venous: 0.9 mmol/L (ref 0.5–1.9)
Lactic Acid, Venous: 1 mmol/L (ref 0.5–1.9)

## 2021-02-16 LAB — AMMONIA: Ammonia: 40 umol/L — ABNORMAL HIGH (ref 9–35)

## 2021-02-16 LAB — PROTIME-INR
INR: 1.1 (ref 0.8–1.2)
Prothrombin Time: 14.4 seconds (ref 11.4–15.2)

## 2021-02-16 LAB — RESP PANEL BY RT-PCR (FLU A&B, COVID) ARPGX2
Influenza A by PCR: NEGATIVE
Influenza B by PCR: NEGATIVE
SARS Coronavirus 2 by RT PCR: NEGATIVE

## 2021-02-16 MED ORDER — SODIUM CHLORIDE 0.9 % IV BOLUS
1000.0000 mL | Freq: Once | INTRAVENOUS | Status: AC
  Administered 2021-02-16: 1000 mL via INTRAVENOUS

## 2021-02-16 MED ORDER — PROMETHAZINE HCL 25 MG PO TABS: 25.0000 mg | ORAL_TABLET | Freq: Four times a day (QID) | ORAL | 0 refills | Status: DC | PRN

## 2021-02-16 MED ORDER — IOHEXOL 300 MG/ML  SOLN
80.0000 mL | Freq: Once | INTRAMUSCULAR | Status: AC | PRN
  Administered 2021-02-16: 77 mL via INTRAVENOUS

## 2021-02-16 MED ORDER — MORPHINE SULFATE (PF) 4 MG/ML IV SOLN
4.0000 mg | Freq: Once | INTRAVENOUS | Status: AC
  Administered 2021-02-16: 4 mg via INTRAVENOUS
  Filled 2021-02-16: qty 1

## 2021-02-16 MED ORDER — AMOXICILLIN-POT CLAVULANATE 875-125 MG PO TABS: 1.0000 | ORAL_TABLET | Freq: Two times a day (BID) | ORAL | 0 refills | Status: AC

## 2021-02-16 MED ORDER — OXYCODONE HCL 5 MG PO TABS: 5.0000 mg | ORAL_TABLET | ORAL | 0 refills | Status: DC | PRN

## 2021-02-16 MED ORDER — SODIUM CHLORIDE 0.9 % IV SOLN
25.0000 mg | Freq: Four times a day (QID) | INTRAVENOUS | Status: DC | PRN
  Filled 2021-02-16: qty 1

## 2021-02-16 MED ORDER — PROMETHAZINE HCL 25 MG/ML IJ SOLN
INTRAMUSCULAR | Status: AC
  Administered 2021-02-16: 25 mg
  Filled 2021-02-16: qty 1

## 2021-02-16 NOTE — ED Provider Notes (Signed)
[facility] EMERGENCY DEPT Provider Note   CSN: [id number] Arrival date & time: 02/16/21  4098     History Chief Complaint  Patient presents with   Abdominal Pain    Keith Vance is a 60 y.o. male.  The history is provided by the patient, medical records and the spouse. No language interpreter was used.  Abdominal Pain Pain location:  RLQ, LLQ and suprapubic Pain quality: aching and dull   Pain radiates to:  Does not radiate Pain severity:  Severe Onset quality:  Gradual Duration:  3 days Timing:  Constant Progression:  Waxing and waning Chronicity:  New Context: previous surgery   Relieved by:  Nothing Worsened by:  Nothing Ineffective treatments:  None tried Associated symptoms: chills, diarrhea, fatigue, fever, nausea and vomiting   Associated symptoms: no chest pain, no constipation, no cough, no dysuria, no melena and no shortness of breath       Past Medical History:  Diagnosis Date   Arthritis    Ascending aortic aneurysm (HCC)    a. 10/2015 CTA chest: 4.4cm aneurysmal dil of Asc Ao-->rec f/u in 1 yr.   Back pain    Chest pain 10/11/2018   Fatty liver    GERD (gastroesophageal reflux disease)    hx   Heart disease    History of kidney stones    Hyperlipidemia    Hypertension    Joint pain    Liver disease    Lower extremity edema    Obesity    Pneumonia    hx   Prediabetes    Rheumatoid arthritis (HCC)    SBO (small bowel obstruction) (HCC)    a. 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   Sleep apnea    cpap    Patient Active Problem List   Diagnosis Date Noted   Elevated coronary artery calcium score 12/03/2020   Elevated serum creatinine 02/24/2019   Pre-diabetes 02/24/2019   Vitamin D deficiency 02/24/2019   Normal coronary arteries 01/02/2019   NAFLD (nonalcoholic fatty liver disease) 11/91/4782   Dyslipidemia, goal LDL below 70 03/18/2018   Morbid obesity (HCC) 02/27/2018   h/o elevated LFT's- dx of Fatty liver disease, nonalcoholic  02/27/2018   Primary osteoarthritis of left knee 07/30/2017   Failed total knee arthroplasty, initial encounter (HCC) 07/27/2017   S/P left TKA 04/24/2016   S/P knee replacement 04/24/2016   Thoracic aortic aneurysm (HCC) 01/07/2016   Midsternal chest pain 11/14/2015   Essential hypertension 11/14/2015   Chest pain 11/11/2015   History of small bowel obstruction 02/27/2015   Leukocytosis 02/27/2015   Elevated LFTs 03/07/2012   GI problem 03/07/2012   Sleep apnea 03/07/2012    Past Surgical History:  Procedure Laterality Date   HERNIA REPAIR Left    inguinal, and umbilical   JOINT REPLACEMENT     REVISION TOTAL KNEE ARTHROPLASTY Left 07/30/2017   LEFT TOTAL KNEE PARTIAL REVISION AND SYNOVECTOMY/notes 07/30/2017   TOTAL KNEE ARTHROPLASTY Left 04/24/2016   Procedure: LEFT TOTAL KNEE ARTHROPLASTY;  Surgeon: Durene Romans, MD;  Location: [facility];  Service: Orthopedics;  Laterality: Left;   TOTAL KNEE REVISION Left 07/30/2017   Procedure: LEFT TOTAL KNEE PARTIAL REVISION AND SYNOVECTOMY;  Surgeon: Gean Birchwood, MD;  Location: [facility] OR;  Service: Orthopedics;  Laterality: Left;       Family History  Problem Relation Age of Onset   Cancer Mother    Hypertension Mother    Hyperlipidemia Mother    Stroke Mother    Heart disease Mother  Depression Mother    Hypertension Father    Diabetes Father    Cancer Father        bone   Hyperlipidemia Father    Heart attack Maternal Grandmother        >87s of age    Social History   Tobacco Use   Smoking status: Never   Smokeless tobacco: Never   Tobacco comments:    off and on as a teenager - very little per patient   Vaping Use   Vaping Use: Never used  Substance Use Topics   Alcohol use: No   Drug use: No    Home Medications Prior to Admission medications   Medication Sig Start Date End Date Taking? Authorizing Provider  amLODipine (NORVASC) 10 MG tablet Take 1 tablet (10 mg total) by mouth daily. 11/15/20   Runell Gess,  MD  aspirin EC 81 MG EC tablet Take 1 tablet (81 mg total) by mouth daily. 10/13/18   Barnetta Chapel, MD  atorvastatin (LIPITOR) 40 MG tablet Take 1 tablet (40 mg total) by mouth at bedtime. 12/16/20   Runell Gess, MD  carvedilol (COREG) 12.5 MG tablet Take 1 tablet (12.5 mg total) by mouth 2 (two) times daily with a meal. 10/20/20   Abonza, Maritza, PA-C  Docusate Calcium (STOOL SOFTENER PO) Take by mouth.    [provider]  Evolocumab (REPATHA SURECLICK) 140 MG/ML SOAJ Inject 140 mg into the skin every 14 (fourteen) days. 02/14/21   Runell Gess, MD  losartan-hydrochlorothiazide [facility]) 100-12.5 MG tablet TAKE 1 TABLET BY MOUTH DAILY 01/25/21   Mayer Masker, PA-C  Multiple Vitamins-Minerals (MULTIVITAMIN WITH MINERALS) tablet Take 1 tablet by mouth daily.    [provider]  Omega-3 Fatty Acids (FISH OIL) 1200 MG CAPS Take 1,200 mg by mouth daily.    [provider]  Semaglutide,0.25 or 0.5MG /DOS, (OZEMPIC, 0.25 OR 0.5 MG/DOSE,) 2 MG/1.5ML SOPN Inject 0.25 mg into the skin once a week. 02/08/21   Helane Rima, DO  Vitamin D, Ergocalciferol, (DRISDOL) 1.25 MG (50000 UNIT) CAPS capsule TAKE 1 CAPSULE BY MOUTH WEEKLY **PLEASE SCHEDULE A FOLLOW UP APPT FOR FUTURE MED REFILLS** 01/20/21   Mayer Masker, PA-C    Allergies    Patient has no known allergies.  Review of Systems   Review of Systems  Constitutional:  Positive for chills, diaphoresis, fatigue and fever.  HENT:  Negative for congestion.   Eyes:  Negative for visual disturbance.  Respiratory:  Negative for cough, chest tightness, shortness of breath, wheezing and stridor.   Cardiovascular:  Negative for chest pain, palpitations and leg swelling.  Gastrointestinal:  Positive for abdominal pain, diarrhea, nausea and vomiting. Negative for abdominal distention, constipation and melena.  Genitourinary:  Positive for decreased urine volume. Negative for dysuria, flank pain and frequency.   Musculoskeletal:  Negative for back pain and myalgias.  Neurological:  Negative for light-headedness and headaches.  Psychiatric/Behavioral:  Negative for agitation and confusion.   All other systems reviewed and are negative.  Physical Exam Updated Vital Signs BP 125/77   Pulse 98   Temp 100.3 F (37.9 C) (Oral)   Resp (!) 22   SpO2 96%   Physical Exam Vitals and nursing note reviewed.  Constitutional:      General: He is not in acute distress.    Appearance: He is well-developed. He is ill-appearing. He is not toxic-appearing or diaphoretic.  HENT:     Head: Normocephalic and atraumatic.  Eyes:  Extraocular Movements: Extraocular movements intact.     Conjunctiva/sclera: Conjunctivae normal.     Pupils: Pupils are equal, round, and reactive to light.  Cardiovascular:     Rate and Rhythm: Normal rate and regular rhythm.     Heart sounds: No murmur heard. Pulmonary:     Effort: Pulmonary effort is normal. No respiratory distress.     Breath sounds: Normal breath sounds. No wheezing, rhonchi or rales.  Chest:     Chest wall: No tenderness.  Abdominal:     General: Abdomen is flat. Bowel sounds are normal. There is no distension.     Palpations: Abdomen is soft.     Tenderness: There is abdominal tenderness in the right lower quadrant, suprapubic area and left lower quadrant. There is no right CVA tenderness, left CVA tenderness, guarding or rebound.  Musculoskeletal:        General: No tenderness.     Cervical back: Neck supple.     Right lower leg: No edema.     Left lower leg: No edema.  Skin:    General: Skin is warm and dry.     Capillary Refill: Capillary refill takes less than 2 seconds.     Findings: No erythema.  Neurological:     General: No focal deficit present.     Mental Status: He is alert.  Psychiatric:        Mood and Affect: Mood normal.    ED Results / Procedures / Treatments   Labs (all labs ordered are listed, but only abnormal results  are displayed) Labs Reviewed  CBC WITH DIFFERENTIAL/PLATELET - Abnormal; Notable for the following components:      Result Value   Platelets 116 (*)    All other components within normal limits  COMPREHENSIVE METABOLIC PANEL - Abnormal; Notable for the following components:   Sodium 133 (*)    Potassium 3.3 (*)    Glucose, Bld 133 (*)    BUN 22 (*)    Creatinine, Ser 1.73 (*)    GFR, Estimated 45 (*)    All other components within normal limits  URINALYSIS, ROUTINE W REFLEX MICROSCOPIC - Abnormal; Notable for the following components:   Hgb urine dipstick SMALL (*)    Protein, ur 30 (*)    Bacteria, UA RARE (*)    All other components within normal limits  AMMONIA - Abnormal; Notable for the following components:   Ammonia 40 (*)    All other components within normal limits  RESP PANEL BY RT-PCR (FLU A&B, COVID) ARPGX2  C DIFFICILE QUICK SCREEN W PCR REFLEX    URINE CULTURE  CULTURE, BLOOD (ROUTINE X 2)  CULTURE, BLOOD (ROUTINE X 2)  GASTROINTESTINAL PANEL BY PCR, STOOL (REPLACES STOOL CULTURE)  LACTIC ACID, PLASMA  LACTIC ACID, PLASMA  LIPASE, BLOOD  PROTIME-INR    EKG None  Radiology No results found.  Procedures Procedures   Medications Ordered in ED Medications  promethazine (PHENERGAN) 25 mg in sodium chloride 0.9 % 50 mL IVPB (has no administration in time range)  sodium chloride 0.9 % bolus 1,000 mL (0 mLs Intravenous Stopped 02/16/21 1202)  morphine 4 MG/ML injection 4 mg (4 mg Intravenous Given 02/16/21 1503)  iohexol (OMNIPAQUE) 300 MG/ML solution 80 mL (77 mLs Intravenous Contrast Given 02/16/21 1109)  promethazine (PHENERGAN) 25 MG/ML injection (25 mg  Given 02/16/21 1503)    ED Course  I have reviewed the triage vital signs and the nursing notes.  Pertinent labs & imaging results  that were available during my care of the patient were reviewed by me and considered in my medical decision making (see chart for details).    MDM Rules/Calculators/A&P                           MALOSI HEMSTREET is a 60 y.o. male with past medical history significant for hypertension, hyperlipidemia, GERD, liver disease, rheumatoid arthritis, aortic aneurysm, prior kidney stones, and prior small bowel obstruction who presents with nausea, vomiting, diarrhea, fevers, chills, fatigue, and abdominal pain.  According to patient, since Sunday, he has had the symptoms for the last 4 days.  He reports he has had nearly constant diarrhea up to 3 times an hour that is just watery.  He does report some vomiting as well and today had some small amount of streaky blood.  He reports fevers up to 104 and has been trying to take Tylenol.  He reports the nausea and vomiting is been persistent.  He does report he ate suspicious fruit from [place] on Sunday, Monday, and Tuesday to try to eat something and his symptoms of been persistent.  Nobody else ate the fruit and he is concerned about a GI bug now.  He reports no trauma.  He denies any congestion or cough, chest pain, shortness of breath.  No palpitations reported.  He reports that his pain is 7 out of 10 across his lower abdomen but is not complaining of pain in the upper abdomen or mid abdomen.  He reports does not feel anything like his bowel obstruction in the past and he reports his urine has been darker and less at times.  He reports that his aneurysm is closely monitored by his cardiology team and he is not concerned about the aorta at this time.  Denies any leg symptoms now.  Pain does not radiate to his back or his flanks.  On exam, abdomen is tender in the lower abdomen diffusely.  Bowel sounds appreciated.  Lungs clear and chest is nontender.  Flanks and back nontender.  No focal neurologic deficits.  Good pulses in extremities.  Legs nontender and not different and edema compared to baseline he reports.  Intact pulses.  Skin appears dry.  Clinically I do suspect patient is dehydrated from all the nausea, vomiting, diarrhea.  I  suspect this is a gastroenteritis type source however patient does appear to be on some medications for his rheumatoid arthritis making him possibly partially renal compromise.  We will get screening labs including a GI pathogen panel and C. difficile panel.  We will check his urine with his decreased urine and lower abdominal discomfort.  We will get a CT scan to rule out diverticulitis, bowel obstruction, colitis, or infected stone.  Given his lack of leg symptoms, pain not going to his back, and report that his aneurysm is well monitored, low suspicion for an aneurysmal cause of symptoms.  Will check for COVID and influenza.  We will give some fluids, pain medicine, nausea medicine.  Anticipate reassessment after work-up to determine disposition.  Patient's work-up shows pancolitis.  Laboratory testing shows dehydration and acute kidney injury.  Patient was feeling better after medications.  We offered admission given the sigmoid colitis, continued nausea, vomiting, diarrhea, and the colitis on imaging however patient would like to try going home.  Vital signs had improved and he was feeling much better.  He was able to tolerate p.o. including food and fluids.  He will be given printed for pain medicine, nausea medicine, antibiotics.  His C. difficile test was negative as was his COVID and flu test.  A GI pathogen panel was sent and he will follow-up with his PCP for these results.  Patient understands extremely strict return precautions for any new or worsening symptoms including intolerance of p.o. or worsened discomfort.  He had no other questions or concerns and was discharged in good condition.  Final Clinical Impression(s) / ED Diagnoses Final diagnoses:  Nausea vomiting and diarrhea  AKI (acute kidney injury) (HCC)  Dehydration  Colitis    Rx / DC Orders ED Discharge Orders          Ordered    amoxicillin-clavulanate (AUGMENTIN) 875-125 MG tablet  2 times daily        02/16/21 1538     promethazine (PHENERGAN) 25 MG tablet  Every 6 hours PRN        02/16/21 1538    oxyCODONE (ROXICODONE) 5 MG immediate release tablet  Every 4 hours PRN        02/16/21 1538            Clinical Impression: 1. Nausea vomiting and diarrhea   2. AKI (acute kidney injury) (HCC)   3. Dehydration   4. Colitis     Disposition: Discharge  Condition: Good  I have discussed the results, Dx and Tx plan with the pt(& family if present). He/she/they expressed understanding and agree(s) with the plan. Discharge instructions discussed at great length. Strict return precautions discussed and pt &/or family have verbalized understanding of the instructions. No further questions at time of discharge.    New Prescriptions   AMOXICILLIN-CLAVULANATE (AUGMENTIN) 875-125 MG TABLET    Take 1 tablet by mouth 2 (two) times daily for 10 days.   OXYCODONE (ROXICODONE) 5 MG IMMEDIATE RELEASE TABLET    Take 1 tablet (5 mg total) by mouth every 4 (four) hours as needed for severe pain.   PROMETHAZINE (PHENERGAN) 25 MG TABLET    Take 1 tablet (25 mg total) by mouth every 6 (six) hours as needed for nausea or vomiting.    Follow Up: Mayer Masker, PA-C [address] [place] [address] [phone]     [facility] [address] [place] [address] [phone]       Mychal Durio, Canary Brim, MD 02/16/21 [phone]

## 2021-02-16 NOTE — Discharge Instructions (Addendum)
Your history, exam, work-up today are consistent with dehydration and acute kidney injury related to all the nausea, vomiting, diarrhea which is likely due to the colitis we discovered on imaging.  I am unsure if this is related to the fruit you described eating over the last few days however a stool pathogen panel was sent and will likely return in the next day or 2.  The C. difficile test was negative.  Please take the pain medicine, nausea medicine, and maintain hydration and take the new antibiotics for the next 10 days.  Please rest and follow-up with your primary doctor for repeat lab testing.  If any symptoms change or worsen or you are unable to tolerate your medications, please turn to the nearest emergency department for further evaluation.

## 2021-02-16 NOTE — ED Triage Notes (Signed)
He c/o low abd. Pain plus several diarrhea stools per day, plus waxing and waning fevers since this Sunday. He is ambulatory and in no distress.

## 2021-02-16 NOTE — ED Notes (Signed)
Patient transported to CT 

## 2021-02-17 LAB — URINE CULTURE: Culture: <10000 — AB

## 2021-02-21 LAB — CULTURE, BLOOD (ROUTINE X 2)
Culture: NO GROWTH
Culture: NO GROWTH
Special Requests: ADEQUATE
Special Requests: ADEQUATE

## 2021-02-22 ENCOUNTER — Other Ambulatory Visit: Payer: Self-pay

## 2021-02-22 ENCOUNTER — Ambulatory Visit (INDEPENDENT_AMBULATORY_CARE_PROVIDER_SITE_OTHER): Payer: BC Managed Care – PPO | Admitting: Family Medicine

## 2021-02-22 ENCOUNTER — Encounter (INDEPENDENT_AMBULATORY_CARE_PROVIDER_SITE_OTHER): Payer: Self-pay | Admitting: Family Medicine

## 2021-02-22 VITALS — BP 123/78 | HR 52 | Temp 98.1°F | Ht 69.0 in | Wt 276.0 lb

## 2021-02-22 DIAGNOSIS — K529 Noninfective gastroenteritis and colitis, unspecified: Secondary | ICD-10-CM | POA: Diagnosis not present

## 2021-02-22 DIAGNOSIS — Z9189 Other specified personal risk factors, not elsewhere classified: Secondary | ICD-10-CM

## 2021-02-22 DIAGNOSIS — E559 Vitamin D deficiency, unspecified: Secondary | ICD-10-CM | POA: Diagnosis not present

## 2021-02-22 DIAGNOSIS — E7849 Other hyperlipidemia: Secondary | ICD-10-CM

## 2021-02-22 DIAGNOSIS — Z6841 Body Mass Index (BMI) 40.0 and over, adult: Secondary | ICD-10-CM

## 2021-02-22 DIAGNOSIS — I10 Essential (primary) hypertension: Secondary | ICD-10-CM

## 2021-02-22 NOTE — Progress Notes (Signed)
Chief Complaint:   OBESITY Keith Vance is here to discuss his progress with his obesity treatment plan along with follow-up of his obesity related diagnoses. See Medical Weight Management Flowsheet for complete bioelectrical impedance results.  Today's visit was #: 2 Starting weight: 276 lbs Starting date: 02/08/2021 Today's weight: 276 lbs Today's date: 02/22/2021 Weight change since last visit: 0 Total lbs lost to date: 0 Body mass index is 40.76 kg/m.   Interim History: [name] recently had colitis.  I reviewed his ED visit.  He has a follow-up visit scheduled with his PCP tomorrow. Nutrition Plan: practicing portion control and making smarter food choices, such as increasing vegetables and decreasing simple carbohydrates for 100% of the time. Activity:  Increased activity.  Assessment/Plan:   1. Colitis Concern for IBD.  Recommend follow-up with GI.  Due for next colonoscopy.  Bland diet.  Consider low FODMAP.  Continue antibiotics.   2. Essential hypertension At goal. Medications: Norvasc 10 mg daily, Coreg 12.5 mg twice daily, Hyzaar 100-12.5 mg daily.   Plan: Avoid buying foods that are: processed, frozen, or prepackaged to avoid excess salt. We will watch for signs of hypotension as he continues lifestyle modifications.  BP Readings from Last 3 Encounters:  02/23/21 134/83  02/22/21 123/78  02/16/21 (!) 143/77   Lab Results  Component Value Date   CREATININE 1.73 (H) 02/16/2021   3. Other hyperlipidemia Course: At goal. Lipid-lowering medications: Lipitor 40 mg daily, Repatha, and fish oil.   Plan: Dietary changes: Increase soluble fiber, decrease simple carbohydrates, decrease saturated fat. Exercise changes: Moderate to vigorous-intensity aerobic activity 150 minutes per week or as tolerated. We will continue to monitor along with PCP/specialists as it pertains to his weight loss journey.  Lab Results  Component Value Date   CHOL 171 02/11/2021   HDL 60 02/11/2021    LDLCALC 93 02/11/2021   TRIG 102 02/11/2021   CHOLHDL 2.9 02/11/2021   Lab Results  Component Value Date   ALT 35 02/16/2021   AST 24 02/16/2021   ALKPHOS 74 02/16/2021   BILITOT 1.1 02/16/2021   The 10-year ASCVD risk score Denman George DC Jr., et al., 2013) is: 7.5%   Values used to calculate the score:     Age: 60 years     Sex: Male     Is Non-Hispanic African American: No     Diabetic: No     Tobacco smoker: No     Systolic Blood Pressure: 134 mmHg     Is BP treated: Yes     HDL Cholesterol: 60 mg/dL     Total Cholesterol: 171 mg/dL  4. Vitamin D deficiency Improving, but not optimized.  He is taking vitamin D 50,000 IU weekly.  Plan: Continue to take prescription Vitamin D @50 ,000 IU every week as prescribed.  Follow-up for routine testing of Vitamin D, at least 2-3 times per year to avoid over-replacement.  Lab Results  Component Value Date   VD25OH 46.3 02/08/2021   VD25OH 29.5 (L) 01/02/2020   VD25OH 24.8 (L) 02/24/2019   5. At risk for malnutrition [name] was given extensive malnutrition prevention education and counseling today of more than 8 minutes.  Counseled him that malnutrition refers to inappropriate nutrients or not the right balance of nutrients for optimal health.  Discussed with Channing Mutters that it is absolutely possible to be malnourished but yet obese.    6. Obesity, current BMI 40.8  Course: [name] is currently in the action stage of  change. As such, his goal is to continue with weight loss efforts.   Nutrition goals: He has agreed to practicing portion control and making smarter food choices, such as increasing vegetables and decreasing simple carbohydrates.   Exercise goals:  As is.  Behavioral modification strategies:  Continue bland diet, increase liquids (bone health).  Ren has agreed to follow-up with our clinic in 2-3 weeks. He was informed of the importance of frequent follow-up visits to maximize his success with intensive lifestyle modifications  for his multiple health conditions.   Objective:   Blood pressure 123/78, pulse (!) 52, temperature 98.1 F (36.7 C), temperature source Oral, height 5\' 9"  (1.753 m), weight 276 lb (125.2 kg), SpO2 96 %. Body mass index is 40.76 kg/m.  General: Cooperative, alert, well developed, in no acute distress. HEENT: Conjunctivae and lids unremarkable. Cardiovascular: Regular rhythm.  Lungs: Normal work of breathing. Neurologic: No focal deficits.   Lab Results  Component Value Date   CREATININE 1.73 (H) 02/16/2021   BUN 22 (H) 02/16/2021   NA 133 (L) 02/16/2021   K 3.3 (L) 02/16/2021   CL 98 02/16/2021   CO2 22 02/16/2021   Lab Results  Component Value Date   ALT 35 02/16/2021   AST 24 02/16/2021   ALKPHOS 74 02/16/2021   BILITOT 1.1 02/16/2021   Lab Results  Component Value Date   HGBA1C 5.7 (H) 02/08/2021   HGBA1C 5.7 (H) 01/02/2020   HGBA1C 5.6 02/24/2019   HGBA1C 5.9 (H) 10/12/2018   HGBA1C 5.5 03/04/2018   Lab Results  Component Value Date   INSULIN 18.3 02/08/2021   Lab Results  Component Value Date   TSH 2.460 02/08/2021   Lab Results  Component Value Date   CHOL 171 02/11/2021   HDL 60 02/11/2021   LDLCALC 93 02/11/2021   TRIG 102 02/11/2021   CHOLHDL 2.9 02/11/2021   Lab Results  Component Value Date   WBC 9.4 02/16/2021   HGB 15.2 02/16/2021   HCT 42.8 02/16/2021   MCV 90.9 02/16/2021   PLT 116 (L) 02/16/2021   Attestation Statements:   Reviewed by clinician on day of visit: allergies, medications, problem list, medical history, surgical history, family history, social history, and previous encounter notes.  I, 02/18/2021, CMA, am acting as transcriptionist for Insurance claims handler, DO  I have reviewed the above documentation for accuracy and completeness, and I agree with the above. Helane Rima, DO

## 2021-02-23 ENCOUNTER — Ambulatory Visit (INDEPENDENT_AMBULATORY_CARE_PROVIDER_SITE_OTHER): Payer: BC Managed Care – PPO | Admitting: Physician Assistant

## 2021-02-23 ENCOUNTER — Encounter: Payer: Self-pay | Admitting: Physician Assistant

## 2021-02-23 VITALS — BP 134/83 | HR 62 | Temp 98.3°F | Ht 69.0 in | Wt 278.8 lb

## 2021-02-23 DIAGNOSIS — I10 Essential (primary) hypertension: Secondary | ICD-10-CM | POA: Diagnosis not present

## 2021-02-23 DIAGNOSIS — K529 Noninfective gastroenteritis and colitis, unspecified: Secondary | ICD-10-CM | POA: Diagnosis not present

## 2021-02-23 DIAGNOSIS — E785 Hyperlipidemia, unspecified: Secondary | ICD-10-CM | POA: Diagnosis not present

## 2021-02-23 DIAGNOSIS — G4733 Obstructive sleep apnea (adult) (pediatric): Secondary | ICD-10-CM

## 2021-02-23 NOTE — Patient Instructions (Signed)
Heart-Healthy Eating Plan Many factors influence your heart (coronary) health, including eating and exercise habits. Coronary risk increases with abnormal blood fat (lipid) levels. Heart-healthy meal planning includes limiting unhealthy fats,increasing healthy fats, and making other diet and lifestyle changes. What is my plan? Your health care provider may recommend that you: Limit your fat intake to _________% or less of your total calories each day. Limit your saturated fat intake to _________% or less of your total calories each day. Limit the amount of cholesterol in your diet to less than _________ mg per day. What are tips for following this plan? Cooking Cook foods using methods other than frying. Baking, boiling, grilling, and broiling are all good options. Other ways to reduce fat include: Removing the skin from poultry. Removing all visible fats from meats. Steaming vegetables in water or broth. Meal planning  At meals, imagine dividing your plate into fourths: Fill one-half of your plate with vegetables and green salads. Fill one-fourth of your plate with whole grains. Fill one-fourth of your plate with lean protein foods. Eat 4-5 servings of vegetables per day. One serving equals 1 cup raw or cooked vegetable, or 2 cups raw leafy greens. Eat 4-5 servings of fruit per day. One serving equals 1 medium whole fruit,  cup dried fruit,  cup fresh, frozen, or canned fruit, or  cup 100% fruit juice. Eat more foods that contain soluble fiber. Examples include apples, broccoli, carrots, beans, peas, and barley. Aim to get 25-30 g of fiber per day. Increase your consumption of legumes, nuts, and seeds to 4-5 servings per week. One serving of dried beans or legumes equals  cup cooked, 1 serving of nuts is  cup, and 1 serving of seeds equals 1 tablespoon.  Fats Choose healthy fats more often. Choose monounsaturated and polyunsaturated fats, such as olive and canola oils, flaxseeds,  walnuts, almonds, and seeds. Eat more omega-3 fats. Choose salmon, mackerel, sardines, tuna, flaxseed oil, and ground flaxseeds. Aim to eat fish at least 2 times each week. Check food labels carefully to identify foods with trans fats or high amounts of saturated fat. Limit saturated fats. These are found in animal products, such as meats, butter, and cream. Plant sources of saturated fats include palm oil, palm kernel oil, and coconut oil. Avoid foods with partially hydrogenated oils in them. These contain trans fats. Examples are stick margarine, some tub margarines, cookies, crackers, and other baked goods. Avoid fried foods. General information Eat more home-cooked food and less restaurant, buffet, and fast food. Limit or avoid alcohol. Limit foods that are high in starch and sugar. Lose weight if you are overweight. Losing just 5-10% of your body weight can help your overall health and prevent diseases such as diabetes and heart disease. Monitor your salt (sodium) intake, especially if you have high blood pressure. Talk with your health care provider about your sodium intake. Try to incorporate more vegetarian meals weekly. What foods can I eat? Fruits All fresh, canned (in natural juice), or frozen fruits. Vegetables Fresh or frozen vegetables (raw, steamed, roasted, or grilled). Green salads. Grains Most grains. Choose whole wheat and whole grains most of the time. Rice andpasta, including brown rice and pastas made with whole wheat. Meats and other proteins Lean, well-trimmed beef, veal, pork, and lamb. Chicken and turkey without skin. All fish and shellfish. Wild duck, rabbit, pheasant, and venison. Egg whites or low-cholesterol egg substitutes. Dried beans, peas, lentils, and tofu. Seedsand most nuts. Dairy Low-fat or nonfat cheeses, including ricotta   and mozzarella. Skim or 1% milk (liquid, powdered, or evaporated). Buttermilk made with low-fat milk. Nonfat orlow-fat yogurt. Fats  and oils Non-hydrogenated (trans-free) margarines. Vegetable oils, including soybean, sesame, sunflower, olive, peanut, safflower, corn, canola, and cottonseed. Salad dressings or mayonnaisemade with a vegetable oil. Beverages Water (mineral or sparkling). Coffee and tea. Diet carbonated beverages. Sweets and desserts Sherbet, gelatin, and fruit ice. Small amounts of dark chocolate. Limit all sweets and desserts. Seasonings and condiments All seasonings and condiments. The items listed above may not be a complete list of foods and beverages you can eat. Contact a dietitian for more options. What foods are not recommended? Fruits Canned fruit in heavy syrup. Fruit in cream or butter sauce. Fried fruit. Limitcoconut. Vegetables Vegetables cooked in cheese, cream, or butter sauce. Fried vegetables. Grains Breads made with saturated or trans fats, oils, or whole milk. Croissants. Sweet rolls. Donuts. High-fat crackers,such as cheese crackers. Meats and other proteins Fatty meats, such as hot dogs, ribs, sausage, bacon, rib-eye roast or steak. High-fat deli meats, such as salami and bologna. Caviar. Domestic duck andgoose. Organ meats, such as liver. Dairy Cream, sour cream, cream cheese, and creamed cottage cheese. Whole milk cheeses. Whole or 2% milk (liquid, evaporated, or condensed). Whole buttermilk.Cream sauce or high-fat cheese sauce. Whole-milk yogurt. Fats and oils Meat fat, or shortening. Cocoa butter, hydrogenated oils, palm oil, coconut oil, palm kernel oil. Solid fats and shortenings, including bacon fat, salt pork, lard, and butter. Nondairy cream substitutes. Salad dressings with cheeseor sour cream. Beverages Regular sodas and any drinks with added sugar. Sweets and desserts Frosting. Pudding. Cookies. Cakes. Pies. Milk chocolate or white chocolate.Buttered syrups. Full-fat ice cream or ice cream drinks. The items listed above may not be a complete list of foods and beverages to  avoid. Contact a dietitian for more information. Summary Heart-healthy meal planning includes limiting unhealthy fats, increasing healthy fats, and making other diet and lifestyle changes. Lose weight if you are overweight. Losing just 5-10% of your body weight can help your overall health and prevent diseases such as diabetes and heart disease. Focus on eating a balance of foods, including fruits and vegetables, low-fat or nonfat dairy, lean protein, nuts and legumes, whole grains, and heart-healthy oils and fats. This information is not intended to replace advice given to you by your health care provider. Make sure you discuss any questions you have with your healthcare provider. Document Revised: 09/21/2017 Document Reviewed: 09/21/2017 Elsevier Patient Education  2022 Elsevier Inc.  

## 2021-02-23 NOTE — Progress Notes (Signed)
Established Patient Office Visit  Subjective:  Patient ID: Keith Vance, male    DOB: [birth year]  Age: 60 y.o. MRN: [id number]  CC:  Chief Complaint  Patient presents with   Follow-up   Hypertension   Hyperlipidemia    HPI Keith Vance presents for follow-up on hypertension and hyperlipidemia.  Patient went to the ED 02/16/2021 for nausea, vomiting and diarrhea.  Reports felt sick after eating some oranges which were from [place].  States is feeling much better and has returned to work.  HTN: Pt denies chest pain, palpitations, dizziness or lower extremity swelling. Taking medication as directed without side effects. Reports ambulatory BP readings have been 120s/70-80s. Pt has significantly reduced sodium and increased water intake.  Hyperlipidemia: Followed by cardiology.  Reports has not started Repatha due to cost given medication is not covered by his insurance.  Taking atorvastatin without issues. Has decreased prepackaged foods, sodas and eating more salads, fruits and vegetables.  OSA: Patient reports is getting about 4 hours of sleep per night.  Current CPAP machine is old and likely not working properly. States it has been several years since his last sleep study.  Weight: Followed by [facility]. States has made dietary changes and has already noticed a change with how he feels with weight loss thus far.  Past Medical History:  Diagnosis Date   Arthritis    Ascending aortic aneurysm ([place])    a. 10/2015 CTA chest: 4.4cm aneurysmal dil of Asc Ao-->rec f/u in 1 yr.   Back pain    Chest pain 10/11/2018   Fatty liver    GERD (gastroesophageal reflux disease)    hx   Heart disease    History of kidney stones    Hyperlipidemia    Hypertension    Joint pain    Liver disease    Lower extremity edema    Obesity    Pneumonia    hx   Prediabetes    Rheumatoid arthritis (H[facility])    SBO (small bowel obstruction) ([place])    a. 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    Sleep apnea    cpap    Past Surgical History:  Procedure Laterality Date   HERNIA REPAIR Left    inguinal, and umbilical   JOINT REPLACEMENT     REVISION TOTAL KNEE ARTHROPLASTY Left 07/30/2017   LEFT TOTAL KNEE PARTIAL REVISION AND SYNOVECTOMY/notes 07/30/2017   TOTAL KNEE ARTHROPLASTY Left 04/24/2016   Procedure: LEFT TOTAL KNEE ARTHROPLASTY;  Surgeon: Paralee Cancel, MD;  Location: [facility];  Service: Orthopedics;  Laterality: Left;   TOTAL KNEE REVISION Left 07/30/2017   Procedure: LEFT TOTAL KNEE PARTIAL REVISION AND SYNOVECTOMY;  Surgeon: Frederik Pear, MD;  Location: [place];  Service: Orthopedics;  Laterality: Left;    Family History  Problem Relation Age of Onset   Cancer Mother    Hypertension Mother    Hyperlipidemia Mother    Stroke Mother    Heart disease Mother    Depression Mother    Hypertension Father    Diabetes Father    Cancer Father        bone   Hyperlipidemia Father    Heart attack Maternal Grandmother        >41s of age    Social History   Socioeconomic History   Marital status: Married    Spouse name: Not on file   Number of children: Not on file   Years of education: Not on file  Highest education level: Not on file  Occupational History   Occupation: welder Nutritional therapist  Tobacco Use   Smoking status: Never   Smokeless tobacco: Never   Tobacco comments:    off and on as a teenager - very little per patient   Vaping Use   Vaping Use: Never used  Substance and Sexual Activity   Alcohol use: No   Drug use: No   Sexual activity: Yes  Other Topics Concern   Not on file  Social History Narrative   Not on file   Social Determinants of Health   Financial Resource Strain: Not on file  Food Insecurity: Not on file  Transportation Needs: Not on file  Physical Activity: Not on file  Stress: Not on file  Social Connections: Not on file  Intimate Partner Violence: Not on file    Outpatient Medications Prior to Visit  Medication Sig Dispense  Refill   amLODipine (NORVASC) 10 MG tablet Take 1 tablet (10 mg total) by mouth daily. 90 tablet 3   amoxicillin-clavulanate (AUGMENTIN) 875-125 MG tablet Take 1 tablet by mouth 2 (two) times daily for 10 days. 20 tablet 0   aspirin EC 81 MG EC tablet Take 1 tablet (81 mg total) by mouth daily. 30 tablet 0   atorvastatin (LIPITOR) 40 MG tablet Take 1 tablet (40 mg total) by mouth at bedtime. 90 tablet 3   carvedilol (COREG) 12.5 MG tablet Take 1 tablet (12.5 mg total) by mouth 2 (two) times daily with a meal. 180 tablet 1   losartan-hydrochlorothiazide (HYZAAR) 100-12.5 MG tablet TAKE 1 TABLET BY MOUTH DAILY 90 tablet 0   Multiple Vitamins-Minerals (MULTIVITAMIN WITH MINERALS) tablet Take 1 tablet by mouth daily.     Omega-3 Fatty Acids (FISH OIL) 1200 MG CAPS Take 1,200 mg by mouth daily.     Vitamin D, Ergocalciferol, (DRISDOL) 1.25 MG (50000 UNIT) CAPS capsule TAKE 1 CAPSULE BY MOUTH WEEKLY **PLEASE SCHEDULE A FOLLOW UP APPT FOR FUTURE MED REFILLS** 12 capsule 0   Docusate Calcium (STOOL SOFTENER PO) Take by mouth. (Patient not taking: Reported on 02/23/2021)     Evolocumab (REPATHA SURECLICK) 428 MG/ML SOAJ Inject 140 mg into the skin every 14 (fourteen) days. (Patient not taking: No sig reported) 2 mL 11   No facility-administered medications prior to visit.    No Known Allergies  ROS Review of Systems A fourteen system review of systems was performed and found to be positive as per HPI.   Objective:    Physical Exam General:  Pleasant and cooperative, in no acute distress  Neuro:  Alert and oriented,  extra-ocular muscles intact  HEENT:  Normocephalic, atraumatic, neck supple Skin:  no gross rash, warm, pink. Cardiac:  RRR Respiratory:  ECTA B/L w/o wheezing, Not using accessory muscles, speaking in full sentences- unlabored. Vascular:  Ext warm, no cyanosis apprec.; cap RF less 2 sec. No gross edema Psych:  No HI/SI, judgement and insight good, Euthymic mood. Full Affect.  BP  134/83   Pulse 62   Temp 98.3 F (36.8 C)   Ht 5' 9"  (1.753 m)   Wt 278 lb 12.8 oz (126.5 kg)   SpO2 96%   BMI 41.17 kg/m  Wt Readings from Last 3 Encounters:  02/23/21 278 lb 12.8 oz (126.5 kg)  02/22/21 276 lb (125.2 kg)  02/08/21 276 lb (125.2 kg)     Health Maintenance Due  Topic Date Due   TETANUS/TDAP  Never done   COLONOSCOPY (Pts 45-47yr Insurance  coverage will need to be confirmed)  Never done   Zoster Vaccines- Shingrix (1 of 2) Never done   COVID-19 Vaccine (3 - Booster for Pfizer series) 03/07/2020    There are no preventive care reminders to display for this patient.  Lab Results  Component Value Date   TSH 2.460 02/08/2021   Lab Results  Component Value Date   WBC 9.4 02/16/2021   HGB 15.2 02/16/2021   HCT 42.8 02/16/2021   MCV 90.9 02/16/2021   PLT 116 (L) 02/16/2021   Lab Results  Component Value Date   NA 133 (L) 02/16/2021   K 3.3 (L) 02/16/2021   CO2 22 02/16/2021   GLUCOSE 133 (H) 02/16/2021   BUN 22 (H) 02/16/2021   CREATININE 1.73 (H) 02/16/2021   BILITOT 1.1 02/16/2021   ALKPHOS 74 02/16/2021   AST 24 02/16/2021   ALT 35 02/16/2021   PROT 7.2 02/16/2021   ALBUMIN 4.1 02/16/2021   CALCIUM 9.0 02/16/2021   ANIONGAP 13 02/16/2021   EGFR 92 02/08/2021   Lab Results  Component Value Date   CHOL 171 02/11/2021   Lab Results  Component Value Date   HDL 60 02/11/2021   Lab Results  Component Value Date   LDLCALC 93 02/11/2021   Lab Results  Component Value Date   TRIG 102 02/11/2021   Lab Results  Component Value Date   CHOLHDL 2.9 02/11/2021   Lab Results  Component Value Date   HGBA1C 5.7 (H) 02/08/2021      Assessment & Plan:   Problem List Items Addressed This Visit       Cardiovascular and Mediastinum   Essential hypertension - Primary (Chronic)     Respiratory   Sleep apnea (Chronic)   Relevant Orders   PSG Sleep Study     Other   Dyslipidemia, goal LDL below 70   Other Visit Diagnoses      Colitis       Obesity, Class III, BMI 40-49.9 (morbid obesity) ([place])          Essential hypertension: -Stable. -Continue current medication regimen. -Reviewed labs 02/08/2021, CMP: renal function and electrolytes normal. -Encourage to continue weight loss efforts. -Will continue to monitor.  Dyslipidemia, goal LDL below 70: -Recent lipid panel wnl's, LDL 93 (goal <70). -Followed by cardiology/lipid clinic. -On Lipitor 40 mg. Unable to obtain Repatha, too expensive. Recent hepatic function: AST 24, ALT 35 -Encourage to continue with weight loss efforts and dietary changes. Follow a low fat diet. -Will continue to monitor.  Obesity, Class III, BMI 40-44.9: -Associated with hypertension, dyslipidemia, and sleep apnea. -Encourage to continue weight loss efforts with dietary and lifestyle modifications. -Continue to follow up with [facility].  Sleep apnea: -Will place new sleep study order.  Colitis: -Resolved.    No orders of the defined types were placed in this encounter.   Follow-up: Return for CPE in 4-5 months .   Note:  This note was prepared with assistance of Dragon voice recognition software. Occasional wrong-word or sound-a-like substitutions may have occurred due to the inherent limitations of voice recognition software.  Lorrene Reid, PA-C

## 2021-03-09 ENCOUNTER — Other Ambulatory Visit: Payer: Self-pay

## 2021-03-09 ENCOUNTER — Ambulatory Visit (INDEPENDENT_AMBULATORY_CARE_PROVIDER_SITE_OTHER): Payer: BC Managed Care – PPO | Admitting: Family Medicine

## 2021-03-09 ENCOUNTER — Encounter (INDEPENDENT_AMBULATORY_CARE_PROVIDER_SITE_OTHER): Payer: Self-pay | Admitting: Family Medicine

## 2021-03-09 VITALS — BP 134/76 | HR 64 | Temp 98.2°F | Ht 69.0 in | Wt 273.0 lb

## 2021-03-09 DIAGNOSIS — R931 Abnormal findings on diagnostic imaging of heart and coronary circulation: Secondary | ICD-10-CM

## 2021-03-09 DIAGNOSIS — R7303 Prediabetes: Secondary | ICD-10-CM | POA: Diagnosis not present

## 2021-03-09 DIAGNOSIS — Z6841 Body Mass Index (BMI) 40.0 and over, adult: Secondary | ICD-10-CM | POA: Diagnosis not present

## 2021-03-15 NOTE — Progress Notes (Signed)
Chief Complaint:   OBESITY Keith Vance is here to discuss his progress with his obesity treatment plan along with follow-up of his obesity related diagnoses. Keith Vance is on practicing portion control and making smarter food choices, such as increasing vegetables and decreasing simple carbohydrates and states he is following his eating plan approximately 80% of the time. Keith Vance states he is  doing 0 minutes 0 times per week.  Today's visit was #: 3 Starting weight: 276 lbs Starting date: 02/08/2021 Today's weight: 273 lbs Today's date: 03/09/2021 Total lbs lost to date: 3 lbs Total lbs lost since last in-office visit: 3 lbs  Interim History: Keith Vance started working on his weight loss December of 2022 at 315 lbs. He is doing his own meal plan and doing very well. He does not always get protein in at all meals. His work is very physical. 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 He eats low sodium (<1000 mg per day).  Subjective:   1. Elevated coronary artery calcium score Dejuan is followed by Dr. Allyson Sabal, his cardiologist. He denies chest pain and shortness of breath. His HTN is well controlled. He follows a low sodium diet ( <1000 mg per day) and his blood pressure has responded very well. BP Readings from Last 3 Encounters:  03/09/21 134/76  02/23/21 134/83  02/22/21 123/78     2. Pre-diabetes Nevan's last A1C was 5.7. He is not on Metformin. He says he is never hungry. He denies nausea or hypoglycemia.  Lab Results  Component Value Date   HGBA1C 5.7 (H) 02/08/2021   Lab Results  Component Value Date   INSULIN 18.3 02/08/2021    Assessment/Plan:   1. Elevated coronary artery calcium score Keith Vance is to follow up with cardiology and continue all his medications. H  Repetitive spaced learning was employed today to elicit superior memory formation and behavioral change.   2. Pre-diabetes Keith Vance will continue his meal plan and will continue to work on weight  loss, exercise, and decreasing simple carbohydrates to help decrease the risk of diabetes.    3. Obesity, current BMI 40.3 Keith Vance is currently in the action stage of change. As such, his goal is to continue with weight loss efforts. He has agreed to practicing portion control and making smarter food choices, such as increasing vegetables and decreasing simple carbohydrates.   He will have protein at all meals and continue low sodium diet.  Exercise goals:  As is.  Behavioral modification strategies: decreasing sodium intake and meal planning and cooking strategies.  Keith Vance has agreed to follow-up with our clinic in 3 weeks.  Objective:   Blood pressure 134/76, pulse 64, temperature 98.2 F (36.8 C), height 5\' 9"  (1.753 m), weight 273 lb (123.8 kg), SpO2 96 %. Body mass index is 40.32 kg/m.  General: Cooperative, alert, well developed, in no acute distress. HEENT: Conjunctivae and lids unremarkable. Cardiovascular: Regular rhythm.  Lungs: Normal work of breathing. Neurologic: No focal deficits.   Lab Results  Component Value Date   CREATININE 1.73 (H) 02/16/2021   BUN 22 (H) 02/16/2021   NA 133 (L) 02/16/2021   K 3.3 (L) 02/16/2021   CL 98 02/16/2021   CO2 22 02/16/2021   Lab Results  Component Value Date   ALT 35 02/16/2021   AST 24 02/16/2021   ALKPHOS 74 02/16/2021   BILITOT 1.1 02/16/2021   Lab Results  Component Value Date   HGBA1C 5.7 (H) 02/08/2021  HGBA1C 5.7 (H) 01/02/2020   HGBA1C 5.6 02/24/2019   HGBA1C 5.9 (H) 10/12/2018   HGBA1C 5.5 03/04/2018   Lab Results  Component Value Date   INSULIN 18.3 02/08/2021   Lab Results  Component Value Date   TSH 2.460 02/08/2021   Lab Results  Component Value Date   CHOL 171 02/11/2021   HDL 60 02/11/2021   LDLCALC 93 02/11/2021   TRIG 102 02/11/2021   CHOLHDL 2.9 02/11/2021   Lab Results  Component Value Date   VD25OH 46.3 02/08/2021   VD25OH 29.5 (L) 01/02/2020   VD25OH 24.8 (L) 02/24/2019   Lab Results   Component Value Date   WBC 9.4 02/16/2021   HGB 15.2 02/16/2021   HCT 42.8 02/16/2021   MCV 90.9 02/16/2021   PLT 116 (L) 02/16/2021   No results found for: IRON, TIBC, FERRITIN  Attestation Statements:   Reviewed by clinician on day of visit: allergies, medications, problem list, medical history, surgical history, family history, social history, and previous encounter notes.  Time spent on visit including pre-visit chart review and post-visit care and charting was 35 minutes.   I, Jackson Latino, RMA, am acting as Energy manager for [place], FNP.  I have reviewed the above documentation for accuracy and completeness, and I agree with the above. -  Jesse Sans, FNP

## 2021-03-17 ENCOUNTER — Encounter (INDEPENDENT_AMBULATORY_CARE_PROVIDER_SITE_OTHER): Payer: Self-pay | Admitting: Family Medicine

## 2021-03-17 DIAGNOSIS — Z6841 Body Mass Index (BMI) 40.0 and over, adult: Secondary | ICD-10-CM | POA: Insufficient documentation

## 2021-03-30 ENCOUNTER — Other Ambulatory Visit: Payer: Self-pay

## 2021-03-30 ENCOUNTER — Ambulatory Visit (INDEPENDENT_AMBULATORY_CARE_PROVIDER_SITE_OTHER): Payer: BC Managed Care – PPO | Admitting: Family Medicine

## 2021-03-30 ENCOUNTER — Encounter (INDEPENDENT_AMBULATORY_CARE_PROVIDER_SITE_OTHER): Payer: Self-pay | Admitting: Family Medicine

## 2021-03-30 VITALS — BP 136/80 | HR 62 | Temp 98.0°F | Ht 69.0 in | Wt 271.0 lb

## 2021-03-30 DIAGNOSIS — Z9189 Other specified personal risk factors, not elsewhere classified: Secondary | ICD-10-CM | POA: Diagnosis not present

## 2021-03-30 DIAGNOSIS — E65 Localized adiposity: Secondary | ICD-10-CM

## 2021-03-30 DIAGNOSIS — E66813 Obesity, class 3: Secondary | ICD-10-CM

## 2021-03-30 DIAGNOSIS — E785 Hyperlipidemia, unspecified: Secondary | ICD-10-CM | POA: Diagnosis not present

## 2021-03-30 DIAGNOSIS — R7303 Prediabetes: Secondary | ICD-10-CM

## 2021-03-30 DIAGNOSIS — Z6841 Body Mass Index (BMI) 40.0 and over, adult: Secondary | ICD-10-CM

## 2021-03-30 DIAGNOSIS — I10 Essential (primary) hypertension: Secondary | ICD-10-CM

## 2021-04-05 NOTE — Progress Notes (Signed)
Chief Complaint:   OBESITY Keith Vance is here to discuss his progress with his obesity treatment plan along with follow-up of his obesity related diagnoses.   Today's visit was #: 4 Starting weight: 276 lbs Starting date: 02/08/2021 Today's weight: 271 lbs Today's date: 03/30/2021 Weight change since last visit: 2 lbs Total lbs lost to date: 5 lbs Body mass index is 40.02 kg/m.  Total weight loss percentage to date: -1.81%  Interim History:  G[place]wood added a 6-8 ounce chicken breast and 2 boiled eggs per day to his diet.  Denies polyphagia.  We discussed Ozempic/Mounjaro for future use.  Holding for now.  Current Meal Plan: practicing portion control and making smarter food choices, such as increasing vegetables and decreasing simple carbohydrates for 90-95% of the time.  Current Exercise Plan: None.  Assessment/Plan:   1. Pre-diabetes At goal. Goal is HgbA1c < 5.7.  Medication: None.    Plan:  He will continue to focus on protein-rich, low simple carbohydrate foods. We reviewed the importance of hydration, regular exercise for stress reduction, and restorative sleep.   Lab Results  Component Value Date   HGBA1C 5.7 (H) 02/08/2021   Lab Results  Component Value Date   INSULIN 18.3 02/08/2021   2. Dyslipidemia, goal LDL below 70 Course: At goal. Lipid-lowering medications: Lipitor 40 mg daily, Repatha 140 mg every 2 weeks.   Plan: Dietary changes: Increase soluble fiber, decrease simple carbohydrates, decrease saturated fat. Exercise changes: Moderate to vigorous-intensity aerobic activity 150 minutes per week or as tolerated. We will continue to monitor along with PCP/specialists as it pertains to his weight loss journey.  Lab Results  Component Value Date   CHOL 171 02/11/2021   HDL 60 02/11/2021   LDLCALC 93 02/11/2021   TRIG 102 02/11/2021   CHOLHDL 2.9 02/11/2021   Lab Results  Component Value Date   ALT 35 02/16/2021   AST 24 02/16/2021   ALKPHOS 74 02/16/2021    BILITOT 1.1 02/16/2021   The 10-year ASCVD risk score Denman George DC Jr., et al., 2013) is: 7.7%   Values used to calculate the score:     Age: 35 years     Sex: Male     Is Non-Hispanic African American: No     Diabetic: No     Tobacco smoker: No     Systolic Blood Pressure: 136 mmHg     Is BP treated: Yes     HDL Cholesterol: 60 mg/dL     Total Cholesterol: 171 mg/dL  3. Essential hypertension At goal. Medications: Norvasc 10 mg daily, Coreg 12.5 mg twice daily, Hyzaar 100-12.5 mg daily.   Plan: Avoid buying foods that are: processed, frozen, or prepackaged to avoid excess salt. We will watch for signs of hypotension as he continues lifestyle modifications.  BP Readings from Last 3 Encounters:  03/30/21 136/80  03/09/21 134/76  02/23/21 134/83   Lab Results  Component Value Date   CREATININE 1.73 (H) 02/16/2021   4. Visceral obesity Current visceral fat rating: 24. Visceral fat rating should be < 13. Visceral adipose tissue is a hormonally active component of total body fat. This body composition phenotype is associated with medical disorders such as metabolic syndrome, cardiovascular disease and several malignancies including prostate, breast, and colorectal cancers. Starting goal: Lose 7-10% of starting weight.   5. At risk for heart disease Due to Wyndell's current state of health and medical condition(s), he is at a higher risk for heart disease.  This  puts the patient at much greater risk to subsequently develop cardiopulmonary conditions that can significantly affect patient's quality of life in a negative manner.    At least 8 minutes were spent on counseling Isiaah about these concerns today. Evidence-based interventions for health behavior change were utilized today including the discussion of self monitoring techniques, problem-solving barriers, and SMART goal setting techniques.  Specifically, regarding patient's less desirable eating habits and patterns, we employed the technique  of small changes when Khylan has not been able to fully commit to his prudent nutritional plan.  6. Obesity, current BMI 40.1  Course: Daishon is currently in the action stage of change. As such, his goal is to continue with weight loss efforts.   Nutrition goals: He has agreed to following a lower carbohydrate, vegetable and lean protein rich diet plan.   Exercise goals:  Increase NEAT - has active job.  Behavioral modification strategies: increasing lean protein intake, decreasing simple carbohydrates, increasing vegetables, and increasing water intake.  Chen has agreed to follow-up with our clinic in 3-4 weeks. He was informed of the importance of frequent follow-up visits to maximize his success with intensive lifestyle modifications for his multiple health conditions.   Objective:   Blood pressure 136/80, pulse 62, temperature 98 F (36.7 C), temperature source Oral, height 5\' 9"  (1.753 m), weight 271 lb (122.9 kg), SpO2 96 %. Body mass index is 40.02 kg/m.  General: Cooperative, alert, well developed, in no acute distress. HEENT: Conjunctivae and lids unremarkable. Cardiovascular: Regular rhythm.  Lungs: Normal work of breathing. Neurologic: No focal deficits.   Lab Results  Component Value Date   CREATININE 1.73 (H) 02/16/2021   BUN 22 (H) 02/16/2021   NA 133 (L) 02/16/2021   K 3.3 (L) 02/16/2021   CL 98 02/16/2021   CO2 22 02/16/2021   Lab Results  Component Value Date   ALT 35 02/16/2021   AST 24 02/16/2021   ALKPHOS 74 02/16/2021   BILITOT 1.1 02/16/2021   Lab Results  Component Value Date   HGBA1C 5.7 (H) 02/08/2021   HGBA1C 5.7 (H) 01/02/2020   HGBA1C 5.6 02/24/2019   HGBA1C 5.9 (H) 10/12/2018   HGBA1C 5.5 03/04/2018   Lab Results  Component Value Date   INSULIN 18.3 02/08/2021   Lab Results  Component Value Date   TSH 2.460 02/08/2021   Lab Results  Component Value Date   CHOL 171 02/11/2021   HDL 60 02/11/2021   LDLCALC 93 02/11/2021   TRIG 102  02/11/2021   CHOLHDL 2.9 02/11/2021   Lab Results  Component Value Date   VD25OH 46.3 02/08/2021   VD25OH 29.5 (L) 01/02/2020   VD25OH 24.8 (L) 02/24/2019   Lab Results  Component Value Date   WBC 9.4 02/16/2021   HGB 15.2 02/16/2021   HCT 42.8 02/16/2021   MCV 90.9 02/16/2021   PLT 116 (L) 02/16/2021   Attestation Statements:   Reviewed by clinician on day of visit: allergies, medications, problem list, medical history, surgical history, family history, social history, and previous encounter notes.  I, 02/18/2021, CMA, am acting as transcriptionist for Insurance claims handler, DO  I have reviewed the above documentation for accuracy and completeness, and I agree with the above. Helane Rima, DO

## 2021-04-10 ENCOUNTER — Encounter (HOSPITAL_BASED_OUTPATIENT_CLINIC_OR_DEPARTMENT_OTHER): Payer: BC Managed Care – PPO | Admitting: Internal Medicine

## 2021-04-14 ENCOUNTER — Other Ambulatory Visit: Payer: Self-pay | Admitting: Physician Assistant

## 2021-04-14 DIAGNOSIS — I712 Thoracic aortic aneurysm, without rupture, unspecified: Secondary | ICD-10-CM

## 2021-04-14 DIAGNOSIS — I10 Essential (primary) hypertension: Secondary | ICD-10-CM

## 2021-04-20 ENCOUNTER — Ambulatory Visit (INDEPENDENT_AMBULATORY_CARE_PROVIDER_SITE_OTHER): Payer: BC Managed Care – PPO | Admitting: Family Medicine

## 2021-04-27 ENCOUNTER — Other Ambulatory Visit: Payer: Self-pay | Admitting: Physician Assistant

## 2021-04-27 DIAGNOSIS — I10 Essential (primary) hypertension: Secondary | ICD-10-CM

## 2021-05-04 ENCOUNTER — Other Ambulatory Visit: Payer: Self-pay | Admitting: Physician Assistant

## 2021-05-04 DIAGNOSIS — E559 Vitamin D deficiency, unspecified: Secondary | ICD-10-CM

## 2021-05-11 ENCOUNTER — Telehealth (INDEPENDENT_AMBULATORY_CARE_PROVIDER_SITE_OTHER): Payer: BC Managed Care – PPO | Admitting: Family Medicine

## 2021-05-12 ENCOUNTER — Encounter (INDEPENDENT_AMBULATORY_CARE_PROVIDER_SITE_OTHER): Payer: Self-pay | Admitting: Family Medicine

## 2021-05-12 ENCOUNTER — Other Ambulatory Visit: Payer: Self-pay

## 2021-05-12 ENCOUNTER — Encounter (INDEPENDENT_AMBULATORY_CARE_PROVIDER_SITE_OTHER): Payer: Self-pay

## 2021-05-12 ENCOUNTER — Telehealth (INDEPENDENT_AMBULATORY_CARE_PROVIDER_SITE_OTHER): Payer: BC Managed Care – PPO | Admitting: Family Medicine

## 2021-05-12 DIAGNOSIS — I10 Essential (primary) hypertension: Secondary | ICD-10-CM

## 2021-05-12 DIAGNOSIS — Z6841 Body Mass Index (BMI) 40.0 and over, adult: Secondary | ICD-10-CM

## 2021-05-12 DIAGNOSIS — E785 Hyperlipidemia, unspecified: Secondary | ICD-10-CM | POA: Diagnosis not present

## 2021-05-12 DIAGNOSIS — R7303 Prediabetes: Secondary | ICD-10-CM

## 2021-05-12 DIAGNOSIS — E65 Localized adiposity: Secondary | ICD-10-CM

## 2021-05-13 NOTE — Progress Notes (Signed)
TeleHealth Visit:  Due to the COVID-19 pandemic, this visit was completed with telemedicine (audio/video) technology to reduce patient and provider exposure as well as to preserve personal protective equipment.   Keylor has verbally consented to this TeleHealth visit. The patient is located at home, the provider is located at the [facility] office. The participants in this visit include the listed provider and patient. The visit was conducted today via MyChart video.  Chief Complaint: OBESITY Jhamal is here to discuss his progress with his obesity treatment plan along with follow-up of his obesity related diagnoses. Jowel is on following a lower carbohydrate, vegetable and lean protein rich diet plan and states he is following his eating plan approximately 50-60% of the time. Jaelan states he has increased his activity.  Today's visit was #: 5 Starting weight: 276 lbs Starting date: 02/08/2021  Interim History: Rajah's home blood pressure is 121/89.  He says he has been working 11-12 hours per day and has been active.  He feels that he may have lost a few pounds.    Assessment/Plan:   1. Pre-diabetes At goal. Goal is HgbA1c < 5.7.  Medication: None.    Plan:  He will continue to focus on protein-rich, low simple carbohydrate foods. We reviewed the importance of hydration, regular exercise for stress reduction, and restorative sleep.   Lab Results  Component Value Date   HGBA1C 5.7 (H) 02/08/2021   Lab Results  Component Value Date   INSULIN 18.3 02/08/2021   2. Dyslipidemia, goal LDL below 70 Course: Controlled. Lipid-lowering medications: Lipitor 40 mg daily, Repatha 140 mg every 14 days, and fish oil.   Plan: Dietary changes: Increase soluble fiber, decrease simple carbohydrates, decrease saturated fat. Exercise changes: Moderate to vigorous-intensity aerobic activity 150 minutes per week or as tolerated. We will continue to monitor along with PCP/specialists as it pertains  to his weight loss journey.  Lab Results  Component Value Date   CHOL 171 02/11/2021   HDL 60 02/11/2021   LDLCALC 93 02/11/2021   TRIG 102 02/11/2021   CHOLHDL 2.9 02/11/2021   Lab Results  Component Value Date   ALT 35 02/16/2021   AST 24 02/16/2021   ALKPHOS 74 02/16/2021   BILITOT 1.1 02/16/2021   The 10-year ASCVD risk score (Arnett DK, et al., 2019) is: 7.7%   Values used to calculate the score:     Age: 62 years     Sex: Male     Is Non-Hispanic African American: No     Diabetic: No     Tobacco smoker: No     Systolic Blood Pressure: 136 mmHg     Is BP treated: Yes     HDL Cholesterol: 60 mg/dL     Total Cholesterol: 171 mg/dL  3. Essential hypertension Not at goal. Medications: Norvasc 10 mg daily, Coreg 12.5 mg twice daily, Hyzaar 100-12.5 mg daily.   Plan: Avoid buying foods that are: processed, frozen, or prepackaged to avoid excess salt. We will watch for signs of hypotension as he continues lifestyle modifications.  BP Readings from Last 3 Encounters:  03/30/21 136/80  03/09/21 134/76  02/23/21 134/83   Lab Results  Component Value Date   CREATININE 1.73 (H) 02/16/2021   4. Visceral obesity Current visceral fat rating: 24. Visceral fat rating should be < 13. Visceral adipose tissue is a hormonally active component of total body fat. This body composition phenotype is associated with medical disorders such as metabolic syndrome,  cardiovascular disease and several malignancies including prostate, breast, and colorectal cancers. Starting goal: Lose 7-10% of starting weight.   5. Obesity, current BMI 40.1  Lukah is currently in the action stage of change. As such, his goal is to continue with weight loss efforts. He has agreed to following a lower carbohydrate, vegetable and lean protein rich diet plan.   Exercise goals:  As is.  Behavioral modification strategies: increasing lean protein intake, decreasing simple carbohydrates, increasing vegetables, and  increasing water intake.  Kodey has agreed to follow-up with our clinic in 4 weeks. He was informed of the importance of frequent follow-up visits to maximize his success with intensive lifestyle modifications for his multiple health conditions.  Objective:   VITALS: Per patient if applicable, see vitals. GENERAL: Alert and in no acute distress. CARDIOPULMONARY: No increased WOB. Speaking in clear sentences.  PSYCH: Pleasant and cooperative. Speech normal rate and rhythm. Affect is appropriate. Insight and judgement are appropriate. Attention is focused, linear, and appropriate.  NEURO: Oriented as arrived to appointment on time with no prompting.   Lab Results  Component Value Date   CREATININE 1.73 (H) 02/16/2021   BUN 22 (H) 02/16/2021   NA 133 (L) 02/16/2021   K 3.3 (L) 02/16/2021   CL 98 02/16/2021   CO2 22 02/16/2021   Lab Results  Component Value Date   ALT 35 02/16/2021   AST 24 02/16/2021   ALKPHOS 74 02/16/2021   BILITOT 1.1 02/16/2021   Lab Results  Component Value Date   HGBA1C 5.7 (H) 02/08/2021   HGBA1C 5.7 (H) 01/02/2020   HGBA1C 5.6 02/24/2019   HGBA1C 5.9 (H) 10/12/2018   HGBA1C 5.5 03/04/2018   Lab Results  Component Value Date   INSULIN 18.3 02/08/2021   Lab Results  Component Value Date   TSH 2.460 02/08/2021   Lab Results  Component Value Date   CHOL 171 02/11/2021   HDL 60 02/11/2021   LDLCALC 93 02/11/2021   TRIG 102 02/11/2021   CHOLHDL 2.9 02/11/2021   Lab Results  Component Value Date   VD25OH 46.3 02/08/2021   VD25OH 29.5 (L) 01/02/2020   VD25OH 24.8 (L) 02/24/2019   Lab Results  Component Value Date   WBC 9.4 02/16/2021   HGB 15.2 02/16/2021   HCT 42.8 02/16/2021   MCV 90.9 02/16/2021   PLT 116 (L) 02/16/2021   Attestation Statements:   Reviewed by clinician on day of visit: allergies, medications, problem list, medical history, surgical history, family history, social history, and previous encounter notes.  I, Comptroller, CMA, am acting as transcriptionist for Helane Rima, DO  I have reviewed the above documentation for accuracy and completeness, and I agree with the above. Helane Rima, DO

## 2021-06-07 ENCOUNTER — Ambulatory Visit (HOSPITAL_BASED_OUTPATIENT_CLINIC_OR_DEPARTMENT_OTHER): Payer: BC Managed Care – PPO | Attending: Physician Assistant | Admitting: Internal Medicine

## 2021-06-07 ENCOUNTER — Other Ambulatory Visit: Payer: Self-pay

## 2021-06-07 VITALS — Ht 70.0 in | Wt 284.0 lb

## 2021-06-07 DIAGNOSIS — R0902 Hypoxemia: Secondary | ICD-10-CM | POA: Insufficient documentation

## 2021-06-07 DIAGNOSIS — I493 Ventricular premature depolarization: Secondary | ICD-10-CM | POA: Diagnosis not present

## 2021-06-07 DIAGNOSIS — G4733 Obstructive sleep apnea (adult) (pediatric): Secondary | ICD-10-CM | POA: Insufficient documentation

## 2021-06-15 ENCOUNTER — Ambulatory Visit (INDEPENDENT_AMBULATORY_CARE_PROVIDER_SITE_OTHER): Payer: BC Managed Care – PPO | Admitting: Family Medicine

## 2021-06-18 DIAGNOSIS — G4733 Obstructive sleep apnea (adult) (pediatric): Secondary | ICD-10-CM

## 2021-06-18 NOTE — Procedures (Signed)
    Patient Name: Keith Vance, Keith Vance Date: 06/07/2021 Gender: Male D.O.B: Sep 15, 1960 Age (years): 60 Referring Provider: Mayer Masker PA-C Height (inches): 72 Interpreting Physician: Jetty Duhamel MD, ABSM Weight (lbs): 284 RPSGT: Armen Pickup BMI: 39 MRN: [id number] Neck Size: 18.00  CLINICAL INFORMATION Sleep Study Type: NPSG Indication for sleep study: Hypertension, Non-refreshing Sleep, Obesity, Snoring, Witnesses Apnea / Gasping During Sleep Epworth Sleepiness Score: 2  SLEEP STUDY TECHNIQUE As per the AASM Manual for the Scoring of Sleep and Associated Events v2.3 (April 2016) with a hypopnea requiring 4% desaturations.  The channels recorded and monitored were frontal, central and occipital EEG, electrooculogram (EOG), submentalis EMG (chin), nasal and oral airflow, thoracic and abdominal wall motion, anterior tibialis EMG, snore microphone, electrocardiogram, and pulse oximetry.  MEDICATIONS Medications self-administered by patient taken the night of the study : none reported  SLEEP ARCHITECTURE The study was initiated at 10:01:04 PM and ended at 4:15:02 AM.  Sleep onset time was 3.3 minutes and the sleep efficiency was 88.8%%. The total sleep time was 332 minutes.  Stage REM latency was 214.5 minutes.  The patient spent 12.0%% of the night in stage N1 sleep, 73.6%% in stage N2 sleep, 0.0%% in stage N3 and 14.3% in REM.  Alpha intrusion was absent.  Supine sleep was 37.33%.  RESPIRATORY PARAMETERS The overall apnea/hypopnea index (AHI) was 79.7 per hour. There were 289 total apneas, including 287 obstructive, 1 central and 1 mixed apneas. There were 152 hypopneas and 28 RERAs.  The AHI during Stage REM sleep was 72.0 per hour.  AHI while supine was 82.3 per hour.  The mean oxygen saturation was 89.6%. The minimum SpO2 during sleep was 69.0%.  moderate snoring was noted during this study.  CARDIAC DATA The 2 lead EKG demonstrated sinus rhythm. The mean  heart rate was 58.9 beats per minute. Other EKG findings include: PVCs..  LEG MOVEMENT DATA The total PLMS were 0 with a resulting PLMS index of 0.0. Associated arousal with leg movement index was 0.0 .  IMPRESSIONS - Severe obstructive sleep apnea occurred during this study (AHI = 79.7/h). - No significant central sleep apnea occurred during this study (CAI = 0.2/h). - Severe oxygen desaturation was noted during this study (Min O2 = 69.0%). Mean O2 saturation 89.6%. - The patient snored with moderate snoring volume. - Occasional PVCs. - Clinically significant periodic limb movements did not occur during sleep. No significant associated arousals.  DIAGNOSIS - Obstructive Sleep Apnea (G47.33) - Nocturnal Hypoxemia (G47.36)  RECOMMENDATIONS - Suggest return for CPAP titration study. This will provide insurance documentation if supplemental O2 is also needed. - Be careful with alcohol, sedatives and other CNS depressants that may worsen sleep apnea and disrupt normal sleep architecture. - Sleep hygiene should be reviewed to assess factors that may improve sleep quality. - Weight management and regular exercise should be initiated or continued if appropriate.  [Electronically signed] 06/18/2021 10:10 AM  Jetty Duhamel MD, ABSM Diplomate, American Board of Sleep Medicine   NPI: [id number]                         Jetty Duhamel Diplomate, American Board of Sleep Medicine  ELECTRONICALLY SIGNED ON:  06/18/2021, 10:07 AM [facility] PH: [phone]   FX: [phone] ACCREDITED BY THE AMERICAN ACADEMY OF SLEEP MEDICINE

## 2021-07-26 ENCOUNTER — Other Ambulatory Visit: Payer: Self-pay | Admitting: Physician Assistant

## 2021-07-26 DIAGNOSIS — I10 Essential (primary) hypertension: Secondary | ICD-10-CM

## 2021-07-26 DIAGNOSIS — G4730 Sleep apnea, unspecified: Secondary | ICD-10-CM

## 2021-10-14 ENCOUNTER — Other Ambulatory Visit: Payer: Self-pay | Admitting: Physician Assistant

## 2021-10-14 DIAGNOSIS — E559 Vitamin D deficiency, unspecified: Secondary | ICD-10-CM

## 2021-10-26 ENCOUNTER — Other Ambulatory Visit: Payer: Self-pay | Admitting: Physician Assistant

## 2021-10-26 DIAGNOSIS — E559 Vitamin D deficiency, unspecified: Secondary | ICD-10-CM

## 2021-10-27 ENCOUNTER — Other Ambulatory Visit: Payer: Self-pay | Admitting: Physician Assistant

## 2021-10-27 DIAGNOSIS — E559 Vitamin D deficiency, unspecified: Secondary | ICD-10-CM

## 2021-10-28 ENCOUNTER — Other Ambulatory Visit: Payer: Self-pay | Admitting: Physician Assistant

## 2021-10-28 DIAGNOSIS — E559 Vitamin D deficiency, unspecified: Secondary | ICD-10-CM

## 2021-10-28 DIAGNOSIS — I10 Essential (primary) hypertension: Secondary | ICD-10-CM

## 2021-10-28 DIAGNOSIS — I712 Thoracic aortic aneurysm, without rupture, unspecified: Secondary | ICD-10-CM

## 2021-10-31 ENCOUNTER — Telehealth: Payer: Self-pay | Admitting: Physician Assistant

## 2021-10-31 DIAGNOSIS — G4730 Sleep apnea, unspecified: Secondary | ICD-10-CM

## 2021-10-31 NOTE — Telephone Encounter (Signed)
Patient calling stating he needs new DME order for Cpap machine and supplies. Please advise? ?

## 2021-11-05 ENCOUNTER — Other Ambulatory Visit: Payer: Self-pay | Admitting: Physician Assistant

## 2021-11-05 DIAGNOSIS — I10 Essential (primary) hypertension: Secondary | ICD-10-CM

## 2021-11-07 NOTE — Addendum Note (Signed)
Addended by: Mickel Crow on: 11/07/2021 08:04 AM ? ? Modules accepted: Orders ? ?

## 2021-11-07 NOTE — Telephone Encounter (Signed)
DME order has been placed. AS, CMA ?

## 2021-11-24 ENCOUNTER — Ambulatory Visit: Payer: BC Managed Care – PPO | Admitting: Physician Assistant

## 2021-12-01 ENCOUNTER — Other Ambulatory Visit: Payer: Self-pay | Admitting: Physician Assistant

## 2021-12-01 DIAGNOSIS — E559 Vitamin D deficiency, unspecified: Secondary | ICD-10-CM

## 2021-12-14 ENCOUNTER — Ambulatory Visit (INDEPENDENT_AMBULATORY_CARE_PROVIDER_SITE_OTHER): Payer: PRIVATE HEALTH INSURANCE | Admitting: Physician Assistant

## 2021-12-14 ENCOUNTER — Encounter: Payer: Self-pay | Admitting: Physician Assistant

## 2021-12-14 VITALS — BP 120/82 | HR 56 | Temp 98.2°F | Ht 69.0 in | Wt 306.0 lb

## 2021-12-14 DIAGNOSIS — Z0000 Encounter for general adult medical examination without abnormal findings: Secondary | ICD-10-CM | POA: Diagnosis not present

## 2021-12-14 DIAGNOSIS — M1712 Unilateral primary osteoarthritis, left knee: Secondary | ICD-10-CM

## 2021-12-14 DIAGNOSIS — E785 Hyperlipidemia, unspecified: Secondary | ICD-10-CM

## 2021-12-14 DIAGNOSIS — Z130 Encounter for screening for diseases of the blood and blood-forming organs and certain disorders involving the immune mechanism: Secondary | ICD-10-CM

## 2021-12-14 DIAGNOSIS — Z1321 Encounter for screening for nutritional disorder: Secondary | ICD-10-CM

## 2021-12-14 DIAGNOSIS — R7303 Prediabetes: Secondary | ICD-10-CM

## 2021-12-14 DIAGNOSIS — G4700 Insomnia, unspecified: Secondary | ICD-10-CM

## 2021-12-14 DIAGNOSIS — E559 Vitamin D deficiency, unspecified: Secondary | ICD-10-CM

## 2021-12-14 DIAGNOSIS — Z23 Encounter for immunization: Secondary | ICD-10-CM

## 2021-12-14 DIAGNOSIS — Z1329 Encounter for screening for other suspected endocrine disorder: Secondary | ICD-10-CM

## 2021-12-14 DIAGNOSIS — Z13228 Encounter for screening for other metabolic disorders: Secondary | ICD-10-CM

## 2021-12-14 DIAGNOSIS — I10 Essential (primary) hypertension: Secondary | ICD-10-CM | POA: Diagnosis not present

## 2021-12-14 DIAGNOSIS — Z125 Encounter for screening for malignant neoplasm of prostate: Secondary | ICD-10-CM

## 2021-12-14 DIAGNOSIS — N529 Male erectile dysfunction, unspecified: Secondary | ICD-10-CM

## 2021-12-14 MED ORDER — SILDENAFIL CITRATE 25 MG PO TABS: 25.0000 mg | ORAL_TABLET | Freq: Every day | ORAL | 0 refills | Status: DC | PRN

## 2021-12-14 MED ORDER — MELOXICAM 7.5 MG PO TABS: 7.5000 mg | ORAL_TABLET | Freq: Every day | ORAL | 1 refills | Status: DC

## 2021-12-14 MED ORDER — TRAZODONE HCL 50 MG PO TABS: 25.0000 mg | ORAL_TABLET | Freq: Every evening | ORAL | 2 refills | Status: DC | PRN

## 2021-12-14 NOTE — Patient Instructions (Signed)

## 2021-12-14 NOTE — Progress Notes (Signed)
? ?Complete physical exam ? ? ?Patient: Keith Vance   DOB: 03/01/1961   61 y.o. Male  MRN: [id number] ?Visit Date: 12/14/2021 ? ? ?Chief Complaint  ?Patient presents with  ? Annual Exam  ? ?Subjective  ?  ?Keith Vance is a 61 y.o. male who presents today for a complete physical exam.  ?He reports working on weight loss, has resumed lean meats and reduced carbohydrates.  Stays active with outdoor activities.  He generally feels fairly well. He does not have additional problems to discuss today.  ? ? ? ?Past Medical History:  ?Diagnosis Date  ? Arthritis   ? Ascending aortic aneurysm (HCC)   ? a. 10/2015 CTA chest: 4.4cm aneurysmal dil of Asc Ao-->rec f/u in 1 yr.  ? Back pain   ? Chest pain 10/11/2018  ? Fatty liver   ? GERD (gastroesophageal reflux disease)   ? hx  ? Heart disease   ? History of kidney stones   ? Hyperlipidemia   ? Hypertension   ? Joint pain   ? Liver disease   ? Lower extremity edema   ? Obesity   ? Pneumonia   ? hx  ? Prediabetes   ? Rheumatoid arthritis (HCC)   ? SBO (small bowel obstruction) (HCC)   ? a. 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  ? Sleep apnea   ? cpap  ? ?Past Surgical History:  ?Procedure Laterality Date  ? HERNIA REPAIR Left   ? inguinal, and umbilical  ? JOINT REPLACEMENT    ? REVISION TOTAL KNEE ARTHROPLASTY Left 07/30/2017  ? LEFT TOTAL KNEE PARTIAL REVISION AND SYNOVECTOMY/notes 07/30/2017  ? TOTAL KNEE ARTHROPLASTY Left 04/24/2016  ? Procedure: LEFT TOTAL KNEE ARTHROPLASTY;  Surgeon: Durene Romans, MD;  Location: [facility];  Service: Orthopedics;  Laterality: Left;  ? TOTAL KNEE REVISION Left 07/30/2017  ? Procedure: LEFT TOTAL KNEE PARTIAL REVISION AND SYNOVECTOMY;  Surgeon: Gean Birchwood, MD;  Location: [facility];  Service: Orthopedics;  Laterality: Left;  ? ?Social History  ? ?Socioeconomic History  ? Marital status: Married  ?  Spouse name: Not on file  ? Number of children: Not on file  ? Years of education: Not on file  ? Highest education level: Not on file  ?Occupational History  ?  Occupation: Engineer, materials  ?Tobacco Use  ? Smoking status: Never  ? Smokeless tobacco: Never  ? Tobacco comments:  ?  off and on as a teenager - very little per patient   ?Vaping Use  ? Vaping Use: Never used  ?Substance and Sexual Activity  ? Alcohol use: No  ? Drug use: No  ? Sexual activity: Yes  ?Other Topics Concern  ? Not on file  ?Social History Narrative  ? Not on file  ? ?Social Determinants of Health  ? ?Financial Resource Strain: Not on file  ?Food Insecurity: Not on file  ?Transportation Needs: Not on file  ?Physical Activity: Not on file  ?Stress: Not on file  ?Social Connections: Not on file  ?Intimate Partner Violence: Not on file  ? ? ? ?Medications: ?Outpatient Medications Prior to Visit  ?Medication Sig  ? amLODipine (NORVASC) 10 MG tablet Take 1 tablet (10 mg total) by mouth daily.  ? aspirin EC 81 MG EC tablet Take 1 tablet (81 mg total) by mouth daily.  ? atorvastatin (LIPITOR) 40 MG tablet Take 1 tablet (40 mg total) by mouth at bedtime.  ? carvedilol (COREG) 12.5 MG tablet TAKE 1 TABLET(12.5 MG) BY MOUTH TWICE  DAILY WITH A MEAL  ? losartan-hydrochlorothiazide (HYZAAR) 100-12.5 MG tablet TAKE 1 TABLET BY MOUTH DAILY  ? Multiple Vitamins-Minerals (MULTIVITAMIN WITH MINERALS) tablet Take 1 tablet by mouth daily.  ? Omega-3 Fatty Acids (FISH OIL) 1200 MG CAPS Take 1,200 mg by mouth daily.  ? Vitamin D, Ergocalciferol, (DRISDOL) 1.25 MG (50000 UNIT) CAPS capsule TAKE 1 CAPSULE BY MOUTH WEEKLY  ? [DISCONTINUED] Docusate Calcium (STOOL SOFTENER PO) Take by mouth.  ? [DISCONTINUED] Evolocumab (REPATHA SURECLICK) 140 MG/ML SOAJ Inject 140 mg into the skin every 14 (fourteen) days.  ? ?No facility-administered medications prior to visit.  ? ? ?Review of Systems ? ?Last CBC ?Lab Results  ?Component Value Date  ? WBC 9.4 02/16/2021  ? HGB 15.2 02/16/2021  ? HCT 42.8 02/16/2021  ? MCV 90.9 02/16/2021  ? MCH 32.3 02/16/2021  ? RDW 13.2 02/16/2021  ? PLT 116 (L) 02/16/2021  ? ?Last metabolic panel ?Lab  Results  ?Component Value Date  ? GLUCOSE 133 (H) 02/16/2021  ? NA 133 (L) 02/16/2021  ? K 3.3 (L) 02/16/2021  ? CL 98 02/16/2021  ? CO2 22 02/16/2021  ? BUN 22 (H) 02/16/2021  ? CREATININE 1.73 (H) 02/16/2021  ? GFRNONAA 45 (L) 02/16/2021  ? CALCIUM 9.0 02/16/2021  ? PHOS 3.8 10/12/2018  ? PROT 7.2 02/16/2021  ? ALBUMIN 4.1 02/16/2021  ? LABGLOB 3.0 02/08/2021  ? AGRATIO 1.5 02/08/2021  ? BILITOT 1.1 02/16/2021  ? ALKPHOS 74 02/16/2021  ? AST 24 02/16/2021  ? ALT 35 02/16/2021  ? ANIONGAP 13 02/16/2021  ? ?Last lipids ?Lab Results  ?Component Value Date  ? CHOL 171 02/11/2021  ? HDL 60 02/11/2021  ? LDLCALC 93 02/11/2021  ? TRIG 102 02/11/2021  ? CHOLHDL 2.9 02/11/2021  ? ?Last hemoglobin A1c ?Lab Results  ?Component Value Date  ? HGBA1C 5.7 (H) 02/08/2021  ? ?Last thyroid functions ?Lab Results  ?Component Value Date  ? TSH 2.460 02/08/2021  ? ?Last vitamin D ?Lab Results  ?Component Value Date  ? VD25OH 46.3 02/08/2021  ? ?  ? Objective  ? ?  ?BP 120/82   Pulse (!) 56   Temp 98.2 ?F (36.8 ?C)   Ht 5\' 9"  (1.753 m)   Wt (!) 306 lb (138.8 kg)   SpO2 98%   BMI 45.19 kg/m?  ?BP Readings from Last 3 Encounters:  ?12/14/21 120/82  ?03/30/21 136/80  ?03/09/21 134/76  ? ?Wt Readings from Last 3 Encounters:  ?12/14/21 (!) 306 lb (138.8 kg)  ?06/07/21 284 lb (128.8 kg)  ?03/30/21 271 lb (122.9 kg)  ? ? ?Physical Exam  ? ?General Appearance:     Alert, cooperative, in no acute distress, appears stated age  ?Head:    Normocephalic, without obvious abnormality, atraumatic  ?Eyes:    PERRL, conjunctiva/corneas clear, EOM's intact, fundi  ?  benign, both eyes       ?Ears:    Normal TM's and external ear canals, both ears  ?Nose:   Nares normal, septum midline, mucosa normal, no drainage ?  or sinus tenderness  ?Throat:   Lips, mucosa, and tongue normal; teeth and gums normal  ?Neck:   Supple, symmetrical, trachea midline, no adenopathy;     ?  thyroid:  No enlargement/tenderness/nodules; no JVD  ?Back:     Symmetric, no  curvature, ROM normal, no CVA tenderness  ?Lungs:     Clear to auscultation bilaterally, respirations unlabored  ?Chest wall:    No tenderness or deformity  ?Heart:  Bradycardic. Normal rhythm. No murmurs, rubs, or gallops.  S1 and S2 normal  ?Abdomen:     Soft, non-tender, bowel sounds active all four quadrants,  ?  no masses, no organomegaly  ?Genitalia:    deferred  ?Rectal:    deferred  ?Extremities:   All extremities are intact. No cyanosis or edema  ?Pulses:   2+ and symmetric all extremities  ?Skin:   Skin color, texture, turgor normal, no rashes or lesions  ?Lymph nodes:   Cervical and supraclavicular nodes normal  ?Neurologic:   CNII-XII grossly intact.   ? ? ? ?Last depression screening scores ? ?  12/14/2021  ? 11:05 AM 02/23/2021  ?  3:22 PM 02/08/2021  ?  7:36 AM  ?PHQ 2/9 Scores  ?PHQ - 2 Score 0 0 1  ?PHQ- 9 Score 3 2 4   ? ?Last fall risk screening ? ?  12/14/2021  ? 11:05 AM  ?Fall Risk   ?Falls in the past year? 1  ?Number falls in past yr: 1  ?Injury with Fall? 0  ?Risk for fall due to : History of fall(s);Impaired mobility  ?Follow up Falls evaluation completed  ? ? ? ?No results found for any visits on 12/14/21. ? Assessment & Plan  ?  ?Routine Health Maintenance and Physical Exam ? ?Exercise Activities and Dietary recommendations ?-Discussed heart healthy diet low in fat and carbohydrates.  ? ?Immunization History  ?Administered Date(s) Administered  ? Hepatitis B, adult 09/09/2013, 10/10/2013, 03/10/2014  ? Influenza Inj Mdck Quad Pf 05/17/2018  ? Influenza,inj,Quad PF,6+ Mos 06/08/2017, 05/29/2019  ? Influenza-Unspecified 05/25/2021  ? PFIZER(Purple Top)SARS-COV-2 Vaccination 09/18/2019, 10/09/2019  ? Pneumococcal Polysaccharide-23 02/28/2015  ? Tdap 12/14/2021  ? ? ?Health Maintenance  ?Topic Date Due  ? COLONOSCOPY (Pts 45-23yrs Insurance coverage will need to be confirmed)  Never done  ? Zoster Vaccines- Shingrix (1 of 2) Never done  ? COVID-19 Vaccine (3 - Booster for Pfizer series)  12/04/2019  ? INFLUENZA VACCINE  03/28/2022  ? TETANUS/TDAP  12/15/2031  ? Hepatitis C Screening  Completed  ? HIV Screening  Completed  ? HPV VACCINES  Aged Out  ? ? ?Discussed health benefits of physical activity, and encoura

## 2021-12-15 LAB — CBC WITH DIFFERENTIAL/PLATELET
Basophils Absolute: 0.1 10*3/uL (ref 0.0–0.2)
Basos: 1 %
EOS (ABSOLUTE): 0.4 10*3/uL (ref 0.0–0.4)
Eos: 5 %
Hematocrit: 46.2 % (ref 37.5–51.0)
Hemoglobin: 15.6 g/dL (ref 13.0–17.7)
Immature Grans (Abs): 0 10*3/uL (ref 0.0–0.1)
Immature Granulocytes: 0 %
Lymphocytes Absolute: 2.6 10*3/uL (ref 0.7–3.1)
Lymphs: 35 %
MCH: 31.7 pg (ref 26.6–33.0)
MCHC: 33.8 g/dL (ref 31.5–35.7)
MCV: 94 fL (ref 79–97)
Monocytes Absolute: 0.6 10*3/uL (ref 0.1–0.9)
Monocytes: 8 %
Neutrophils Absolute: 3.8 10*3/uL (ref 1.4–7.0)
Neutrophils: 51 %
Platelets: 159 10*3/uL (ref 150–450)
RBC: 4.92 x10E6/uL (ref 4.14–5.80)
RDW: 13.1 % (ref 11.6–15.4)
WBC: 7.4 10*3/uL (ref 3.4–10.8)

## 2021-12-15 LAB — LIPID PANEL
Chol/HDL Ratio: 3.1 ratio (ref 0.0–5.0)
Cholesterol, Total: 178 mg/dL (ref 100–199)
HDL: 58 mg/dL (ref >39)
LDL Chol Calc (NIH): 96 mg/dL (ref 0–99)
Triglycerides: 137 mg/dL (ref 0–149)
VLDL Cholesterol Cal: 24 mg/dL (ref 5–40)

## 2021-12-15 LAB — COMPREHENSIVE METABOLIC PANEL
ALT: 83 IU/L — ABNORMAL HIGH (ref 0–44)
AST: 61 IU/L — ABNORMAL HIGH (ref 0–40)
Albumin/Globulin Ratio: 1.7 (ref 1.2–2.2)
Albumin: 4.5 g/dL (ref 3.8–4.9)
Alkaline Phosphatase: 78 IU/L (ref 44–121)
BUN/Creatinine Ratio: 20 (ref 10–24)
BUN: 21 mg/dL (ref 8–27)
Bilirubin Total: 0.7 mg/dL (ref 0.0–1.2)
CO2: 21 mmol/L (ref 20–29)
Calcium: 9.7 mg/dL (ref 8.6–10.2)
Chloride: 102 mmol/L (ref 96–106)
Creatinine, Ser: 1.05 mg/dL (ref 0.76–1.27)
Globulin, Total: 2.7 g/dL (ref 1.5–4.5)
Glucose: 97 mg/dL (ref 70–99)
Potassium: 4.2 mmol/L (ref 3.5–5.2)
Sodium: 142 mmol/L (ref 134–144)
Total Protein: 7.2 g/dL (ref 6.0–8.5)
eGFR: 81 mL/min/{1.73_m2} (ref >59)

## 2021-12-15 LAB — HEMOGLOBIN A1C
Est. average glucose Bld gHb Est-mCnc: 120 mg/dL
Hgb A1c MFr Bld: 5.8 % — ABNORMAL HIGH (ref 4.8–5.6)

## 2021-12-15 LAB — VITAMIN D 25 HYDROXY (VIT D DEFICIENCY, FRACTURES): Vit D, 25-Hydroxy: 36.6 ng/mL (ref 30.0–100.0)

## 2021-12-15 LAB — TSH: TSH: 2.05 u[IU]/mL (ref 0.450–4.500)

## 2021-12-15 LAB — PSA: Prostate Specific Ag, Serum: 0.5 ng/mL (ref 0.0–4.0)

## 2022-01-20 ENCOUNTER — Other Ambulatory Visit: Payer: Self-pay | Admitting: Cardiovascular Disease

## 2022-01-20 DIAGNOSIS — I10 Essential (primary) hypertension: Secondary | ICD-10-CM

## 2022-01-25 ENCOUNTER — Other Ambulatory Visit: Payer: Self-pay | Admitting: Physician Assistant

## 2022-01-25 DIAGNOSIS — N529 Male erectile dysfunction, unspecified: Secondary | ICD-10-CM

## 2022-02-22 ENCOUNTER — Ambulatory Visit (INDEPENDENT_AMBULATORY_CARE_PROVIDER_SITE_OTHER): Payer: PRIVATE HEALTH INSURANCE | Admitting: Nurse Practitioner

## 2022-02-22 ENCOUNTER — Encounter: Payer: Self-pay | Admitting: Nurse Practitioner

## 2022-02-22 VITALS — BP 119/74 | HR 56 | Temp 97.4°F | Ht 69.0 in | Wt 308.1 lb

## 2022-02-22 DIAGNOSIS — J0100 Acute maxillary sinusitis, unspecified: Secondary | ICD-10-CM | POA: Diagnosis not present

## 2022-02-22 DIAGNOSIS — J300 Vasomotor rhinitis: Secondary | ICD-10-CM | POA: Diagnosis not present

## 2022-02-22 MED ORDER — AZITHROMYCIN 250 MG PO TABS: ORAL_TABLET | ORAL | 0 refills | Status: DC

## 2022-02-22 MED ORDER — FLUTICASONE PROPIONATE 50 MCG/ACT NA SUSP: 2.0000 | Freq: Every day | NASAL | 6 refills | Status: DC

## 2022-02-22 NOTE — Progress Notes (Signed)
Established patient visit   Patient: Keith Vance   DOB: 26-Jan-1961   61 y.o. Male  MRN: [id number] Visit Date: 02/22/2022  Chief Complaint  Patient presents with   Sinus Problem   Subjective    HPI  Acute visit -left maxillary sinus pain  -headache. -using nasal wash every morning and every night -denies fever.  -symptoms present for several days and unchanged.  -denies nausea and vomiting.  -moderate nasal drainage. Yellow in color.   Medications: Outpatient Medications Prior to Visit  Medication Sig   amLODipine (NORVASC) 10 MG tablet TAKE 1 TABLET(10 MG) BY MOUTH DAILY   aspirin EC 81 MG EC tablet Take 1 tablet (81 mg total) by mouth daily.   atorvastatin (LIPITOR) 40 MG tablet Take 1 tablet (40 mg total) by mouth at bedtime.   carvedilol (COREG) 12.5 MG tablet TAKE 1 TABLET(12.5 MG) BY MOUTH TWICE DAILY WITH A MEAL   losartan-hydrochlorothiazide (HYZAAR) 100-12.5 MG tablet TAKE 1 TABLET BY MOUTH DAILY   meloxicam (MOBIC) 7.5 MG tablet Take 1 tablet (7.5 mg total) by mouth daily.   Multiple Vitamins-Minerals (MULTIVITAMIN WITH MINERALS) tablet Take 1 tablet by mouth daily.   Omega-3 Fatty Acids (FISH OIL) 1200 MG CAPS Take 1,200 mg by mouth daily.   sildenafil (VIAGRA) 25 MG tablet TAKE 1 TABLET(25 MG) BY MOUTH DAILY AS NEEDED FOR ERECTILE DYSFUNCTION   traZODone (DESYREL) 50 MG tablet Take 0.5-1 tablets (25-50 mg total) by mouth at bedtime as needed for sleep.   Vitamin D, Ergocalciferol, (DRISDOL) 1.25 MG (50000 UNIT) CAPS capsule TAKE 1 CAPSULE BY MOUTH WEEKLY   No facility-administered medications prior to visit.    Review of Systems  Constitutional:  Positive for fatigue. Negative for activity change, chills and fever.  HENT:  Positive for congestion, postnasal drip, rhinorrhea, sinus pressure, sinus pain and sneezing. Negative for sore throat.   Eyes: Negative.   Respiratory:  Negative for cough, shortness of breath and wheezing.   Cardiovascular:  Negative for  chest pain and palpitations.  Gastrointestinal:  Negative for constipation, diarrhea, nausea and vomiting.  Endocrine: Negative for cold intolerance, heat intolerance, polydipsia and polyuria.  Genitourinary:  Negative for dysuria, frequency and urgency.  Musculoskeletal:  Negative for back pain and myalgias.  Skin:  Negative for rash.  Allergic/Immunologic: Positive for environmental allergies.  Neurological:  Positive for headaches. Negative for dizziness and weakness.  Psychiatric/Behavioral:  The patient is not nervous/anxious.      Objective     Today's Vitals   02/22/22 1450  BP: 119/74  Pulse: (!) 56  Temp: (!) 97.4 F (36.3 C)  SpO2: 94%  Weight: (!) 308 lb 1.9 oz (139.8 kg)  Height: 5\' 9"  (1.753 m)   Body mass index is 45.5 kg/m.   Physical Exam Vitals and nursing note reviewed.  Constitutional:      Appearance: Normal appearance. He is well-developed. He is ill-appearing.  HENT:     Head: Normocephalic and atraumatic.     Right Ear: Tympanic membrane is bulging.     Left Ear: Tympanic membrane is bulging.     Nose: Congestion present.     Right Sinus: Maxillary sinus tenderness and frontal sinus tenderness present.     Left Sinus: Maxillary sinus tenderness and frontal sinus tenderness present.     Mouth/Throat:     Pharynx: Posterior oropharyngeal erythema present.  Eyes:     Extraocular Movements: Extraocular movements intact.     Conjunctiva/sclera: Conjunctivae normal.  Pupils: Pupils are equal, round, and reactive to light.  Cardiovascular:     Rate and Rhythm: Normal rate and regular rhythm.     Pulses: Normal pulses.     Heart sounds: Normal heart sounds.  Pulmonary:     Effort: Pulmonary effort is normal.     Breath sounds: Normal breath sounds.  Abdominal:     Palpations: Abdomen is soft.  Musculoskeletal:        General: Normal range of motion.     Cervical back: Normal range of motion and neck supple.  Lymphadenopathy:     Cervical:  Cervical adenopathy present.  Skin:    General: Skin is warm and dry.     Capillary Refill: Capillary refill takes less than 2 seconds.  Neurological:     General: No focal deficit present.     Mental Status: He is alert and oriented to person, place, and time.  Psychiatric:        Mood and Affect: Mood normal.        Behavior: Behavior normal.        Thought Content: Thought content normal.        Judgment: Judgment normal.      Assessment & Plan     1. Acute non-recurrent maxillary sinusitis Start Z-Pak.  Take as directed for 5 days. Rest and increase fluids. Continue using OTC medication to control symptoms.   - azithromycin (ZITHROMAX) 250 MG tablet; z-pack - take as directed for 5 days  Dispense: 6 tablet; Refill: 0  2. Vasomotor rhinitis Use Flonase nasal spray.  Use 2 sprays in both nostrils daily. - fluticasone (FLONASE) 50 MCG/ACT nasal spray; Place 2 sprays into both nostrils daily.  Dispense: 16 g; Refill: 6   Return for prn worsening or persistent symptoms.        Carlean Jews, NP  [facility] [phone] (phone) [phone] (fax)  [facility]

## 2022-02-26 DIAGNOSIS — J0100 Acute maxillary sinusitis, unspecified: Secondary | ICD-10-CM | POA: Insufficient documentation

## 2022-02-26 DIAGNOSIS — J300 Vasomotor rhinitis: Secondary | ICD-10-CM | POA: Insufficient documentation

## 2022-03-03 ENCOUNTER — Other Ambulatory Visit: Payer: Self-pay | Admitting: Physician Assistant

## 2022-03-03 DIAGNOSIS — I10 Essential (primary) hypertension: Secondary | ICD-10-CM

## 2022-03-07 ENCOUNTER — Other Ambulatory Visit: Payer: Self-pay | Admitting: Physician Assistant

## 2022-03-07 ENCOUNTER — Other Ambulatory Visit: Payer: Self-pay | Admitting: Cardiovascular Disease

## 2022-03-07 DIAGNOSIS — I10 Essential (primary) hypertension: Secondary | ICD-10-CM

## 2022-03-07 DIAGNOSIS — I712 Thoracic aortic aneurysm, without rupture, unspecified: Secondary | ICD-10-CM

## 2022-03-07 DIAGNOSIS — E785 Hyperlipidemia, unspecified: Secondary | ICD-10-CM

## 2022-03-08 ENCOUNTER — Other Ambulatory Visit: Payer: Self-pay | Admitting: Physician Assistant

## 2022-03-08 ENCOUNTER — Other Ambulatory Visit: Payer: Self-pay | Admitting: Cardiovascular Disease

## 2022-03-08 DIAGNOSIS — I10 Essential (primary) hypertension: Secondary | ICD-10-CM

## 2022-03-08 DIAGNOSIS — E785 Hyperlipidemia, unspecified: Secondary | ICD-10-CM

## 2022-03-08 DIAGNOSIS — I712 Thoracic aortic aneurysm, without rupture, unspecified: Secondary | ICD-10-CM

## 2022-04-05 ENCOUNTER — Encounter (INDEPENDENT_AMBULATORY_CARE_PROVIDER_SITE_OTHER): Payer: Self-pay

## 2022-04-06 ENCOUNTER — Other Ambulatory Visit: Payer: Self-pay | Admitting: Cardiovascular Disease

## 2022-04-06 DIAGNOSIS — E785 Hyperlipidemia, unspecified: Secondary | ICD-10-CM

## 2022-04-07 ENCOUNTER — Other Ambulatory Visit: Payer: Self-pay | Admitting: Cardiovascular Disease

## 2022-04-07 DIAGNOSIS — E785 Hyperlipidemia, unspecified: Secondary | ICD-10-CM

## 2022-04-11 ENCOUNTER — Other Ambulatory Visit: Payer: Self-pay | Admitting: Physician Assistant

## 2022-04-11 DIAGNOSIS — N529 Male erectile dysfunction, unspecified: Secondary | ICD-10-CM

## 2022-04-25 ENCOUNTER — Other Ambulatory Visit: Payer: Self-pay | Admitting: Physician Assistant

## 2022-04-25 DIAGNOSIS — M1712 Unilateral primary osteoarthritis, left knee: Secondary | ICD-10-CM

## 2022-05-30 ENCOUNTER — Other Ambulatory Visit: Payer: Self-pay | Admitting: Cardiovascular Disease

## 2022-05-30 ENCOUNTER — Other Ambulatory Visit: Payer: Self-pay | Admitting: Physician Assistant

## 2022-05-30 DIAGNOSIS — I10 Essential (primary) hypertension: Secondary | ICD-10-CM

## 2022-05-30 DIAGNOSIS — E785 Hyperlipidemia, unspecified: Secondary | ICD-10-CM

## 2022-05-30 DIAGNOSIS — I712 Thoracic aortic aneurysm, without rupture, unspecified: Secondary | ICD-10-CM

## 2022-06-25 ENCOUNTER — Other Ambulatory Visit: Payer: Self-pay | Admitting: Physician Assistant

## 2022-06-25 DIAGNOSIS — M1712 Unilateral primary osteoarthritis, left knee: Secondary | ICD-10-CM

## 2022-08-24 ENCOUNTER — Other Ambulatory Visit: Payer: Self-pay | Admitting: Cardiovascular Disease

## 2022-08-24 DIAGNOSIS — E785 Hyperlipidemia, unspecified: Secondary | ICD-10-CM

## 2022-10-02 ENCOUNTER — Telehealth: Payer: Self-pay | Admitting: *Deleted

## 2022-10-02 DIAGNOSIS — I10 Essential (primary) hypertension: Secondary | ICD-10-CM

## 2022-10-02 MED ORDER — LOSARTAN POTASSIUM-HCTZ 100-12.5 MG PO TABS: 1.0000 | ORAL_TABLET | Freq: Every day | ORAL | 0 refills | Status: DC

## 2022-10-02 NOTE — Telephone Encounter (Signed)
30 day supply sent

## 2022-10-02 NOTE — Telephone Encounter (Signed)
Pt is on his way out of town and will need the below medication refilled enough to get to his next appointment.          losartan-hydrochlorothiazide (HYZAAR) 100-12.5 MG tablet    [facility] [place], [address] [place]    LOV:02/22/22 ROV:11/09/22

## 2022-10-05 ENCOUNTER — Other Ambulatory Visit: Payer: Self-pay

## 2022-10-05 DIAGNOSIS — I712 Thoracic aortic aneurysm, without rupture, unspecified: Secondary | ICD-10-CM

## 2022-10-05 DIAGNOSIS — I10 Essential (primary) hypertension: Secondary | ICD-10-CM

## 2022-10-05 MED ORDER — CARVEDILOL 12.5 MG PO TABS: ORAL_TABLET | ORAL | 0 refills | Status: DC

## 2022-11-06 ENCOUNTER — Other Ambulatory Visit: Payer: Self-pay | Admitting: Family Medicine

## 2022-11-06 ENCOUNTER — Other Ambulatory Visit: Payer: Self-pay

## 2022-11-06 DIAGNOSIS — I10 Essential (primary) hypertension: Secondary | ICD-10-CM

## 2022-11-06 MED ORDER — LOSARTAN POTASSIUM-HCTZ 100-12.5 MG PO TABS: 1.0000 | ORAL_TABLET | Freq: Every day | ORAL | 0 refills | Status: DC

## 2022-11-09 ENCOUNTER — Ambulatory Visit (INDEPENDENT_AMBULATORY_CARE_PROVIDER_SITE_OTHER): Payer: PRIVATE HEALTH INSURANCE | Admitting: Family Medicine

## 2022-11-09 ENCOUNTER — Encounter: Payer: Self-pay | Admitting: Family Medicine

## 2022-11-09 VITALS — BP 162/98 | HR 62 | Resp 18 | Ht 69.0 in | Wt 306.0 lb

## 2022-11-09 DIAGNOSIS — R7303 Prediabetes: Secondary | ICD-10-CM | POA: Diagnosis not present

## 2022-11-09 DIAGNOSIS — M1712 Unilateral primary osteoarthritis, left knee: Secondary | ICD-10-CM

## 2022-11-09 DIAGNOSIS — I10 Essential (primary) hypertension: Secondary | ICD-10-CM

## 2022-11-09 DIAGNOSIS — N529 Male erectile dysfunction, unspecified: Secondary | ICD-10-CM

## 2022-11-09 DIAGNOSIS — E785 Hyperlipidemia, unspecified: Secondary | ICD-10-CM | POA: Diagnosis not present

## 2022-11-09 DIAGNOSIS — G4700 Insomnia, unspecified: Secondary | ICD-10-CM

## 2022-11-09 DIAGNOSIS — G4733 Obstructive sleep apnea (adult) (pediatric): Secondary | ICD-10-CM

## 2022-11-09 MED ORDER — AMLODIPINE BESYLATE 10 MG PO TABS: ORAL_TABLET | ORAL | 3 refills | Status: DC

## 2022-11-09 MED ORDER — CARVEDILOL 12.5 MG PO TABS: ORAL_TABLET | ORAL | 0 refills | Status: DC

## 2022-11-09 MED ORDER — MELOXICAM 7.5 MG PO TABS: ORAL_TABLET | ORAL | 1 refills | Status: DC

## 2022-11-09 MED ORDER — SILDENAFIL CITRATE 25 MG PO TABS: ORAL_TABLET | ORAL | 0 refills | Status: DC

## 2022-11-09 MED ORDER — LOSARTAN POTASSIUM-HCTZ 100-12.5 MG PO TABS: 1.0000 | ORAL_TABLET | Freq: Every day | ORAL | 0 refills | Status: DC

## 2022-11-09 MED ORDER — TRAZODONE HCL 50 MG PO TABS: 25.0000 mg | ORAL_TABLET | Freq: Every evening | ORAL | 2 refills | Status: DC | PRN

## 2022-11-09 MED ORDER — ATORVASTATIN CALCIUM 40 MG PO TABS: 40.0000 mg | ORAL_TABLET | Freq: Every day | ORAL | 0 refills | Status: DC

## 2022-11-09 NOTE — Progress Notes (Signed)
Established Patient Office Visit  Subjective   Patient ID: Keith Vance, male    DOB: [birth year]  Age: 62 y.o. MRN: [id number]  Chief Complaint  Patient presents with   Medical Management of Chronic Issues   Hyperlipidemia   Hypertension   Insomnia         HPI Keith Vance is a 62 y.o. male presenting today for follow up of hypertension, hyperlipidemia, insomnia. Hypertension: Patient here for follow-up of elevated blood pressure.  He works 11-hour days 7 days a week which keeps him very busy and makes it difficult to follow in a consistent exercise routine at home.  Pt denies chest pain, SOB, dizziness, edema, syncope, fatigue or heart palpitations. Taking amlodipine 10 mg, losartan-HCTZ, carvedilol, reports good compliance with treatment. Denies side effects.  He did forget to take his blood pressure medication today and was not surprised by the high reading in the office.  He does take blood pressure at home and systolic has been running around 122-132. Hyperlipidemia: tolerating atorvastatin well with no myalgias or significant side effects.  The 10-year ASCVD risk score (Arnett DK, et al., 2019) is: 20.2% Insomnia: Previously managing with trazodone 50 mg has not needed to take it in quite some time.  His new CPAP machine has been the most helpful in helping him to get a good night sleep.  With his new CPAP, he is able to get at least 5 hours of deep uninterrupted sleep which was not a possibility before.  ROS Negative unless otherwise noted in HPI   Objective:     BP (!) 162/98 (BP Location: Left Arm, Patient Position: Sitting, Cuff Size: Large)   Pulse 62   Resp 18   Ht '5\' 9"'$  (1.753 m)   Wt (!) 306 lb (138.8 kg)   SpO2 96%   BMI 45.19 kg/m   Physical Exam Constitutional:      General: He is not in acute distress.    Appearance: Normal appearance.  HENT:     Head: Normocephalic and atraumatic.  Eyes:     Extraocular Movements: Extraocular movements intact.      Conjunctiva/sclera: Conjunctivae normal.  Cardiovascular:     Rate and Rhythm: Normal rate and regular rhythm.     Pulses: Normal pulses.     Heart sounds: Normal heart sounds.  Pulmonary:     Effort: Pulmonary effort is normal.     Breath sounds: Normal breath sounds.  Skin:    General: Skin is warm and dry.  Neurological:     Mental Status: He is alert and oriented to person, place, and time.  Psychiatric:        Mood and Affect: Mood normal.     Assessment & Plan:  Essential hypertension Assessment & Plan: Stable if blood pressure is truly consistently [id number] systolic, but was elevated in clinic today.  We discussed that since he is not fasting today and will be coming back next week for labs, we will do a nurse visit to check his blood pressure to get a better picture of how well-controlled it is when he does take his medication.  Continue losartan-HCTZ 100-12.5 mg daily, amlodipine 10 mg daily, carvedilol 12.5 mg daily.  Ordering CBC and CMP for medication monitoring today.  Patient verbalized understanding and will come back next week for fasting lab work and a blood pressure check.  Orders: -     Comprehensive metabolic panel; Future -     CBC with Differential/Platelet; Future -  amLODIPine Besylate; TAKE 1 TABLET(10 MG) BY MOUTH DAILY  Dispense: 90 tablet; Refill: 3 -     Losartan Potassium-HCTZ; Take 1 tablet by mouth daily.  Dispense: 30 tablet; Refill: 0 -     Carvedilol; TAKE 1 TABLET(12.5 MG) BY MOUTH TWICE DAILY WITH A MEAL  Dispense: 180 tablet; Refill: 0  Dyslipidemia, goal LDL below 70 Assessment & Plan: Continue atorvastatin 40 mg daily.  Patient has not had lipid panel checked in quite some time, ordered repeat lipid panel today.  Patient will return when fasting to have lab work done and we will adjust treatment as indicated by results.  Orders: -     Lipid panel; Future -     Atorvastatin Calcium; Take 1 tablet (40 mg total) by mouth daily.  Dispense: 90  tablet; Refill: 0  Pre-diabetes Assessment & Plan: A1c has not been checked since April 2023, ordered repeat A1c today.  Orders: -     Hemoglobin A1c; Future  Erectile dysfunction, unspecified erectile dysfunction type -     Sildenafil Citrate; TAKE 1 TABLET(25 MG) BY MOUTH DAILY AS NEEDED FOR ERECTILE DYSFUNCTION  Dispense: 10 tablet; Refill: 0  Insomnia, unspecified type -     traZODone HCl; Take 0.5-1 tablets (25-50 mg total) by mouth at bedtime as needed for sleep.  Dispense: 30 tablet; Refill: 2  Primary osteoarthritis of left knee -     Meloxicam; TAKE 1 TABLET(7.5 MG) BY MOUTH DAILY  Dispense: 30 tablet; Refill: 1  Obstructive sleep apnea syndrome Assessment & Plan: Patient has a history of obstructive sleep apnea and is currently using his CPAP machine.  His CPAP fits well and is necessary to help him sleep.  With his CPAP machine, he gets at least 5 hours of continuous uninterrupted deep sleep even without taking trazodone.  Recommend continuing nightly CPAP use and will submit paperwork to [place] as requested.   Return in about 3 months (around 02/09/2023) for annual physical, fasting blood work 1 week before.    Keith Harman, PA

## 2022-11-09 NOTE — Assessment & Plan Note (Signed)
A1c has not been checked since April 2023, ordered repeat A1c today.

## 2022-11-09 NOTE — Patient Instructions (Signed)
Come back next week to have your fasting blood work done and we will also check your blood pressure at that time.

## 2022-11-09 NOTE — Assessment & Plan Note (Signed)
Continue atorvastatin 40 mg daily.  Patient has not had lipid panel checked in quite some time, ordered repeat lipid panel today.  Patient will return when fasting to have lab work done and we will adjust treatment as indicated by results.

## 2022-11-09 NOTE — Assessment & Plan Note (Addendum)
Stable if blood pressure is truly consistently [id number] systolic, but was elevated in clinic today.  We discussed that since he is not fasting today and will be coming back next week for labs, we will do a nurse visit to check his blood pressure to get a better picture of how well-controlled it is when he does take his medication.  Continue losartan-HCTZ 100-12.5 mg daily, amlodipine 10 mg daily, carvedilol 12.5 mg daily.  Ordering CBC and CMP for medication monitoring today.  Patient verbalized understanding and will come back next week for fasting lab work and a blood pressure check.

## 2022-11-09 NOTE — Assessment & Plan Note (Signed)
Patient has a history of obstructive sleep apnea and is currently using his CPAP machine.  His CPAP fits well and is necessary to help him sleep.  With his CPAP machine, he gets at least 5 hours of continuous uninterrupted deep sleep even without taking trazodone.  Recommend continuing nightly CPAP use and will submit paperwork to [place] as requested.

## 2022-11-17 ENCOUNTER — Other Ambulatory Visit: Payer: PRIVATE HEALTH INSURANCE

## 2022-11-17 DIAGNOSIS — I10 Essential (primary) hypertension: Secondary | ICD-10-CM

## 2022-11-17 DIAGNOSIS — E785 Hyperlipidemia, unspecified: Secondary | ICD-10-CM

## 2022-11-17 DIAGNOSIS — R7303 Prediabetes: Secondary | ICD-10-CM

## 2022-11-18 LAB — COMPREHENSIVE METABOLIC PANEL
ALT: 80 IU/L — ABNORMAL HIGH (ref 0–44)
AST: 67 IU/L — ABNORMAL HIGH (ref 0–40)
Albumin/Globulin Ratio: 1.4 (ref 1.2–2.2)
Albumin: 4.3 g/dL (ref 3.9–4.9)
Alkaline Phosphatase: 135 IU/L — ABNORMAL HIGH (ref 44–121)
BUN/Creatinine Ratio: 16 (ref 10–24)
BUN: 19 mg/dL (ref 8–27)
Bilirubin Total: 1 mg/dL (ref 0.0–1.2)
CO2: 25 mmol/L (ref 20–29)
Calcium: 9.4 mg/dL (ref 8.6–10.2)
Chloride: 97 mmol/L (ref 96–106)
Creatinine, Ser: 1.18 mg/dL (ref 0.76–1.27)
Globulin, Total: 3.1 g/dL (ref 1.5–4.5)
Glucose: 112 mg/dL — ABNORMAL HIGH (ref 70–99)
Potassium: 4.2 mmol/L (ref 3.5–5.2)
Sodium: 137 mmol/L (ref 134–144)
Total Protein: 7.4 g/dL (ref 6.0–8.5)
eGFR: 70 mL/min/{1.73_m2} (ref >59)

## 2022-11-18 LAB — CBC WITH DIFFERENTIAL/PLATELET
Basophils Absolute: 0.1 10*3/uL (ref 0.0–0.2)
Basos: 1 %
EOS (ABSOLUTE): 0.5 10*3/uL — ABNORMAL HIGH (ref 0.0–0.4)
Eos: 7 %
Hematocrit: 45.7 % (ref 37.5–51.0)
Hemoglobin: 15.2 g/dL (ref 13.0–17.7)
Immature Grans (Abs): 0 10*3/uL (ref 0.0–0.1)
Immature Granulocytes: 0 %
Lymphocytes Absolute: 2.4 10*3/uL (ref 0.7–3.1)
Lymphs: 32 %
MCH: 31.3 pg (ref 26.6–33.0)
MCHC: 33.3 g/dL (ref 31.5–35.7)
MCV: 94 fL (ref 79–97)
Monocytes Absolute: 0.7 10*3/uL (ref 0.1–0.9)
Monocytes: 9 %
Neutrophils Absolute: 3.8 10*3/uL (ref 1.4–7.0)
Neutrophils: 51 %
Platelets: 163 10*3/uL (ref 150–450)
RBC: 4.85 x10E6/uL (ref 4.14–5.80)
RDW: 13 % (ref 11.6–15.4)
WBC: 7.5 10*3/uL (ref 3.4–10.8)

## 2022-11-18 LAB — LIPID PANEL
Chol/HDL Ratio: 3.1 ratio (ref 0.0–5.0)
Cholesterol, Total: 157 mg/dL (ref 100–199)
HDL: 51 mg/dL (ref >39)
LDL Chol Calc (NIH): 81 mg/dL (ref 0–99)
Triglycerides: 146 mg/dL (ref 0–149)
VLDL Cholesterol Cal: 25 mg/dL (ref 5–40)

## 2022-11-18 LAB — HEMOGLOBIN A1C
Est. average glucose Bld gHb Est-mCnc: 120 mg/dL
Hgb A1c MFr Bld: 5.8 % — ABNORMAL HIGH (ref 4.8–5.6)

## 2022-11-22 ENCOUNTER — Other Ambulatory Visit: Payer: Self-pay | Admitting: Cardiovascular Disease

## 2022-11-22 DIAGNOSIS — E785 Hyperlipidemia, unspecified: Secondary | ICD-10-CM

## 2022-12-22 ENCOUNTER — Other Ambulatory Visit: Payer: Self-pay | Admitting: Cardiovascular Disease

## 2022-12-22 DIAGNOSIS — I10 Essential (primary) hypertension: Secondary | ICD-10-CM

## 2023-01-10 ENCOUNTER — Other Ambulatory Visit: Payer: Self-pay | Admitting: Family Medicine

## 2023-01-10 DIAGNOSIS — I10 Essential (primary) hypertension: Secondary | ICD-10-CM

## 2023-01-10 DIAGNOSIS — R7303 Prediabetes: Secondary | ICD-10-CM

## 2023-01-10 DIAGNOSIS — Z0000 Encounter for general adult medical examination without abnormal findings: Secondary | ICD-10-CM

## 2023-01-21 ENCOUNTER — Other Ambulatory Visit: Payer: Self-pay | Admitting: Family Medicine

## 2023-01-21 DIAGNOSIS — M1712 Unilateral primary osteoarthritis, left knee: Secondary | ICD-10-CM

## 2023-01-27 ENCOUNTER — Other Ambulatory Visit: Payer: Self-pay | Admitting: Family Medicine

## 2023-01-27 DIAGNOSIS — I10 Essential (primary) hypertension: Secondary | ICD-10-CM

## 2023-02-05 ENCOUNTER — Other Ambulatory Visit: Payer: PRIVATE HEALTH INSURANCE

## 2023-02-05 DIAGNOSIS — I10 Essential (primary) hypertension: Secondary | ICD-10-CM

## 2023-02-05 DIAGNOSIS — R7303 Prediabetes: Secondary | ICD-10-CM

## 2023-02-05 DIAGNOSIS — Z0000 Encounter for general adult medical examination without abnormal findings: Secondary | ICD-10-CM

## 2023-02-06 ENCOUNTER — Other Ambulatory Visit: Payer: Self-pay | Admitting: Family Medicine

## 2023-02-06 DIAGNOSIS — E785 Hyperlipidemia, unspecified: Secondary | ICD-10-CM

## 2023-02-06 LAB — COMPREHENSIVE METABOLIC PANEL
ALT: 85 IU/L — ABNORMAL HIGH (ref 0–44)
AST: 77 IU/L — ABNORMAL HIGH (ref 0–40)
Albumin/Globulin Ratio: 1.6
Albumin: 4.5 g/dL (ref 3.9–4.9)
Alkaline Phosphatase: 148 IU/L — ABNORMAL HIGH (ref 44–121)
BUN/Creatinine Ratio: 17 (ref 10–24)
BUN: 17 mg/dL (ref 8–27)
Bilirubin Total: 0.9 mg/dL (ref 0.0–1.2)
CO2: 25 mmol/L (ref 20–29)
Calcium: 10 mg/dL (ref 8.6–10.2)
Chloride: 99 mmol/L (ref 96–106)
Creatinine, Ser: 0.99 mg/dL (ref 0.76–1.27)
Globulin, Total: 2.9 g/dL (ref 1.5–4.5)
Glucose: 108 mg/dL — ABNORMAL HIGH (ref 70–99)
Potassium: 4 mmol/L (ref 3.5–5.2)
Sodium: 139 mmol/L (ref 134–144)
Total Protein: 7.4 g/dL (ref 6.0–8.5)
eGFR: 87 mL/min/{1.73_m2} (ref >59)

## 2023-02-06 LAB — CBC WITH DIFFERENTIAL/PLATELET
Basophils Absolute: 0.1 10*3/uL (ref 0.0–0.2)
Basos: 1 %
EOS (ABSOLUTE): 0.3 10*3/uL (ref 0.0–0.4)
Eos: 4 %
Hematocrit: 47.5 % (ref 37.5–51.0)
Hemoglobin: 16.1 g/dL (ref 13.0–17.7)
Immature Grans (Abs): 0.1 10*3/uL (ref 0.0–0.1)
Immature Granulocytes: 1 %
Lymphocytes Absolute: 2.4 10*3/uL (ref 0.7–3.1)
Lymphs: 30 %
MCH: 31.6 pg (ref 26.6–33.0)
MCHC: 33.9 g/dL (ref 31.5–35.7)
MCV: 93 fL (ref 79–97)
Monocytes Absolute: 0.6 10*3/uL (ref 0.1–0.9)
Monocytes: 8 %
Neutrophils Absolute: 4.6 10*3/uL (ref 1.4–7.0)
Neutrophils: 56 %
Platelets: 150 10*3/uL (ref 150–450)
RBC: 5.09 x10E6/uL (ref 4.14–5.80)
RDW: 13.1 % (ref 11.6–15.4)
WBC: 8.1 10*3/uL (ref 3.4–10.8)

## 2023-02-06 LAB — HEMOGLOBIN A1C
Est. average glucose Bld gHb Est-mCnc: 128 mg/dL
Hgb A1c MFr Bld: 6.1 % — ABNORMAL HIGH (ref 4.8–5.6)

## 2023-02-06 LAB — TSH: TSH: 2.03 u[IU]/mL (ref 0.450–4.500)

## 2023-02-07 ENCOUNTER — Other Ambulatory Visit: Payer: Self-pay | Admitting: Family Medicine

## 2023-02-07 DIAGNOSIS — I10 Essential (primary) hypertension: Secondary | ICD-10-CM

## 2023-02-12 ENCOUNTER — Encounter: Payer: Self-pay | Admitting: Family Medicine

## 2023-02-12 ENCOUNTER — Ambulatory Visit (INDEPENDENT_AMBULATORY_CARE_PROVIDER_SITE_OTHER): Payer: PRIVATE HEALTH INSURANCE | Admitting: Family Medicine

## 2023-02-12 VITALS — BP 147/89 | HR 94 | Temp 97.9°F | Ht 69.0 in | Wt 311.8 lb

## 2023-02-12 DIAGNOSIS — R7303 Prediabetes: Secondary | ICD-10-CM

## 2023-02-12 DIAGNOSIS — G4733 Obstructive sleep apnea (adult) (pediatric): Secondary | ICD-10-CM

## 2023-02-12 DIAGNOSIS — G4709 Other insomnia: Secondary | ICD-10-CM | POA: Insufficient documentation

## 2023-02-12 DIAGNOSIS — Z0000 Encounter for general adult medical examination without abnormal findings: Secondary | ICD-10-CM | POA: Diagnosis not present

## 2023-02-12 DIAGNOSIS — E785 Hyperlipidemia, unspecified: Secondary | ICD-10-CM | POA: Diagnosis not present

## 2023-02-12 DIAGNOSIS — I10 Essential (primary) hypertension: Secondary | ICD-10-CM

## 2023-02-12 MED ORDER — ESZOPICLONE 1 MG PO TABS
1.0000 mg | ORAL_TABLET | Freq: Every evening | ORAL | 1 refills | Status: DC | PRN
Start: 2023-02-12 — End: 2023-09-17

## 2023-02-12 NOTE — Assessment & Plan Note (Signed)
Continue using CPAP nightly 

## 2023-02-12 NOTE — Assessment & Plan Note (Signed)
BP goal <130/80.  Blood pressure above goal 142/88, on repeat 147/89.  We discussed that current options include increasing blood pressure medication versus managing with lifestyle interventions/weight loss.  Patient would like to make efforts to limit sodium intake and increase physical activity for 1 month before increasing medicine.  Continue losartan-HCTZ 100-12.5 mg daily, amlodipine 10 mg daily, carvedilol 12.5 mg daily.  If necessary, if not at goal can increase losartan HCTZ to 100-25 mg daily.

## 2023-02-12 NOTE — Patient Instructions (Addendum)
GOAL BLOOD PRESSURE: <130/80  Think about possibly seeing a different nutritionist or a weight management specialist like [facility]. Managing weight can have a great domino effect in helping with blood pressure, blood sugar, and cholesterol as well.  Great podcast with amazing information: The Obesity Guide with Matthea Rentea  You are also due for the Shingles shots which you can get at your pharmacy!

## 2023-02-12 NOTE — Assessment & Plan Note (Signed)
A1c 6.1.  Will continue to monitor.

## 2023-02-12 NOTE — Progress Notes (Signed)
Complete physical exam  Patient: Keith Vance   DOB: April 17, 1961   62 y.o. Male  MRN: [id number]  Subjective:    Chief Complaint  Patient presents with   Annual Exam    Keith Vance is a 62 y.o. male who presents today for a complete physical exam. He reports consuming a general diet, he tries to limit sodium. He generally feels well. He reports sleeping poorly, trazodone helps him fall asleep but he is still waking up many times during the night. He does not have additional problems to discuss today.    Most recent fall risk assessment:    02/12/2023    4:07 PM  Fall Risk   Falls in the past year? 1  Number falls in past yr: 1  Injury with Fall? 0  Risk for fall due to : History of fall(s)  Follow up Falls evaluation completed     Most recent depression and anxiety screenings:    02/12/2023    4:08 PM 11/09/2022    2:43 PM  PHQ 2/9 Scores  PHQ - 2 Score 0 0      02/12/2023    4:08 PM 02/22/2022    2:52 PM 12/14/2021   11:06 AM 02/23/2021    3:22 PM  GAD 7 : Generalized Anxiety Score  Nervous, Anxious, on Edge 0 0 0 0  Control/stop worrying 0 0 0 0  Worry too much - different things 0 0 0 0  Trouble relaxing 0 0 0 0  Restless 0 0 0 0  Easily annoyed or irritable 0 0 0 0  Afraid - awful might happen 0 0 0 0  Total GAD 7 Score 0 0 0 0  Anxiety Difficulty   Not difficult at all     Patient Active Problem List   Diagnosis Date Noted   Other insomnia 02/12/2023   Vasomotor rhinitis 02/26/2022   Class 3 severe obesity with serious comorbidity and body mass index (BMI) of 40.0 to 44.9 in adult (HCC) 03/17/2021   Elevated coronary artery calcium score 12/03/2020   Elevated serum creatinine 02/24/2019   Pre-diabetes 02/24/2019   Vitamin D deficiency 02/24/2019   NAFLD (nonalcoholic fatty liver disease) 34/74/2595   Dyslipidemia, goal LDL below 70 03/18/2018   Morbid obesity (HCC) 02/27/2018   h/o elevated LFT's- dx of Fatty liver disease, nonalcoholic 02/27/2018    Primary osteoarthritis of left knee 07/30/2017   Thoracic aortic aneurysm (HCC) 01/07/2016   Essential hypertension 11/14/2015   Leukocytosis 02/27/2015   Elevated LFTs 03/07/2012   Sleep apnea 03/07/2012    Past Surgical History:  Procedure Laterality Date   HERNIA REPAIR Left    inguinal, and umbilical   JOINT REPLACEMENT     REVISION TOTAL KNEE ARTHROPLASTY Left 07/30/2017   LEFT TOTAL KNEE PARTIAL REVISION AND SYNOVECTOMY/notes 07/30/2017   TOTAL KNEE ARTHROPLASTY Left 04/24/2016   Procedure: LEFT TOTAL KNEE ARTHROPLASTY;  Surgeon: Durene Romans, MD;  Location: [facility];  Service: Orthopedics;  Laterality: Left;   TOTAL KNEE REVISION Left 07/30/2017   Procedure: LEFT TOTAL KNEE PARTIAL REVISION AND SYNOVECTOMY;  Surgeon: Gean Birchwood, MD;  Location: [facility];  Service: Orthopedics;  Laterality: Left;   Social History   Tobacco Use   Smoking status: Never    Passive exposure: Never   Smokeless tobacco: Never   Tobacco comments:    off and on as a teenager - very little per patient   Vaping Use   Vaping Use: Never used  Substance Use Topics   Alcohol use: No   Drug use: No   Family History  Problem Relation Age of Onset   Cancer Mother    Hypertension Mother    Hyperlipidemia Mother    Stroke Mother    Heart disease Mother    Depression Mother    Hypertension Father    Diabetes Father    Cancer Father        bone   Hyperlipidemia Father    Heart attack Maternal Grandmother        >41s of age   No Known Allergies   Patient Care Team: Melida Quitter, PA as PCP - General (Family Medicine) Runell Gess, MD as Consulting Physician (Cardiology) Wynetta Emery., MD as Consulting Physician (Orthopedic Surgery) Durene Romans, MD as Consulting Physician (Orthopedic Surgery)   Outpatient Medications Prior to Visit  Medication Sig   amLODipine (NORVASC) 10 MG tablet TAKE 1 TABLET(10 MG) BY MOUTH DAILY   aspirin EC 81 MG EC tablet Take 1 tablet (81 mg total)  by mouth daily.   atorvastatin (LIPITOR) 40 MG tablet TAKE 1 TABLET(40 MG) BY MOUTH DAILY   carvedilol (COREG) 12.5 MG tablet TAKE 1 TABLET(12.5 MG) BY MOUTH TWICE DAILY WITH A MEAL   fluticasone (FLONASE) 50 MCG/ACT nasal spray Place 2 sprays into both nostrils daily.   losartan-hydrochlorothiazide (HYZAAR) 100-12.5 MG tablet TAKE 1 TABLET BY MOUTH DAILY   meloxicam (MOBIC) 7.5 MG tablet TAKE 1 TABLET(7.5 MG) BY MOUTH DAILY   Multiple Vitamins-Minerals (MULTIVITAMIN WITH MINERALS) tablet Take 1 tablet by mouth daily.   Omega-3 Fatty Acids (FISH OIL) 1200 MG CAPS Take 1,200 mg by mouth daily.   sildenafil (VIAGRA) 25 MG tablet TAKE 1 TABLET(25 MG) BY MOUTH DAILY AS NEEDED FOR ERECTILE DYSFUNCTION   Vitamin D, Ergocalciferol, (DRISDOL) 1.25 MG (50000 UNIT) CAPS capsule TAKE 1 CAPSULE BY MOUTH WEEKLY   [DISCONTINUED] traZODone (DESYREL) 50 MG tablet Take 0.5-1 tablets (25-50 mg total) by mouth at bedtime as needed for sleep.   No facility-administered medications prior to visit.    Review of Systems  Constitutional:  Negative for chills, fever and malaise/fatigue.  HENT:  Negative for congestion and hearing loss.   Eyes:  Negative for blurred vision and double vision.  Respiratory:  Negative for cough and shortness of breath.   Cardiovascular:  Negative for chest pain, palpitations and leg swelling.  Gastrointestinal:  Negative for abdominal pain, constipation, diarrhea and heartburn.  Genitourinary:  Negative for frequency and urgency.  Musculoskeletal:  Negative for myalgias and neck pain.  Neurological:  Negative for headaches.  Endo/Heme/Allergies:  Negative for polydipsia.  Psychiatric/Behavioral:  Negative for depression. The patient has insomnia. The patient is not nervous/anxious.       Objective:    BP (!) 147/89   Pulse 94   Temp 97.9 F (36.6 C) (Oral)   Ht 5\' 9"  (1.753 m)   Wt (!) 311 lb 12 oz (141.4 kg)   SpO2 94%   BMI 46.04 kg/m    Physical Exam Constitutional:       General: He is not in acute distress.    Appearance: Normal appearance.  HENT:     Head: Normocephalic and atraumatic.     Right Ear: Tympanic membrane and ear canal normal.     Left Ear: Tympanic membrane and ear canal normal.     Nose: Nose normal.     Mouth/Throat:     Mouth: Mucous membranes are moist.  Pharynx: Oropharynx is clear.  Eyes:     Conjunctiva/sclera: Conjunctivae normal.     Pupils: Pupils are equal, round, and reactive to light.  Neck:     Thyroid: No thyroid mass, thyromegaly or thyroid tenderness.  Cardiovascular:     Rate and Rhythm: Normal rate and regular rhythm.     Pulses: Normal pulses.     Heart sounds: Normal heart sounds. No murmur heard.    No friction rub. No gallop.  Pulmonary:     Effort: Pulmonary effort is normal.     Breath sounds: Normal breath sounds. No wheezing, rhonchi or rales.  Abdominal:     General: Bowel sounds are normal.     Palpations: Abdomen is soft. There is no mass.     Tenderness: There is no abdominal tenderness.  Musculoskeletal:        General: Normal range of motion.     Cervical back: Normal range of motion and neck supple.  Lymphadenopathy:     Cervical: No cervical adenopathy.  Skin:    General: Skin is warm and dry.  Neurological:     Mental Status: He is alert and oriented to person, place, and time.     Cranial Nerves: No cranial nerve deficit.     Motor: No weakness.     Deep Tendon Reflexes: Reflexes normal.  Psychiatric:        Mood and Affect: Mood normal.       Assessment & Plan:    Routine Health Maintenance and Physical Exam  Immunization History  Administered Date(s) Administered   Hepatitis B, ADULT 09/09/2013, 10/10/2013, 03/10/2014   Influenza Inj Mdck Quad Pf 05/17/2018   Influenza,inj,Quad PF,6+ Mos 06/08/2017, 05/29/2019, 05/10/2022   Influenza-Unspecified 05/25/2021   PFIZER(Purple Top)SARS-COV-2 Vaccination 09/18/2019, 10/09/2019, 07/08/2020   Pneumococcal Polysaccharide-23  02/28/2015   Tdap 12/14/2021    Health Maintenance  Topic Date Due   Zoster Vaccines- Shingrix (1 of 2) Never done   COVID-19 Vaccine (4 - 2023-24 season) 04/28/2022   Colonoscopy  11/27/2023 (Originally 11/09/2006)   INFLUENZA VACCINE  03/29/2023   DTaP/Tdap/Td (2 - Td or Tdap) 12/15/2031   Hepatitis C Screening  Completed   HIV Screening  Completed   HPV VACCINES  Aged Out   Due for colonoscopy, but cannot afford it this year due to wife having multiple surgeries.  He does plan on having a colonoscopy next year.  Discussed health benefits of physical activity, and encouraged him to engage in regular exercise appropriate for his age and condition.  Wellness examination  Essential hypertension Assessment & Plan: BP goal <130/80.  Blood pressure above goal 142/88, on repeat 147/89.  We discussed that current options include increasing blood pressure medication versus managing with lifestyle interventions/weight loss.  Patient would like to make efforts to limit sodium intake and increase physical activity for 1 month before increasing medicine.  Continue losartan-HCTZ 100-12.5 mg daily, amlodipine 10 mg daily, carvedilol 12.5 mg daily.  If necessary, if not at goal can increase losartan HCTZ to 100-25 mg daily.   Dyslipidemia, goal LDL below 70 Assessment & Plan: Last lipid panel: LDL 81, HDL 51, triglycerides 146.  Continue atorvastatin 40 mg daily.   Pre-diabetes Assessment & Plan: A1c 6.1.  Will continue to monitor.   Other insomnia Assessment & Plan: Discontinue trazodone.  Patient has taken Ambien in the past and it has worked well but is not recommended for chronic treatment of insomnia.  Trial of eszopiclone 1 mg nightly for sleep  induction and maintenance.  Use CPAP nightly as he has been.  Orders: -     Eszopiclone; Take 1 tablet (1 mg total) by mouth at bedtime as needed for sleep. Take immediately before bedtime  Dispense: 30 tablet; Refill: 1  Obstructive sleep  apnea syndrome Assessment & Plan: Continue using CPAP nightly.   Morbid obesity (HCC) Assessment & Plan: We discussed management options including lifestyle interventions, weight management specialists, nutritionist, medication options.  Weight management would have a fantastic domino effect of also helping with hypertension, hyperlipidemia, prediabetes, sleep apnea.  Patient is unsure what approach she would like to take right now, in the past he has had bad experience with the nutritionist in [place].  We discussed trying lifestyle interventions for blood pressure and at follow-up in 1 month, we will discuss whether he would like to see nutritionist, weight management specialist, or discuss medication support further.  Patient verbalized understanding is agreeable to this plan.     Return in about 4 weeks (around 03/12/2023) for follow-up for HTN, sleep.     Melida Quitter, PA

## 2023-02-12 NOTE — Assessment & Plan Note (Signed)
We discussed management options including lifestyle interventions, weight management specialists, nutritionist, medication options.  Weight management would have a fantastic domino effect of also helping with hypertension, hyperlipidemia, prediabetes, sleep apnea.  Patient is unsure what approach she would like to take right now, in the past he has had bad experience with the nutritionist in [place].  We discussed trying lifestyle interventions for blood pressure and at follow-up in 1 month, we will discuss whether he would like to see nutritionist, weight management specialist, or discuss medication support further.  Patient verbalized understanding is agreeable to this plan.

## 2023-02-12 NOTE — Assessment & Plan Note (Signed)
Discontinue trazodone.  Patient has taken Ambien in the past and it has worked well but is not recommended for chronic treatment of insomnia.  Trial of eszopiclone 1 mg nightly for sleep induction and maintenance.  Use CPAP nightly as he has been.

## 2023-02-12 NOTE — Assessment & Plan Note (Signed)
Last lipid panel: LDL 81, HDL 51, triglycerides 146.  Continue atorvastatin 40 mg daily.

## 2023-02-15 ENCOUNTER — Other Ambulatory Visit: Payer: Self-pay | Admitting: Family Medicine

## 2023-02-15 DIAGNOSIS — G4700 Insomnia, unspecified: Secondary | ICD-10-CM

## 2023-03-14 ENCOUNTER — Encounter: Payer: Self-pay | Admitting: Family Medicine

## 2023-03-14 ENCOUNTER — Ambulatory Visit (INDEPENDENT_AMBULATORY_CARE_PROVIDER_SITE_OTHER): Payer: PRIVATE HEALTH INSURANCE | Admitting: Family Medicine

## 2023-03-14 VITALS — BP 157/93 | HR 57 | Ht 69.0 in | Wt 308.4 lb

## 2023-03-14 DIAGNOSIS — Z6841 Body Mass Index (BMI) 40.0 and over, adult: Secondary | ICD-10-CM | POA: Diagnosis not present

## 2023-03-14 DIAGNOSIS — I10 Essential (primary) hypertension: Secondary | ICD-10-CM | POA: Diagnosis not present

## 2023-03-14 DIAGNOSIS — G4709 Other insomnia: Secondary | ICD-10-CM

## 2023-03-14 DIAGNOSIS — E66813 Obesity, class 3: Secondary | ICD-10-CM

## 2023-03-14 MED ORDER — LOSARTAN POTASSIUM-HCTZ 100-25 MG PO TABS
1.0000 | ORAL_TABLET | Freq: Every day | ORAL | 1 refills | Status: DC
Start: 2023-03-14 — End: 2023-09-06

## 2023-03-14 NOTE — Assessment & Plan Note (Signed)
BP goal <130/80.  Blood pressure remains above goal during office visit again today.  Patient's efforts have resulted in weight loss since his last appointment, but he does have stressors in his life that will not be changing for some time.  Increase to losartan-HCTZ 100-25 mg daily.  Continue amlodipine 10 mg daily, carvedilol 12.5 mg daily.  Continue ambulatory blood pressure monitoring, keep log until nurse visit for blood pressure check in 2 weeks.

## 2023-03-14 NOTE — Progress Notes (Signed)
Established Patient Office Visit  Subjective   Patient ID: Keith Vance, male    DOB: 09/22/1960  Age: 62 y.o. MRN: [id number]  Chief Complaint  Patient presents with   Medical Management of Chronic Issues    HPI Keith Vance is a 62 y.o. male presenting today for follow up of hypertension, sleep.  He is also excited that he has lost about 4 pounds since his last appointment. Hypertension: Patient here for follow-up of elevated blood pressure. He is exercising and is adherent to low salt diet.   Pt denies chest pain, SOB, dizziness, edema, syncope, fatigue or heart palpitations. Taking losartan-hydrochlorothiazide, amlodipine, carvedilol, reports excellent compliance with treatment. Denies side effects.  Blood pressures at home when he checks them in the morning are often 120s systolic.  He has been under increased amount of stress due to work, working 70 to 80 hours a week. Insomnia: He struggles with sleep induction and sleep maintenance. He is taking Lunesta almost nightly, denies side effects.   ROS Negative unless otherwise noted in HPI   Objective:     BP (!) 157/93   Pulse (!) 57   Ht 5\' 9"  (1.753 m)   Wt (!) 308 lb 6.4 oz (139.9 kg)   SpO2 94%   BMI 45.54 kg/m   Physical Exam Constitutional:      General: He is not in acute distress.    Appearance: Normal appearance.  HENT:     Head: Normocephalic and atraumatic.  Cardiovascular:     Rate and Rhythm: Normal rate and regular rhythm.     Heart sounds: Normal heart sounds. No murmur heard.    No friction rub. No gallop.  Pulmonary:     Effort: Pulmonary effort is normal. No respiratory distress.     Breath sounds: Normal breath sounds. No wheezing, rhonchi or rales.  Skin:    General: Skin is warm and dry.  Neurological:     Mental Status: He is alert and oriented to person, place, and time.  Psychiatric:        Mood and Affect: Mood normal.      Assessment & Plan:  Essential hypertension Assessment &  Plan: BP goal <130/80.  Blood pressure remains above goal during office visit again today.  Patient's efforts have resulted in weight loss since his last appointment, but he does have stressors in his life that will not be changing for some time.  Increase to losartan-HCTZ 100-25 mg daily.  Continue amlodipine 10 mg daily, carvedilol 12.5 mg daily.  Continue ambulatory blood pressure monitoring, keep log until nurse visit for blood pressure check in 2 weeks.  Orders: -     Losartan Potassium-HCTZ; Take 1 tablet by mouth daily.  Dispense: 90 tablet; Refill: 1  Other insomnia Assessment & Plan: Trial of eszopiclone 1 mg nightly for sleep induction and maintenance has been working well.  It is safe to use for chronic management of insomnia in combination with nightly CPAP.  Will continue to monitor.   Class 3 severe obesity with serious comorbidity and body mass index (BMI) of 40.0 to 44.9 in adult, unspecified obesity type [facility]) Assessment & Plan: Continue with weight loss efforts, including following a low-carb/low-fat diet and increasing physical activity when possible.  Will continue to monitor.  Weight has decreased 4 pounds since last visit.     Return in about 2 weeks (around 03/28/2023) for BP check nurse visit; 4 months follow up for HTN, HLD, prediabetes, fasting blood  work 1 week before.    Melida Quitter, PA

## 2023-03-14 NOTE — Assessment & Plan Note (Signed)
Continue with weight loss efforts, including following a low-carb/low-fat diet and increasing physical activity when possible.  Will continue to monitor.  Weight has decreased 4 pounds since last visit.

## 2023-03-14 NOTE — Assessment & Plan Note (Signed)
Trial of eszopiclone 1 mg nightly for sleep induction and maintenance has been working well.  It is safe to use for chronic management of insomnia in combination with nightly CPAP.  Will continue to monitor.

## 2023-03-23 ENCOUNTER — Other Ambulatory Visit: Payer: Self-pay

## 2023-03-23 ENCOUNTER — Emergency Department (HOSPITAL_COMMUNITY)
Admission: EM | Admit: 2023-03-23 | Discharge: 2023-03-23 | Disposition: A | Discharge status: Home / Self Care | Payer: No Typology Code available for payment source | Attending: Emergency Medicine | Admitting: Emergency Medicine

## 2023-03-23 ENCOUNTER — Emergency Department (HOSPITAL_COMMUNITY): Payer: No Typology Code available for payment source

## 2023-03-23 ENCOUNTER — Encounter (HOSPITAL_COMMUNITY): Payer: Self-pay

## 2023-03-23 DIAGNOSIS — I10 Essential (primary) hypertension: Secondary | ICD-10-CM | POA: Diagnosis not present

## 2023-03-23 DIAGNOSIS — Z79899 Other long term (current) drug therapy: Secondary | ICD-10-CM | POA: Diagnosis not present

## 2023-03-23 DIAGNOSIS — Z7982 Long term (current) use of aspirin: Secondary | ICD-10-CM | POA: Insufficient documentation

## 2023-03-23 DIAGNOSIS — R079 Chest pain, unspecified: Secondary | ICD-10-CM | POA: Insufficient documentation

## 2023-03-23 LAB — CBC
HCT: 44.4 % (ref 39.0–52.0)
Hemoglobin: 15.2 g/dL (ref 13.0–17.0)
MCH: 31.6 pg (ref 26.0–34.0)
MCHC: 34.2 g/dL (ref 30.0–36.0)
MCV: 92.3 fL (ref 80.0–100.0)
Platelets: 146 10*3/uL — ABNORMAL LOW (ref 150–400)
RBC: 4.81 MIL/uL (ref 4.22–5.81)
RDW: 13.4 % (ref 11.5–15.5)
WBC: 7.3 10*3/uL (ref 4.0–10.5)
nRBC: 0 % (ref 0.0–0.2)

## 2023-03-23 LAB — BASIC METABOLIC PANEL
Anion gap: 11 (ref 5–15)
BUN: 22 mg/dL (ref 8–23)
CO2: 26 mmol/L (ref 22–32)
Calcium: 9.5 mg/dL (ref 8.9–10.3)
Chloride: 97 mmol/L — ABNORMAL LOW (ref 98–111)
Creatinine, Ser: 1.51 mg/dL — ABNORMAL HIGH (ref 0.61–1.24)
GFR, Estimated: 52 mL/min — ABNORMAL LOW (ref >60)
Glucose, Bld: 127 mg/dL — ABNORMAL HIGH (ref 70–99)
Potassium: 3.7 mmol/L (ref 3.5–5.1)
Sodium: 134 mmol/L — ABNORMAL LOW (ref 135–145)

## 2023-03-23 LAB — TROPONIN I (HIGH SENSITIVITY)
Troponin I (High Sensitivity): 13 ng/L (ref <18)
Troponin I (High Sensitivity): 11 ng/L (ref <18)

## 2023-03-23 MED ORDER — IOHEXOL 350 MG/ML SOLN
75.0000 mL | Freq: Once | INTRAVENOUS | Status: AC | PRN
  Administered 2023-03-23: 75 mL via INTRAVENOUS

## 2023-03-23 MED ORDER — NITROGLYCERIN 0.4 MG SL SUBL
0.4000 mg | SUBLINGUAL_TABLET | SUBLINGUAL | Status: DC | PRN
  Filled 2023-03-23: qty 1

## 2023-03-23 NOTE — ED Triage Notes (Signed)
Arrives POV with mid chest pain a little to the right that began ~5am today. He said he clocks in for work about 3am and works 12 hour days 7 days a week.   Says he has a known 5cm AAA that is being monitored but has not gone this year. Also sts that he has 5, 20% blockages last time he was evaluated.

## 2023-03-23 NOTE — Discharge Instructions (Addendum)
You were seen in the emergency department today for chest pain.  I spoke with the cardiologist who would like to see you in the office on day 7/29 at 2:20 PM.  I have placed their contact information in your discharge paperwork.  Please return to emergency department for worsening symptoms.

## 2023-03-23 NOTE — ED Provider Notes (Signed)
[place] EMERGENCY DEPARTMENT AT [facility] Provider Note   CSN: [id number] Arrival date & time: 03/23/23  9147     History  Chief Complaint  Patient presents with   Chest Pain    Keith Vance is a 62 y.o. male.  With past medical history of hypertension, NAFLD, obstructive sleep apnea, thoracic aortic aneurysm who presents to the emergency department with chest pain.  States symptoms began around 520 this morning.  He was at work when he had gradual onset of central chest pain that he states became severe.  He describes it as tightness.  The pain radiates through to the right shoulder blade.  He has associated dyspnea on exertion that improves with rest.  Nothing makes the chest pain better or worse.  He states that since onset the severity of the pain has decreased but he has residual tightness that still present.  When he had onset of pain he had diaphoresis and states that he has dry heaves a few times this morning without vomiting.  He does state he has a history of a thoracic aortic aneurysm but has not gone to his surveillance appointments in a few years.  Has been taking his antihypertensives as prescribed.  Denies palpitations, abdominal pain, syncope.   Chest Pain Associated symptoms: diaphoresis, nausea and shortness of breath        Home Medications Prior to Admission medications   Medication Sig Start Date End Date Taking? Authorizing Provider  amLODipine (NORVASC) 10 MG tablet TAKE 1 TABLET(10 MG) BY MOUTH DAILY 11/09/22   Melida Quitter, PA  aspirin EC 81 MG EC tablet Take 1 tablet (81 mg total) by mouth daily. 10/13/18   Berton Mount I, MD  atorvastatin (LIPITOR) 40 MG tablet TAKE 1 TABLET(40 MG) BY MOUTH DAILY 02/06/23   Saralyn Pilar A, PA  carvedilol (COREG) 12.5 MG tablet TAKE 1 TABLET(12.5 MG) BY MOUTH TWICE DAILY WITH A MEAL 02/07/23   Saralyn Pilar A, PA  eszopiclone (LUNESTA) 1 MG TABS tablet Take 1 tablet (1 mg total) by mouth at  bedtime as needed for sleep. Take immediately before bedtime 02/12/23   Saralyn Pilar A, PA  fluticasone (FLONASE) 50 MCG/ACT nasal spray Place 2 sprays into both nostrils daily. 02/22/22   Carlean Jews, NP  losartan-hydrochlorothiazide (HYZAAR) 100-25 MG tablet Take 1 tablet by mouth daily. 03/14/23   Melida Quitter, PA  meloxicam (MOBIC) 7.5 MG tablet TAKE 1 TABLET(7.5 MG) BY MOUTH DAILY 01/23/23   Melida Quitter, PA  Multiple Vitamins-Minerals (MULTIVITAMIN WITH MINERALS) tablet Take 1 tablet by mouth daily.    [provider]  Omega-3 Fatty Acids (FISH OIL) 1200 MG CAPS Take 1,200 mg by mouth daily.    [provider]  sildenafil (VIAGRA) 25 MG tablet TAKE 1 TABLET(25 MG) BY MOUTH DAILY AS NEEDED FOR ERECTILE DYSFUNCTION 11/09/22   Melida Quitter, PA  Vitamin D, Ergocalciferol, (DRISDOL) 1.25 MG (50000 UNIT) CAPS capsule TAKE 1 CAPSULE BY MOUTH WEEKLY 10/27/21   Mayer Masker, PA-C      Allergies    Patient has no known allergies.    Review of Systems   Review of Systems  Constitutional:  Positive for diaphoresis.  Respiratory:  Positive for chest tightness and shortness of breath.   Cardiovascular:  Positive for chest pain.  Gastrointestinal:  Positive for nausea.  All other systems reviewed and are negative.   Physical Exam Updated Vital Signs BP (!) 141/87   Pulse [name])  55   Temp 98.4 F (36.9 C) (Oral)   Resp 19   Ht 5\' 9"  (1.753 m)   Wt (!) 138.3 kg   SpO2 94%   BMI 45.04 kg/m  Physical Exam Vitals and nursing note reviewed.  Constitutional:      General: He is not in acute distress.    Appearance: Normal appearance. He is well-developed. He is obese. He is not ill-appearing or toxic-appearing.  HENT:     Head: Normocephalic.  Eyes:     General: No scleral icterus. Cardiovascular:     Rate and Rhythm: Normal rate and regular rhythm.     Pulses:          Radial pulses are 2+ on the right side and 2+ on the left side.     Heart sounds:  Normal heart sounds. No murmur heard. Pulmonary:     Effort: Pulmonary effort is normal. No tachypnea or respiratory distress.     Breath sounds: Normal breath sounds.  Chest:     Chest wall: No tenderness.  Abdominal:     General: Bowel sounds are normal.     Palpations: Abdomen is soft.  Musculoskeletal:     Right lower leg: No edema.     Left lower leg: No edema.  Skin:    General: Skin is warm and dry.     Capillary Refill: Capillary refill takes less than 2 seconds.  Neurological:     General: No focal deficit present.     Mental Status: He is alert and oriented to person, place, and time.  Psychiatric:        Mood and Affect: Mood normal.        Behavior: Behavior normal.     ED Results / Procedures / Treatments   Labs (all labs ordered are listed, but only abnormal results are displayed) Labs Reviewed  BASIC METABOLIC PANEL - Abnormal; Notable for the following components:      Result Value   Sodium 134 (*)    Chloride 97 (*)    Glucose, Bld 127 (*)    Creatinine, Ser 1.51 (*)    GFR, Estimated 52 (*)    All other components within normal limits  CBC - Abnormal; Notable for the following components:   Platelets 146 (*)    All other components within normal limits  BRAIN NATRIURETIC PEPTIDE  TROPONIN I (HIGH SENSITIVITY)  TROPONIN I (HIGH SENSITIVITY)    EKG EKG Interpretation Date/Time:  Friday March 23 2023 06:01:40 EDT Ventricular Rate:  59 PR Interval:  166 QRS Duration:  112 QT Interval:  440 QTC Calculation: 435 R Axis:   -41  Text Interpretation: Sinus bradycardia Left axis deviation Minimal voltage criteria for LVH, may be normal variant ( Cornell product ) Abnormal ECG When compared with ECG of 23-Mar-2023 05:53, PREVIOUS ECG IS PRESENT No acute changes No significant change since last tracing Confirmed by Derwood Kaplan [phone]) on 03/23/2023 9:03:13 AM  Radiology CT Angio Chest Aorta W and/or Wo Contrast  Result Date: 03/23/2023 CLINICAL DATA:   Acute aortic syndrome suspected. EXAM: CT ANGIOGRAPHY CHEST WITH CONTRAST TECHNIQUE: Multidetector CT imaging of the chest was performed using the standard protocol during bolus administration of intravenous contrast. Multiplanar CT image reconstructions and MIPs were obtained to evaluate the vascular anatomy. RADIATION DOSE REDUCTION: This exam was performed according to the departmental dose-optimization program which includes automated exposure control, adjustment of the mA and/or kV according to patient size and/or use of iterative reconstruction  technique. CONTRAST:  75mL OMNIPAQUE IOHEXOL 350 MG/ML SOLN COMPARISON:  12/16/2020 FINDINGS: Cardiovascular: Ascending thoracic aorta is dilated measuring up to 4.3 cm and minimally changed from the previous examination. Motion artifact at the aortic root but no evidence to suggest an aortic dissection or intramural hematoma. Great vessels are patent with bovine arch anatomy. 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 Proximal SMA and proximal bilateral renal arteries are patent. Normal caliber of the proximal abdominal aorta. Heart size is within normal limits without significant pericardial effusion. Mediastinum/Nodes: Enlarged mediastinal and bilateral hilar lymph nodes. Right paratracheal lymph node on image 46/5 measures 1.6 cm in the short axis and previously measured 0.9 cm. Prevascular lymph node on image 51/5 measures 1.0 cm in the short axis previously measured 0.8 cm. Right hilar adenopathy on image 75/5 measures 2.4 cm and previously measured 1.7 cm. Overall, the mediastinal and hilar lymphadenopathy has progressed. No axillary lymph node enlargement. No significant supraclavicular lymphadenopathy. Thyroid tissue is unremarkable. Normal appearance of the esophagus. Lungs/Pleura: Trachea and mainstem bronchi are patent. Stable 5 mm nodule in the right upper lobe on image 69/6. Stable 5 mm nodule in left upper  lobe on image 62/6. Stable 3 mm nodule at the left lung apex on image 22/6. Small amount atelectasis or volume loss in the lingula. No pleural effusions. Hazy parenchymal densities throughout both lungs with small reticular densities particularly in the upper lungs. Some of these reticular densities were present on the previous examination. Upper Abdomen: Subtle high-density material in the gallbladder could represent cholelithiasis. Left hepatic lobe is slightly prominent but similar to the previous examination. Difficult to evaluate for liver nodularity. Nodule involving the left adrenal gland lateral limb measures roughly 2.2 cm and stable. Mildly prominent nodal tissue near the pancreatic neck and porta hepatis has minimally changed. Musculoskeletal: No acute bone abnormality. Review of the MIP images confirms the above findings. IMPRESSION: 1. No evidence for an aortic dissection or intramural hematoma. 2. Enlarged mediastinal and bilateral hilar lymph nodes. These lymph nodes have enlarged since 2022. Lymphoproliferative process or even sarcoidosis cannot be excluded. 3. Hazy parenchymal densities throughout both lungs with small reticular densities. Some of these reticular densities were present on the previous examination. Findings are nonspecific but could represent mild edema or atelectasis superimposed on chronic disease. 4. Stable aneurysmal dilatation of the ascending thoracic aorta measuring up to 4.3 cm. Recommend annual imaging followup by CTA or MRA. This recommendation follows 2010 ACCF/AHA/AATS/ACR/ASA/SCA/SCAI/SIR/STS/SVM Guidelines for the Diagnosis and Management of Patients with Thoracic Aortic Disease. Circulation. 2010; 121: I951-O841. Aortic aneurysm NOS (ICD10-I71.9) 5. 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 6. Stable small pulmonary nodules. Nodules have minimally changed since 2022. 7. Stable left adrenal nodule. This left adrenal nodule  or fullness is minimally changed since 10/11/2018. Although this finding is indeterminate on this post contrast examination, favor a benign etiology based on the stability. 8. Possible cholelithiasis. Electronically Signed   By: Richarda Overlie M.D.   On: 03/23/2023 12:02   DG Chest 2 View  Result Date: 03/23/2023 CLINICAL DATA:  62 year old male with mid chest pain onset 0500 hours. EXAM: CHEST - 2 VIEW COMPARISON:  CTA chest 12/16/2020 and earlier. FINDINGS: PA and lateral views at 0630 hours. Lung volumes are within normal limits. Cardiac and mediastinal contours are stable and within normal limits. Visualized tracheal air column is within normal limits. Lung markings are stable. No pneumothorax,  pulmonary edema, pleural effusion or confluent lung opacity. No acute osseous abnormality identified. Negative visible bowel gas. IMPRESSION: No acute cardiopulmonary abnormality. Electronically Signed   By: Odessa Fleming M.D.   On: 03/23/2023 06:52    Procedures Procedures   Medications Ordered in ED Medications  nitroGLYCERIN (NITROSTAT) SL tablet 0.4 mg (has no administration in time range)  iohexol (OMNIPAQUE) 350 MG/ML injection 75 mL (75 mLs Intravenous Contrast Given 03/23/23 1039)    ED Course/ Medical Decision Making/ A&P   {            HEART Score: 3                Medical Decision Making Amount and/or Complexity of Data Reviewed Labs: ordered. Radiology: ordered.  Risk Prescription drug management.  Initial Impression and Ddx 63 year old male who presents to the emergency department with chest pain Patient PMH that increases complexity of ED encounter: Hypertension, thoracic aortic aneurysm Differential: Acute chest syndrome, stable angina, atypical angina, pulmonary embolism, pneumothorax, aortic dissection, pleural effusion, CHF, COPD, asthma, myocarditis, pericarditis, cardiac tamponade, chest wall pain   Interpretation of Diagnostics I independent reviewed and interpreted the labs as  followed: CBC unremarkable, BMP with AKI, creatinine 1.5, troponin 13, delta pending  - I independently visualized the following imaging with scope of interpretation limited to determining acute life threatening conditions related to emergency care: Chest x-ray, which revealed no acute abnormalities.  CT angio chest aorta with stable findings  Patient Reassessment and Ultimate Disposition/Management 63 year old male who presents to the emergency department with chest pain.  On my exam he is stable appearing.  He is having some chest tightness at rest.  Concerning story given that he has a history of thoracic aortic aneurysm and has not been followed over the past few years for surveillance.  His pulses are equal bilaterally.  No neurological deficits or numbness or tingling.  I have lower suspicion for dissection at this time, however obtaining a CT angio chest aorta to evaluate further. He took aspirin already this morning.  Has not taken Viagra and I will long period of time.  Will give him a dose of nitroglycerin to see if this helps with his chest pain.  Troponins are negative and flat.  Negative imaging.  Consulted with cardiology who evaluated the workup and feel that he is appropriate for outpatient follow-up.  They have scheduled him an appointment for Monday, 03/26/2023 at 2:20 PM to be further evaluated.  EKG without ischemia or infarction, troponin x2 negative, doubt ACS  Does not appear fluid volume overloaded on exam, no edema on CXR, doubt CHF exacerbation  Considered but doubt PE. PERC1 for age, Anner Crete low risk so will defer d-dimer or CTA PE study at this time. CXR without evidence of pneumonia, pleural effusion or pneumothorax. No recent illnesses and troponin negative so doubt pericarditis or myocarditis  Symptoms inconsistent with aortic dissection  HEART Score: 3    Will discharge home with cardiology follow-up on Monday.  Given return precautions for any worsening symptoms.   Verbalized understanding.  Otherwise feel that he is appropriate to be released at this time.  The patient has been appropriately medically screened and/or stabilized in the ED. I have low suspicion for any other emergent medical condition which would require further screening, evaluation or treatment in the ED or require inpatient management. At time of discharge the patient is hemodynamically stable and in no acute distress. I have discussed work-up results and diagnosis with patient and  answered all questions. Patient is agreeable with discharge plan. We discussed strict return precautions for returning to the emergency department and they verbalized understanding.     Patient management required discussion with the following services or consulting groups:  Cardiology  Complexity of Problems Addressed Acute complicated illness or Injury  Additional Data Reviewed and Analyzed Further history obtained from: Further history from spouse/family member, Past medical history and medications listed in the EMR, Prior ED visit notes, and Care Everywhere  Patient Encounter Risk Assessment Consideration of hospitalization  Final Clinical Impression(s) / ED Diagnoses Final diagnoses:  Chest pain, unspecified type    Rx / DC Orders ED Discharge Orders     None         Cristopher Peru, PA-C 03/23/23 1252    Derwood Kaplan, MD 03/23/23 [phone]

## 2023-03-25 NOTE — Progress Notes (Unsigned)
Cardiology Clinic Note   Patient Name: Keith Vance Date of Encounter: 03/26/2023  Primary Care Provider:  Melida Quitter, PA Primary Cardiologist:  Keith Batty, MD  Patient Profile    62 year old male with history of hypertension, thoracic artery aneurysm, OSA, presented to the ED on 03/23/2023 for chest pain.  Per ED notes she described the pain as tightness radiating through to the right shoulder blade with associated dyspnea which improves with rest.  He also had associated diaphoresis and dry heaves.  He has not had thoracic aortic aneurysm assessed in several years prior to ED visit.  Thoracic CT scan was completed.  CT scan revealed thoracic aorta dilatation of 4.3 cm minimally changed from previous examination.  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  He also was found of a 3 mm nodule in the left lung apex with a small amount of atelectasis or volume loss in the lingula.  He was ruled out for ACS, and PE.  He was discharged and found to be hemodynamically stable and is here for follow-up.  He was last seen by cardiology on 12/03/2020 by Dr. Allyson Vance.  At that time his note documented that he had a cardiac catheterization at [facility] in 1999 revealing normal coronary arteries.  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  Past Medical History    Past Medical History:  Diagnosis Date   Arthritis    Ascending aortic aneurysm (HCC)    a. 10/2015 CTA chest: 4.4cm aneurysmal dil of Asc Ao-->rec f/u in 1 yr.   Back pain    Chest pain 10/11/2018   Fatty liver    GERD (gastroesophageal reflux disease)    hx    Heart disease    History of kidney stones    Hyperlipidemia    Hypertension    Joint pain    Liver disease    Lower extremity edema    Obesity    Pneumonia    hx   Prediabetes    Rheumatoid arthritis (HCC)    SBO (small bowel obstruction) (HCC)    a. 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   Sleep apnea    cpap   Past Surgical History:  Procedure Laterality Date   HERNIA REPAIR Left    inguinal, and umbilical   JOINT REPLACEMENT     REVISION TOTAL KNEE ARTHROPLASTY Left 07/30/2017   LEFT TOTAL KNEE PARTIAL REVISION AND SYNOVECTOMY/notes 07/30/2017   TOTAL KNEE ARTHROPLASTY Left 04/24/2016   Procedure: LEFT TOTAL KNEE ARTHROPLASTY;  Surgeon: Durene Romans, MD;  Location: [facility];  Service: Orthopedics;  Laterality: Left;   TOTAL KNEE REVISION Left 07/30/2017   Procedure: LEFT TOTAL KNEE PARTIAL REVISION AND SYNOVECTOMY;  Surgeon: Gean Birchwood, MD;  Location: [facility] OR;  Service: Orthopedics;  Laterality: Left;    Allergies  No Known Allergies  History of Present Illness    Mr. Keith Vance comes to the office today for ED follow-up with known history of hypertension, thoracic aneurysm, OSA, with evaluation for recurrent chest pain.  He was seen in the ED on 03/24/2023 ruled out for ACS, PE, CT of the chest did not reveal dissecting thoracic aneurysm.  Symptoms were  chest tightness, radiation to the left shoulder, with dry heaves and diaphoresis.,  He works as a Pension scheme manager, does not lift heavy boxes or carry objects that have significant weight to them, he does not push, pull, or drag any heavy machinery.   He is very specific about the location of the chest discomfort, located at the right upper chest radiating through to the scapula on the right, right line, sharp, increasing in intensity.  He has felt better since the ER visit.  No new symptoms or recurrence of symptoms.  He is concerned about pulmonary nodule seen on CT scan.  And is to be seen by his primary care provider for possible referral to pulmonology  at their discretion.  Home Medications    Current Outpatient Medications  Medication Sig Dispense Refill   amLODipine (NORVASC) 10 MG tablet TAKE 1 TABLET(10 MG) BY MOUTH DAILY 90 tablet 3   aspirin EC 81 MG EC tablet Take 1 tablet (81 mg total) by mouth daily. 30 tablet 0   atorvastatin (LIPITOR) 40 MG tablet TAKE 1 TABLET(40 MG) BY MOUTH DAILY 90 tablet 0   carvedilol (COREG) 12.5 MG tablet TAKE 1 TABLET(12.5 MG) BY MOUTH TWICE DAILY WITH A MEAL 180 tablet 0   Docusate Calcium (STOOL SOFTENER PO) Take by mouth.     eszopiclone (LUNESTA) 1 MG TABS tablet Take 1 tablet (1 mg total) by mouth at bedtime as needed for sleep. Take immediately before bedtime 30 tablet 1   fluticasone (FLONASE) 50 MCG/ACT nasal spray Place 2 sprays into both nostrils daily. 16 g 6   losartan-hydrochlorothiazide (HYZAAR) 100-25 MG tablet Take 1 tablet by mouth daily. 90 tablet 1   meloxicam (MOBIC) 7.5 MG tablet TAKE 1 TABLET(7.5 MG) BY MOUTH DAILY 30 tablet 1   Multiple Vitamins-Minerals (MULTIVITAMIN WITH MINERALS) tablet Take 1 tablet by mouth daily.     Omega-3 Fatty Acids (FISH OIL) 1200 MG CAPS Take 1,200 mg by mouth daily.     sildenafil (VIAGRA) 25 MG tablet TAKE 1 TABLET(25 MG) BY MOUTH DAILY AS NEEDED FOR ERECTILE DYSFUNCTION 10 tablet 0   Vitamin D, Ergocalciferol, (DRISDOL) 1.25 MG (50000 UNIT) CAPS capsule TAKE 1 CAPSULE BY MOUTH WEEKLY (Patient not taking: Reported on 03/26/2023) 4 capsule 0   No current facility-administered medications for this visit.     Family History    Family History  Problem Relation Age of Onset   Cancer Mother    Hypertension Mother    Hyperlipidemia Mother    Stroke Mother    Heart disease Mother    Depression Mother    Hypertension Father    Diabetes Father    Cancer Father        bone   Hyperlipidemia Father    Heart attack Maternal Grandmother        >93s of age   He indicated that his mother is alive. He indicated that the status of his father is unknown.  He indicated that the status of his maternal grandmother is unknown.  Social History    Social History   Socioeconomic History   Marital status: Married    Spouse name: Not on file   Number of children: Not on file   Years of education: Not on file   Highest education level: Not on file  Occupational History   Occupation: welder fabicator  Tobacco Use   Smoking status: Never    Passive exposure: Never   Smokeless tobacco: Never   Tobacco comments:  off and on as a teenager - very little per patient   Vaping Use   Vaping status: Never Used  Substance and Sexual Activity   Alcohol use: No   Drug use: No   Sexual activity: Yes  Other Topics Concern   Not on file  Social History Narrative   Not on file   Social Determinants of Health   Financial Resource Strain: Not on file  Food Insecurity: Not on file  Transportation Needs: Not on file  Physical Activity: Not on file  Stress: Not on file  Social Connections: Not on file  Intimate Partner Violence: Not on file     Review of Systems    General:  No chills, fever, night sweats or weight changes.  Cardiovascular:  No chest pain, dyspnea on exertion, edema, orthopnea, palpitations, paroxysmal nocturnal dyspnea. Dermatological: No rash, lesions/masses Respiratory: No cough, dyspnea Urologic: No hematuria, dysuria Abdominal:   No nausea, vomiting, diarrhea, bright red blood per rectum, melena, or hematemesis Neurologic:  No visual changes, wkns, changes in mental status. All other systems reviewed and are otherwise negative except as noted above.       Physical Exam    VS:  BP 128/84 (BP Location: Left Arm, Patient Position: Sitting, Cuff Size: Normal)   Pulse (!) 56   Ht 5\' 10"  (1.778 m)   Wt (!) 302 lb 12.8 oz (137.3 kg)   SpO2 90%   BMI 43.45 kg/m  , BMI Body mass index is 43.45 kg/m.     GEN: Well nourished, well developed, in no acute distress. HEENT: normal. Neck: Supple, no JVD, carotid bruits, or  masses. Cardiac: RRR, no murmurs, rubs, or gallops. No clubbing, cyanosis, edema.  Radials/DP/PT 2+ and equal bilaterally.  Respiratory:  Respirations regular and unlabored, clear to auscultation bilaterally. GI: Soft, nontender, nondistended, BS + x 4. MS: no deformity or atrophy. Skin: warm and dry, no rash. Neuro:  Strength and sensation are intact. Psych: Normal affect.      Lab Results  Component Value Date   WBC 7.3 03/23/2023   HGB 15.2 03/23/2023   HCT 44.4 03/23/2023   MCV 92.3 03/23/2023   PLT 146 (L) 03/23/2023   Lab Results  Component Value Date   CREATININE 1.51 (H) 03/23/2023   BUN 22 03/23/2023   NA 134 (L) 03/23/2023   K 3.7 03/23/2023   CL 97 (L) 03/23/2023   CO2 26 03/23/2023   Lab Results  Component Value Date   ALT 85 (H) 02/05/2023   AST 77 (H) 02/05/2023   ALKPHOS 148 (H) 02/05/2023   BILITOT 0.9 02/05/2023   Lab Results  Component Value Date   CHOL 157 11/17/2022   HDL 51 11/17/2022   LDLCALC 81 11/17/2022   TRIG 146 11/17/2022   CHOLHDL 3.1 11/17/2022    Lab Results  Component Value Date   HGBA1C 6.1 (H) 02/05/2023     Review of Prior Studies   Coronary CTA 11/26/2019 IMPRESSION: 1. Minimal mixed proximal LAD stenosis, CADRADS = 1.   2. Coronary calcium score of 37. This was 59th percentile for age and sex matched control.   3. Normal coronary origin with right dominance.   4. Dilated ascending aorta to 4.4 cm.   5. Cardiac risk factor modification is recommended.   Echocardiogram: 10/11/2018  1. The left ventricle has normal systolic function with an ejection  fraction of 60-65%. The cavity size was normal. There is mildly increased  left ventricular wall thickness. Left ventricular  diastolic Doppler  parameters are consistent with impaired  relaxation.   2. The right ventricle has normal systolic function. The cavity was  normal. There is no increase in right ventricular wall thickness.   3. Left atrial size was moderately  dilated.   4. The mitral valve is normal in structure. Mild calcification of the  anterior mitral valve leaflet.   5. The tricuspid valve is normal in structure.   6. The aortic valve is tricuspid.   7. The pulmonic valve was normal in structure.   8. The ascending aorta is normal in size and structure.   9. There is mild dilatation of the aortic root measuring 38 mm.  10. Right atrial pressure is estimated at 5 mmHg.   Assessment & Plan   1.  Chest pain: Noncardiac in etiology at this time.  Pain is very specific to the right upper chest described as sharp straight line to back through to the right shoulder blade.  He was ruled out for ACS in the ED, thoracic aortic aneurysm was not dissecting.  Difficult to ascertain if this is musculoskeletal as he does not do any heavy lifting or pulling or pushing of heavy objects.  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  I am uncertain the etiology of his discomfort at this time.  He did have a cardiac CTA on 11/25/2019 which showed a coronary calcium score of 37, with minimal mixed proximal LAD stenosis only.    Continue primary prevention with weight loss, blood pressure control, statin therapy, and healthy diet.  He has lost weight as his wife has recently been diagnosed with Alphagan syndrome.  They are off of red meat.  Consider GI etiology, biliary dyskinesia.  The patient does not have gallstones per CT of the abdomen.  2.  Pulmonary nodules: Noted per CT scan.  Follow-up per primary care with referral to pulmonology at their discretion.  3.  Thoracic aortic aneurysm: Aneurysm size has not changed from prior examinations per chest CT measuring now at 4.2.  4.  Hypertension: Blood pressure is well-controlled today.  He will continue losartan HCTZ as directed.  5.  Hypercholesterolemia: Remains on atorvastatin 40 mg daily.  Goal of LDL less than 100.       Signed, Bettey Mare. Liborio Nixon, ANP, AACC   03/26/2023 4:48 PM      Office [phone] Fax [phone]  Notice: This dictation was prepared with Dragon dictation along with smaller phrase technology. Any transcriptional errors that result from this process are unintentional and may not be corrected upon review.

## 2023-03-26 ENCOUNTER — Ambulatory Visit: Payer: No Typology Code available for payment source | Attending: Adult Health | Admitting: Adult Health

## 2023-03-26 ENCOUNTER — Encounter: Payer: Self-pay | Admitting: Adult Health

## 2023-03-26 VITALS — BP 128/84 | HR 56 | Ht 70.0 in | Wt 302.8 lb

## 2023-03-26 DIAGNOSIS — I712 Thoracic aortic aneurysm, without rupture, unspecified: Secondary | ICD-10-CM | POA: Diagnosis not present

## 2023-03-26 DIAGNOSIS — E7800 Pure hypercholesterolemia, unspecified: Secondary | ICD-10-CM

## 2023-03-26 DIAGNOSIS — R911 Solitary pulmonary nodule: Secondary | ICD-10-CM

## 2023-03-26 DIAGNOSIS — R0789 Other chest pain: Secondary | ICD-10-CM

## 2023-03-26 DIAGNOSIS — I10 Essential (primary) hypertension: Secondary | ICD-10-CM

## 2023-03-26 NOTE — Patient Instructions (Signed)
Medication Instructions:  No Changes *If you need a refill on your cardiac medications before your next appointment, please call your pharmacy*   Lab Work: No Labs If you have labs (blood work) drawn today and your tests are completely normal, you will receive your results only by: MyChart Message (if you have MyChart) OR A paper copy in the mail If you have any lab test that is abnormal or we need to change your treatment, we will call you to review the results.   Testing/Procedures: No Testing   Follow-Up: At [facility], you and your health needs are our priority.  As part of our continuing mission to provide you with exceptional heart care, we have created designated Provider Care Teams.  These Care Teams include your primary Cardiologist (physician) and Advanced Practice Providers (APPs -  Physician Assistants and Nurse Practitioners) who all work together to provide you with the care you need, when you need it.  We recommend signing up for the patient portal called "MyChart".  Sign up information is provided on this After Visit Summary.  MyChart is used to connect with patients for Virtual Visits (Telemedicine).  Patients are able to view lab/test results, encounter notes, upcoming appointments, etc.  Non-urgent messages can be sent to your provider as well.   To learn more about what you can do with MyChart, go to [email or web address].    Your next appointment:   6 month(s)  Provider:   Jonathan Berry, MD     

## 2023-03-28 ENCOUNTER — Ambulatory Visit (INDEPENDENT_AMBULATORY_CARE_PROVIDER_SITE_OTHER): Payer: No Typology Code available for payment source | Admitting: Family Medicine

## 2023-03-28 VITALS — BP 113/69 | HR 60 | Ht 70.0 in | Wt 302.0 lb

## 2023-03-28 DIAGNOSIS — I10 Essential (primary) hypertension: Secondary | ICD-10-CM | POA: Diagnosis not present

## 2023-03-28 NOTE — Progress Notes (Signed)
Patient is here for a recheck of his Blood Pressure

## 2023-04-05 ENCOUNTER — Ambulatory Visit (INDEPENDENT_AMBULATORY_CARE_PROVIDER_SITE_OTHER): Payer: No Typology Code available for payment source | Admitting: Family Medicine

## 2023-04-05 ENCOUNTER — Encounter: Payer: Self-pay | Admitting: Family Medicine

## 2023-04-05 ENCOUNTER — Ambulatory Visit: Payer: No Typology Code available for payment source | Admitting: Family Medicine

## 2023-04-05 VITALS — BP 129/80 | HR 59 | Ht 70.0 in | Wt 302.4 lb

## 2023-04-05 DIAGNOSIS — R042 Hemoptysis: Secondary | ICD-10-CM

## 2023-04-05 DIAGNOSIS — R911 Solitary pulmonary nodule: Secondary | ICD-10-CM

## 2023-04-05 NOTE — Assessment & Plan Note (Signed)
CT scans for pulmonary nodules and enlarged lymph nodes with other nonspecific findings on CT scan.  Pulmonology referral ordered.

## 2023-04-05 NOTE — Assessment & Plan Note (Signed)
Developed a cough after his trip to the ED.  Because of the increased frequency of COVID cases, we will test for COVID.  Sending in pulmonology referral for his CT findings and high risk exposure at work.

## 2023-04-05 NOTE — Progress Notes (Signed)
   Established Patient Office Visit  Subjective   Patient ID: Keith Vance, male    DOB: [birth year]  Age: 62 y.o. MRN: [id number]  Chief Complaint  Patient presents with   Hospitalization Follow-up    HPI Right chst pain -patient here for follow-up from ED visit when he had a "harsh" chest pain.  Has also since coming cardiologist to recommended following up in 4 months.  Did not feel it was cardiac related.  Patient currently not having any chest pain.  Cough-for the past 8 or 9 days patient has had a cough and to yesterday it was with streaks of blood in it.  Has pulmonary nodules seen on his CT scan.  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  Patient would like a pulmonology referral.  Patient not having any symptoms otherwise, no fevers chills, N/V/D.  He is a non-smoker, no history of asthma, only a remote history of acid reflux.   The 10-year ASCVD risk score (Arnett DK, et al., 2019) is: 8.8%  Health Maintenance Due  Topic Date Due   Zoster Vaccines- Shingrix (1 of 2) Never done   COVID-19 Vaccine (4 - 2023-24 season) 04/28/2022   INFLUENZA VACCINE  03/29/2023      Objective:     BP 129/80   Pulse (!) 59   Ht 5\' 10"  (1.778 m)   Wt (!) 302 lb 6.4 oz (137.2 kg)   SpO2 93%   BMI 43.39 kg/m    Physical Exam General: Alert and oriented CV: Regular rate and rhythm no murmurs Pulmonary: Lungs clear bilaterally.  No wheezes or crackles MSK: No chest wall tenderness.   No results found for any visits on 04/05/23.      Assessment & Plan:   Pulmonary nodule Assessment & Plan: CT scans for pulmonary nodules and enlarged lymph nodes with other nonspecific findings on CT scan.  Pulmonology referral ordered.  Orders: -     Ambulatory referral to Pulmonology  Hemoptysis Assessment & Plan: Developed a cough after his trip to the ED.  Because of the increased frequency of COVID cases, we will test for  COVID.  Sending in pulmonology referral for his CT findings and high risk exposure at work.  Orders: -     Novel Coronavirus, NAA ([facility])     Return if symptoms worsen or fail to improve.    Sandre Kitty, MD

## 2023-04-05 NOTE — Patient Instructions (Signed)
It was nice to see you today,  We addressed the following topics today: - if you haven't heard from the pulmonologist in two weeks to schedule an appointment let us know.   - I will let you know the results of your covid test when we get it.     Have a great day,  Frederic Jericho, MD

## 2023-04-09 ENCOUNTER — Other Ambulatory Visit: Payer: Self-pay | Admitting: Family Medicine

## 2023-04-09 DIAGNOSIS — M1712 Unilateral primary osteoarthritis, left knee: Secondary | ICD-10-CM

## 2023-04-25 ENCOUNTER — Ambulatory Visit (INDEPENDENT_AMBULATORY_CARE_PROVIDER_SITE_OTHER): Payer: No Typology Code available for payment source | Admitting: Pulmonary Disease

## 2023-04-25 ENCOUNTER — Encounter: Payer: Self-pay | Admitting: Pulmonary Disease

## 2023-04-25 VITALS — BP 120/80 | HR 66 | Ht 70.0 in | Wt 302.0 lb

## 2023-04-25 DIAGNOSIS — I7121 Aneurysm of the ascending aorta, without rupture: Secondary | ICD-10-CM

## 2023-04-25 DIAGNOSIS — R599 Enlarged lymph nodes, unspecified: Secondary | ICD-10-CM | POA: Diagnosis not present

## 2023-04-25 NOTE — Progress Notes (Signed)
Synopsis: Referred in August 2024 for adenopathy by Sandre Kitty, MD  Subjective:   PATIENT ID: Keith Vance GENDER: male DOB: 1961-03-07, MRN: [id number]  Chief Complaint  Patient presents with   Consult    Consult on pulm. nodules, cough and mucus mixed with blood.    [name], past medical history of obesity, BMI 43, rheumatoid arthritis, OSA on CPAP, hypertension.  Was seen after having CT imaging of the chest that showed adenopathy that was nonspecific.  Was recommending further evaluation.  Patient is no history of malignancy.  Has a father with a history of bone cancer.  He is a lifelong non-smoker.  He did occasionally smoke a few cigarettes when he was dating a lady that had cigarette use.  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    Past Medical History:  Diagnosis Date   Arthritis    Ascending aortic aneurysm (HCC)    a. 10/2015 CTA chest: 4.4cm aneurysmal dil of Asc Ao-->rec f/u in 1 yr.   Back pain    Chest pain 10/11/2018   Fatty liver    GERD (gastroesophageal reflux disease)    hx   Heart disease    History of kidney stones    Hyperlipidemia    Hypertension    Joint pain    Liver disease    Lower extremity edema    Obesity    Pneumonia    hx   Prediabetes    Rheumatoid arthritis (HCC)    SBO (small bowel obstruction) (HCC)    a. 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   Sleep apnea    cpap     Family History  Problem Relation Age of Onset   Cancer Mother    Hypertension Mother    Hyperlipidemia Mother    Stroke Mother    Heart disease Mother    Depression Mother    Hypertension Father    Diabetes Father    Cancer Father        bone   Hyperlipidemia Father    Heart attack Maternal Grandmother        >45s of age     Past Surgical History:  Procedure Laterality Date   HERNIA REPAIR Left    inguinal, and umbilical   JOINT REPLACEMENT     REVISION TOTAL KNEE ARTHROPLASTY Left 07/30/2017   LEFT TOTAL KNEE  PARTIAL REVISION AND SYNOVECTOMY/notes 07/30/2017   TOTAL KNEE ARTHROPLASTY Left 04/24/2016   Procedure: LEFT TOTAL KNEE ARTHROPLASTY;  Surgeon: Durene Romans, MD;  Location: [facility];  Service: Orthopedics;  Laterality: Left;   TOTAL KNEE REVISION Left 07/30/2017   Procedure: LEFT TOTAL KNEE PARTIAL REVISION AND SYNOVECTOMY;  Surgeon: Gean Birchwood, MD;  Location: [facility];  Service: Orthopedics;  Laterality: Left;    Social History   Socioeconomic History   Marital status: Married    Spouse name: Not on file   Number of children: Not on file   Years of education: Not on file   Highest education level: Not on file  Occupational History   Occupation: welder fabicator  Tobacco Use   Smoking status: Never    Passive exposure: Never   Smokeless tobacco: Never   Tobacco comments:    off and on as a teenager - very little per patient   Vaping Use   Vaping status: Never Used  Substance and Sexual Activity   Alcohol use: No   Drug use: No  Sexual activity: Yes  Other Topics Concern   Not on file  Social History Narrative   Not on file   Social Determinants of Health   Financial Resource Strain: Not on file  Food Insecurity: Not on file  Transportation Needs: Not on file  Physical Activity: Not on file  Stress: Not on file  Social Connections: Not on file  Intimate Partner Violence: Not on file     No Known Allergies   Outpatient Medications Prior to Visit  Medication Sig Dispense Refill   amLODipine (NORVASC) 10 MG tablet TAKE 1 TABLET(10 MG) BY MOUTH DAILY 90 tablet 3   aspirin EC 81 MG EC tablet Take 1 tablet (81 mg total) by mouth daily. 30 tablet 0   atorvastatin (LIPITOR) 40 MG tablet TAKE 1 TABLET(40 MG) BY MOUTH DAILY 90 tablet 0   carvedilol (COREG) 12.5 MG tablet TAKE 1 TABLET(12.5 MG) BY MOUTH TWICE DAILY WITH A MEAL 180 tablet 0   Docusate Calcium (STOOL SOFTENER PO) Take by mouth.     eszopiclone (LUNESTA) 1 MG TABS tablet Take 1 tablet (1 mg total) by mouth at  bedtime as needed for sleep. Take immediately before bedtime 30 tablet 1   fluticasone (FLONASE) 50 MCG/ACT nasal spray Place 2 sprays into both nostrils daily. 16 g 6   losartan-hydrochlorothiazide (HYZAAR) 100-25 MG tablet Take 1 tablet by mouth daily. 90 tablet 1   meloxicam (MOBIC) 7.5 MG tablet TAKE 1 TABLET(7.5 MG) BY MOUTH DAILY 30 tablet 1   Multiple Vitamins-Minerals (MULTIVITAMIN WITH MINERALS) tablet Take 1 tablet by mouth daily.     Omega-3 Fatty Acids (FISH OIL) 1200 MG CAPS Take 1,200 mg by mouth daily.     sildenafil (VIAGRA) 25 MG tablet TAKE 1 TABLET(25 MG) BY MOUTH DAILY AS NEEDED FOR ERECTILE DYSFUNCTION 10 tablet 0   Vitamin D, Ergocalciferol, (DRISDOL) 1.25 MG (50000 UNIT) CAPS capsule TAKE 1 CAPSULE BY MOUTH WEEKLY (Patient taking differently: Take 2,000 Units by mouth daily. TAKE 1 CAPSULE BY MOUTH WEEKLY) 4 capsule 0   No facility-administered medications prior to visit.    Review of Systems  Constitutional:  Negative for chills, fever, malaise/fatigue and weight loss.  HENT:  Negative for hearing loss, sore throat and tinnitus.   Eyes:  Negative for blurred vision and double vision.  Respiratory:  Negative for cough, hemoptysis, sputum production, shortness of breath, wheezing and stridor.   Cardiovascular:  Negative for chest pain, palpitations, orthopnea, leg swelling and PND.  Gastrointestinal:  Negative for abdominal pain, constipation, diarrhea, heartburn, nausea and vomiting.  Genitourinary:  Negative for dysuria, hematuria and urgency.  Musculoskeletal:  Negative for joint pain and myalgias.  Skin:  Negative for itching and rash.  Neurological:  Negative for dizziness, tingling, weakness and headaches.  Endo/Heme/Allergies:  Negative for environmental allergies. Does not bruise/bleed easily.  Psychiatric/Behavioral:  Negative for depression. The patient is not nervous/anxious and does not have insomnia.   All other systems reviewed and are  negative.    Objective:  Physical Exam Vitals reviewed.  Constitutional:      General: He is not in acute distress.    Appearance: He is well-developed. He is obese.  HENT:     Head: Normocephalic and atraumatic.  Eyes:     General: No scleral icterus.    Conjunctiva/sclera: Conjunctivae normal.     Pupils: Pupils are equal, round, and reactive to light.  Neck:     Vascular: No JVD.     Trachea: No tracheal  deviation.  Cardiovascular:     Rate and Rhythm: Normal rate and regular rhythm.     Heart sounds: Normal heart sounds. No murmur heard. Pulmonary:     Effort: Pulmonary effort is normal. No tachypnea, accessory muscle usage or respiratory distress.     Breath sounds: No stridor. No wheezing, rhonchi or rales.  Abdominal:     General: There is no distension.     Palpations: Abdomen is soft.     Tenderness: There is no abdominal tenderness.  Musculoskeletal:        General: No tenderness.     Cervical back: Neck supple.  Lymphadenopathy:     Cervical: No cervical adenopathy.  Skin:    General: Skin is warm and dry.     Capillary Refill: Capillary refill takes less than 2 seconds.     Findings: No rash.  Neurological:     Mental Status: He is alert and oriented to person, place, and time.  Psychiatric:        Behavior: Behavior normal.      Vitals:   04/25/23 1549  BP: 120/80  Pulse: 66  SpO2: 94%  Weight: (!) 302 lb (137 kg)  Height: 5\' 10"  (1.778 m)   94% on RA BMI Readings from Last 3 Encounters:  04/25/23 43.33 kg/m  04/05/23 43.39 kg/m  03/28/23 43.33 kg/m   Wt Readings from Last 3 Encounters:  04/25/23 (!) 302 lb (137 kg)  04/05/23 (!) 302 lb 6.4 oz (137.2 kg)  03/28/23 (!) 302 lb (137 kg)     CBC    Component Value Date/Time   WBC 7.3 03/23/2023 0619   RBC 4.81 03/23/2023 0619   HGB 15.2 03/23/2023 0619   HGB 16.1 02/05/2023 0859   HCT 44.4 03/23/2023 0619   HCT 47.5 02/05/2023 0859   PLT 146 (L) 03/23/2023 0619   PLT 150  02/05/2023 0859   MCV 92.3 03/23/2023 0619   MCV 93 02/05/2023 0859   MCH 31.6 03/23/2023 0619   MCHC 34.2 03/23/2023 0619   RDW 13.4 03/23/2023 0619   RDW 13.1 02/05/2023 0859   LYMPHSABS 2.4 02/05/2023 0859   MONOABS 0.9 02/16/2021 0949   EOSABS 0.3 02/05/2023 0859   BASOSABS 0.1 02/05/2023 0859     Chest Imaging:  03/23/2023 CTA chest Patient has nonspecific adenopathy within the mediastinum paratracheal and pericardial lymph nodes. The patient's images have been independently reviewed by me.    Pulmonary Functions Testing Results:     No data to display          FeNO:   Pathology:   Echocardiogram:   Heart Catheterization:     Assessment & Plan:     ICD-10-CM   1. Adenopathy  R59.9 NM PET Image Initial (PI) Skull Base To Thigh (F-18 FDG)    2. Aneurysm of ascending aorta without rupture (HCC)  I71.21     3. Morbid obesity (HCC)  E66.01       Discussion:  [name] lifelong non-smoker, no history of malignancy, father with a history of bone cancer Works as a Psychologist, occupational.  Has nonspecific adenopathy within the chest on CT.  Plan: I have ordered nuclear medicine pet imaging for further evaluation of the lymph nodes. I suspect this is the next best step. This will help [place] decide whether or not we need to consider biopsy of the lymph nodes. I think if there are PET avid we should move forward with video bronchoscopy endobronchial ultrasound. Patient  is agreeable to this plan. Will go to order the PET scan and have him see [place] back quickly after the PET is complete.   Current Outpatient Medications:    amLODipine (NORVASC) 10 MG tablet, TAKE 1 TABLET(10 MG) BY MOUTH DAILY, Disp: 90 tablet, Rfl: 3   aspirin EC 81 MG EC tablet, Take 1 tablet (81 mg total) by mouth daily., Disp: 30 tablet, Rfl: 0   atorvastatin (LIPITOR) 40 MG tablet, TAKE 1 TABLET(40 MG) BY MOUTH DAILY, Disp: 90 tablet, Rfl: 0   carvedilol (COREG) 12.5 MG tablet, TAKE 1  TABLET(12.5 MG) BY MOUTH TWICE DAILY WITH A MEAL, Disp: 180 tablet, Rfl: 0   Docusate Calcium (STOOL SOFTENER PO), Take by mouth., Disp: , Rfl:    eszopiclone (LUNESTA) 1 MG TABS tablet, Take 1 tablet (1 mg total) by mouth at bedtime as needed for sleep. Take immediately before bedtime, Disp: 30 tablet, Rfl: 1   fluticasone (FLONASE) 50 MCG/ACT nasal spray, Place 2 sprays into both nostrils daily., Disp: 16 g, Rfl: 6   losartan-hydrochlorothiazide (HYZAAR) 100-25 MG tablet, Take 1 tablet by mouth daily., Disp: 90 tablet, Rfl: 1   meloxicam (MOBIC) 7.5 MG tablet, TAKE 1 TABLET(7.5 MG) BY MOUTH DAILY, Disp: 30 tablet, Rfl: 1   Multiple Vitamins-Minerals (MULTIVITAMIN WITH MINERALS) tablet, Take 1 tablet by mouth daily., Disp: , Rfl:    Omega-3 Fatty Acids (FISH OIL) 1200 MG CAPS, Take 1,200 mg by mouth daily., Disp: , Rfl:    sildenafil (VIAGRA) 25 MG tablet, TAKE 1 TABLET(25 MG) BY MOUTH DAILY AS NEEDED FOR ERECTILE DYSFUNCTION, Disp: 10 tablet, Rfl: 0   Vitamin D, Ergocalciferol, (DRISDOL) 1.25 MG (50000 UNIT) CAPS capsule, TAKE 1 CAPSULE BY MOUTH WEEKLY (Patient taking differently: Take 2,000 Units by mouth daily. TAKE 1 CAPSULE BY MOUTH WEEKLY), Disp: 4 capsule, Rfl: 0   Josephine Igo, DO [facility] 04/25/2023 4:12 PM

## 2023-04-25 NOTE — Patient Instructions (Addendum)
Thank you for visiting Dr. Tonia Brooms at [facility]. Today we recommend the following:  Orders Placed This Encounter  Procedures   NM PET Image Initial (PI) Skull Base To Thigh (F-18 FDG)   Return in about 3 weeks (around 05/16/2023) for with Kandice Robinsons, NP, or Dr. Tonia Brooms, after PET/CT Chest.    Please do your part to reduce the spread of COVID-19.

## 2023-05-08 ENCOUNTER — Encounter: Payer: Self-pay | Admitting: Pulmonary Disease

## 2023-05-09 NOTE — Telephone Encounter (Signed)
PT wife now calling. Would still like to have the PET done. Please call to advise what price would be if they pay cash. (I assume that's what they are wanting to know.) Her # is [phone]

## 2023-05-10 ENCOUNTER — Encounter (HOSPITAL_COMMUNITY): Payer: No Typology Code available for payment source

## 2023-05-11 NOTE — Telephone Encounter (Signed)
[name] wife checking on message for PET scan. [name] phone number is [phone].

## 2023-05-23 ENCOUNTER — Encounter: Payer: Self-pay | Admitting: Family Medicine

## 2023-05-23 DIAGNOSIS — N50811 Right testicular pain: Secondary | ICD-10-CM

## 2023-05-30 ENCOUNTER — Ambulatory Visit: Payer: No Typology Code available for payment source | Admitting: Primary Care

## 2023-06-11 ENCOUNTER — Encounter (HOSPITAL_COMMUNITY)
Admission: RE | Admit: 2023-06-11 | Discharge: 2023-06-11 | Disposition: A | Discharge status: Home / Self Care | Payer: No Typology Code available for payment source | Source: Ambulatory Visit | Attending: Pulmonary Disease | Admitting: Pulmonary Disease

## 2023-06-11 DIAGNOSIS — R599 Enlarged lymph nodes, unspecified: Secondary | ICD-10-CM | POA: Insufficient documentation

## 2023-06-11 LAB — GLUCOSE, CAPILLARY: Glucose-Capillary: 106 mg/dL — ABNORMAL HIGH (ref 70–99)

## 2023-06-11 MED ORDER — FLUDEOXYGLUCOSE F - 18 (FDG) INJECTION
15.6200 | Freq: Once | INTRAVENOUS | Status: AC
  Administered 2023-06-11: 15.62 via INTRAVENOUS

## 2023-06-12 ENCOUNTER — Other Ambulatory Visit: Payer: Self-pay | Admitting: Family Medicine

## 2023-06-12 DIAGNOSIS — E785 Hyperlipidemia, unspecified: Secondary | ICD-10-CM

## 2023-06-20 NOTE — Progress Notes (Signed)
Appt with TP coming up. Nodes are stable. We need to continue to watch.   Thanks,  BLI  Josephine Igo, DO [facility] 06/20/2023 9:42 AM

## 2023-07-02 ENCOUNTER — Other Ambulatory Visit: Payer: Self-pay

## 2023-07-02 DIAGNOSIS — I10 Essential (primary) hypertension: Secondary | ICD-10-CM

## 2023-07-02 DIAGNOSIS — E785 Hyperlipidemia, unspecified: Secondary | ICD-10-CM

## 2023-07-02 DIAGNOSIS — R7303 Prediabetes: Secondary | ICD-10-CM

## 2023-07-10 ENCOUNTER — Other Ambulatory Visit: Payer: PRIVATE HEALTH INSURANCE

## 2023-07-13 ENCOUNTER — Encounter: Payer: Self-pay | Admitting: Adult Health

## 2023-07-13 ENCOUNTER — Telehealth (INDEPENDENT_AMBULATORY_CARE_PROVIDER_SITE_OTHER): Payer: No Typology Code available for payment source | Admitting: Adult Health

## 2023-07-13 ENCOUNTER — Encounter: Payer: Self-pay | Admitting: Family Medicine

## 2023-07-13 VITALS — HR 74

## 2023-07-13 DIAGNOSIS — R053 Chronic cough: Secondary | ICD-10-CM

## 2023-07-13 DIAGNOSIS — R918 Other nonspecific abnormal finding of lung field: Secondary | ICD-10-CM

## 2023-07-13 DIAGNOSIS — R059 Cough, unspecified: Secondary | ICD-10-CM | POA: Diagnosis not present

## 2023-07-13 DIAGNOSIS — R062 Wheezing: Secondary | ICD-10-CM

## 2023-07-13 DIAGNOSIS — R0602 Shortness of breath: Secondary | ICD-10-CM

## 2023-07-13 DIAGNOSIS — R590 Localized enlarged lymph nodes: Secondary | ICD-10-CM

## 2023-07-13 DIAGNOSIS — G4733 Obstructive sleep apnea (adult) (pediatric): Secondary | ICD-10-CM | POA: Diagnosis not present

## 2023-07-13 DIAGNOSIS — M2550 Pain in unspecified joint: Secondary | ICD-10-CM

## 2023-07-13 MED ORDER — ALBUTEROL SULFATE HFA 108 (90 BASE) MCG/ACT IN AERS: 1.0000 | INHALATION_SPRAY | Freq: Four times a day (QID) | RESPIRATORY_TRACT | 2 refills | Status: DC | PRN

## 2023-07-13 NOTE — Progress Notes (Signed)
Virtual Visit via Video Note  I connected with Keith Vance on 07/13/23 at  9:00 AM EST by a video enabled telemedicine application and verified that I am speaking with the correct person using two identifiers.  Location: Patient: Home  Provider: Office    I discussed the limitations of evaluation and management by telemedicine and the availability of in person appointments. The patient expressed understanding and agreed to proceed.  History of Present Illness: 62 year old male never smoker seen for pulmonary consult April 25, 2023 for abnormal CT chest with adenopathy Medical history significant sleep apnea on CPAP  Occupational history welder Family history positive for bone cancer  Today's video visit is a 37-month follow-up.  Patient was seen April 25, 2023 for an abnormal CT chest 03/23/2023  that showed nonspecific adenopathy within the mediastinum, paratracheal and.  pericardial lymph nodes.,  Scattered small pulmonary nodules, hazy parenchymal densities throughout both lungs with small reticular densities in the upper lungs.  Patient is followed by cardiology for known thoracic aortic aneurysm followed on serial CT angio chest.  Patient was set up for a PET scan that was completed on June 11, 2023 that showed mildly hypermetabolic cervical, thoracic, upper abdominal lymphadenopathy the appearance is grossly unchanged from 2022 possible benign/reactive etiology.  No suspicious pulmonary nodules.  Spleen is normal size.  No hypermetabolic skeletal activity. We reviewed his CT and PET scan findings in detail.  Patient says he continues to have ongoing cough and wheezing.  Has some shortness of breath.  Also has significant joint pain in bilateral knees and hands.  No history of sarcoidosis.  Son does have a history of hemochromatosis. No hemoptysis, rash, unintentional weight loss. Patient is a never smoker.  No known history of asthma or COPD.  Patient does have sleep apnea.  Is on  nocturnal CPAP.  Says no one is managing his CPAP.  Says he cannot sleep without it feels he benefits from CPAP.  Download was obtained and shows excellent compliance with 100% usage.  Daily average usage at 7 hours.  Patient is on auto CPAP 4 to 20 cm H2O.  AHI 3.1/hour.  Daily average pressure at 14.1 cm H2O.    Past Medical History:  Diagnosis Date   Arthritis    Ascending aortic aneurysm (HCC)    a. 10/2015 CTA chest: 4.4cm aneurysmal dil of Asc Ao-->rec f/u in 1 yr.   Back pain    Chest pain 10/11/2018   Fatty liver    GERD (gastroesophageal reflux disease)    hx   Heart disease    History of kidney stones    Hyperlipidemia    Hypertension    Joint pain    Liver disease    Lower extremity edema    Obesity    Pneumonia    hx   Prediabetes    Rheumatoid arthritis (HCC)    SBO (small bowel obstruction) (HCC)    a. 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   Sleep apnea    cpap    Current Outpatient Medications on File Prior to Visit  Medication Sig Dispense Refill   amLODipine (NORVASC) 10 MG tablet TAKE 1 TABLET(10 MG) BY MOUTH DAILY 90 tablet 3   aspirin EC 81 MG EC tablet Take 1 tablet (81 mg total) by mouth daily. 30 tablet 0   atorvastatin (LIPITOR) 40 MG tablet TAKE 1 TABLET(40 MG) BY MOUTH DAILY 90 tablet 0   carvedilol (COREG) 12.5 MG tablet TAKE 1 TABLET(12.5 MG) BY MOUTH TWICE DAILY  WITH A MEAL 180 tablet 0   Docusate Calcium (STOOL SOFTENER PO) Take by mouth.     eszopiclone (LUNESTA) 1 MG TABS tablet Take 1 tablet (1 mg total) by mouth at bedtime as needed for sleep. Take immediately before bedtime 30 tablet 1   fluticasone (FLONASE) 50 MCG/ACT nasal spray Place 2 sprays into both nostrils daily. 16 g 6   losartan-hydrochlorothiazide (HYZAAR) 100-25 MG tablet Take 1 tablet by mouth daily. 90 tablet 1   meloxicam (MOBIC) 7.5 MG tablet TAKE 1 TABLET(7.5 MG) BY MOUTH DAILY 30 tablet 1   Multiple Vitamins-Minerals (MULTIVITAMIN WITH MINERALS) tablet Take 1 tablet by mouth daily.      Omega-3 Fatty Acids (FISH OIL) 1200 MG CAPS Take 1,200 mg by mouth daily.     sildenafil (VIAGRA) 25 MG tablet TAKE 1 TABLET(25 MG) BY MOUTH DAILY AS NEEDED FOR ERECTILE DYSFUNCTION 10 tablet 0   No current facility-administered medications on file prior to visit.          Observations/Objective: 03/23/2023 CTA chest Patient has nonspecific adenopathy within the mediastinum paratracheal and pericardial lymph nodes.  Appears in no acute distress.  Assessment and Plan: Thoracic and upper abdomen lymphadenopathy-mildly hypermetabolic on PET scan-no significant change from 2022.  Will need ongoing serial follow-up. Question if this could be underlying sarcoidosis.  Has a constellation of symptoms with cough shortness of breath joint pain -along with hazy densities bilaterally with upper lobe predominance. Will have patient return for PFTs.  Consider ACE level and LFT  on return visit. Would recommend follow-up CT chest with contrast in 6 months.  Patient does have a yearly follow-up for thoracic aortic aneurysm with cardiology-could coordinate imaging timing.  Cough and Wheezing -Check PFT  Add Delsym and Zyrtec  Trial of Albuterol inhaler As needed    Obstructive sleep apnea.  Patient has excellent control compliance on CPAP.  Continue with current settings.  Plan  Patient Instructions  Begin Delsym 2 tsp Twice daily   Albuterol inhaler 1-2 puffs every 6hr As needed  cough/wheezing.  Begin Zyrtec 10mg  At bedtime As needed  drainage   Continue on CPAP At bedtime   CPAP download from [place] .  Work on healthy weight loss  Do not drive if sleepy   CT chest as planned with Cardiology .   Follow up in 6 weeks with Dr. Tonia Brooms or Keith Montanye NP with [facility] - [address] office .         Follow Up Instructions:    I discussed the assessment and treatment plan with the patient. The patient was provided an opportunity to ask questions and all were answered. The patient agreed with the  plan and demonstrated an understanding of the instructions.   The patient was advised to call back or seek an in-person evaluation if the symptoms worsen or if the condition fails to improve as anticipated.  I provided 30  minutes of non-face-to-face time during this encounter.   Rubye Oaks, NP

## 2023-07-13 NOTE — Patient Instructions (Addendum)
Begin Delsym 2 tsp Twice daily   Albuterol inhaler 1-2 puffs every 6hr As needed  cough/wheezing.  Begin Zyrtec 10mg  At bedtime As needed  drainage   Continue on CPAP At bedtime   CPAP download from [place] .  Work on healthy weight loss  Do not drive if sleepy   CT chest as planned with Cardiology .   Follow up in 6 weeks with Dr. Tonia Brooms or Torence Palmeri NP with [facility] - [address] office .

## 2023-07-16 ENCOUNTER — Ambulatory Visit: Payer: PRIVATE HEALTH INSURANCE | Admitting: Family Medicine

## 2023-07-17 ENCOUNTER — Ambulatory Visit: Payer: No Typology Code available for payment source | Admitting: Adult Health

## 2023-08-14 ENCOUNTER — Other Ambulatory Visit: Payer: Self-pay

## 2023-08-14 DIAGNOSIS — E785 Hyperlipidemia, unspecified: Secondary | ICD-10-CM

## 2023-08-14 DIAGNOSIS — I10 Essential (primary) hypertension: Secondary | ICD-10-CM

## 2023-08-14 DIAGNOSIS — R7303 Prediabetes: Secondary | ICD-10-CM

## 2023-08-30 ENCOUNTER — Other Ambulatory Visit: Payer: No Typology Code available for payment source

## 2023-08-30 DIAGNOSIS — E785 Hyperlipidemia, unspecified: Secondary | ICD-10-CM

## 2023-08-30 DIAGNOSIS — I10 Essential (primary) hypertension: Secondary | ICD-10-CM

## 2023-08-30 DIAGNOSIS — R7303 Prediabetes: Secondary | ICD-10-CM

## 2023-08-31 LAB — CBC WITH DIFFERENTIAL/PLATELET
Basophils Absolute: 0.1 10*3/uL (ref 0.0–0.2)
Basos: 2 %
EOS (ABSOLUTE): 0.7 10*3/uL — ABNORMAL HIGH (ref 0.0–0.4)
Eos: 8 %
Hematocrit: 45.8 % (ref 37.5–51.0)
Hemoglobin: 15.2 g/dL (ref 13.0–17.7)
Immature Grans (Abs): 0 10*3/uL (ref 0.0–0.1)
Immature Granulocytes: 0 %
Lymphocytes Absolute: 2.7 10*3/uL (ref 0.7–3.1)
Lymphs: 33 %
MCH: 32.3 pg (ref 26.6–33.0)
MCHC: 33.2 g/dL (ref 31.5–35.7)
MCV: 97 fL (ref 79–97)
Monocytes Absolute: 0.7 10*3/uL (ref 0.1–0.9)
Monocytes: 8 %
Neutrophils Absolute: 4 10*3/uL (ref 1.4–7.0)
Neutrophils: 49 %
Platelets: 180 10*3/uL (ref 150–450)
RBC: 4.7 x10E6/uL (ref 4.14–5.80)
RDW: 13.1 % (ref 11.6–15.4)
WBC: 8.1 10*3/uL (ref 3.4–10.8)

## 2023-08-31 LAB — COMPREHENSIVE METABOLIC PANEL
ALT: 83 [IU]/L — ABNORMAL HIGH (ref 0–44)
AST: 74 [IU]/L — ABNORMAL HIGH (ref 0–40)
Albumin: 4.2 g/dL (ref 3.9–4.9)
Alkaline Phosphatase: 68 [IU]/L (ref 44–121)
BUN/Creatinine Ratio: 18 (ref 10–24)
BUN: 21 mg/dL (ref 8–27)
Bilirubin Total: 0.9 mg/dL (ref 0.0–1.2)
CO2: 23 mmol/L (ref 20–29)
Calcium: 8.9 mg/dL (ref 8.6–10.2)
Chloride: 101 mmol/L (ref 96–106)
Creatinine, Ser: 1.15 mg/dL (ref 0.76–1.27)
Globulin, Total: 3.3 g/dL (ref 1.5–4.5)
Glucose: 101 mg/dL — ABNORMAL HIGH (ref 70–99)
Potassium: 4.3 mmol/L (ref 3.5–5.2)
Sodium: 141 mmol/L (ref 134–144)
Total Protein: 7.5 g/dL (ref 6.0–8.5)
eGFR: 72 mL/min/{1.73_m2} (ref >59)

## 2023-08-31 LAB — LIPID PANEL
Chol/HDL Ratio: 2.8 {ratio} (ref 0.0–5.0)
Cholesterol, Total: 154 mg/dL (ref 100–199)
HDL: 56 mg/dL (ref >39)
LDL Chol Calc (NIH): 75 mg/dL (ref 0–99)
Triglycerides: 134 mg/dL (ref 0–149)
VLDL Cholesterol Cal: 23 mg/dL (ref 5–40)

## 2023-08-31 LAB — HEMOGLOBIN A1C
Est. average glucose Bld gHb Est-mCnc: 117 mg/dL
Hgb A1c MFr Bld: 5.7 % — ABNORMAL HIGH (ref 4.8–5.6)

## 2023-09-06 ENCOUNTER — Ambulatory Visit (INDEPENDENT_AMBULATORY_CARE_PROVIDER_SITE_OTHER): Payer: No Typology Code available for payment source | Admitting: Family Medicine

## 2023-09-06 ENCOUNTER — Encounter: Payer: Self-pay | Admitting: Family Medicine

## 2023-09-06 VITALS — BP 151/94 | HR 68 | Resp 18 | Ht 70.0 in | Wt 310.0 lb

## 2023-09-06 DIAGNOSIS — R7303 Prediabetes: Secondary | ICD-10-CM

## 2023-09-06 DIAGNOSIS — I10 Essential (primary) hypertension: Secondary | ICD-10-CM

## 2023-09-06 DIAGNOSIS — K760 Fatty (change of) liver, not elsewhere classified: Secondary | ICD-10-CM | POA: Diagnosis not present

## 2023-09-06 DIAGNOSIS — E785 Hyperlipidemia, unspecified: Secondary | ICD-10-CM

## 2023-09-06 MED ORDER — BENZONATATE 200 MG PO CAPS: 200.0000 mg | ORAL_CAPSULE | Freq: Two times a day (BID) | ORAL | 1 refills | Status: DC | PRN

## 2023-09-06 MED ORDER — CARVEDILOL 12.5 MG PO TABS: ORAL_TABLET | ORAL | 1 refills | Status: DC

## 2023-09-06 MED ORDER — AMLODIPINE BESYLATE 10 MG PO TABS: ORAL_TABLET | ORAL | 1 refills | Status: DC

## 2023-09-06 MED ORDER — LOSARTAN POTASSIUM-HCTZ 100-25 MG PO TABS: 1.0000 | ORAL_TABLET | Freq: Every day | ORAL | 1 refills | Status: DC

## 2023-09-06 MED ORDER — ATORVASTATIN CALCIUM 40 MG PO TABS: 40.0000 mg | ORAL_TABLET | Freq: Every day | ORAL | 1 refills | Status: DC

## 2023-09-06 NOTE — Patient Instructions (Addendum)
 THROAT PAIN: Sip warm liquids, such as broth, herbal tea, or warm water . Eat or drink cold or frozen liquids, such as frozen ice pops. Rinse your mouth (gargle) with a salt water  mixture 3-4 times a day or as needed. To make salt water , dissolve -1 tsp (3-6 g) of salt in 1 cup (237 mL) of warm water . Do not swallow this mixture. Suck on hard candy or throat lozenges. Put a cool-mist humidifier in your bedroom at night. Sit in the bathroom with the door closed for 5-10 minutes while you run hot water  in the shower. You may take acetaminophen  (Tylenol ) 500 mg up to three times each day.  DISABILITY PLACARD: Go to the following website, fill out the top section, and bring the form to our office to fill out the rest. [email or web address]

## 2023-09-06 NOTE — Assessment & Plan Note (Signed)
 A1c has improved to 5.7 and 6.1. Will continue to monitor.

## 2023-09-06 NOTE — Assessment & Plan Note (Addendum)
 BP goal <130/80.  Blood pressure remains above goal during office visit again today, suspect likely related to pain and discomfort.  Continue losartan -HCTZ 100-25 mg daily, amlodipine  10 mg daily, carvedilol  12.5 mg daily.  Continue ambulatory blood pressure monitoring.  Hopeful that blood pressure will decrease with pain control and treatment of lung condition after pulmonology follow-up and updated plan.

## 2023-09-06 NOTE — Progress Notes (Signed)
 Established Patient Office Visit  Subjective   Patient ID: Keith Vance, male    DOB: 1961-04-04  Age: 63 y.o. MRN: [id number]  Chief Complaint  Patient presents with   Hyperlipidemia   Hypertension    HPI Keith Vance is a 64 y.o. male presenting today for follow up of hypertension, hyperlipidemia, prediabetes.  He continues to experience significant discomfort due to constant cough.  He has a follow-up scheduled with pulmonology within the next few months to determine next steps and repeat imaging.  The back of his throat is very sore from constant coughing.  He coughs up clear mucus at times. Hypertension:   Pt denies chest pain, SOB, dizziness, edema, syncope, fatigue or heart palpitations. Taking losartan -HCTZ 100-25 mg daily, amlodipine  10 mg daily, carvedilol  12.5 mg daily, reports excellent compliance with treatment. Denies side effects. Hyperlipidemia: tolerating atorvastatin  well with no myalgias or significant side effects. The 10-year ASCVD risk score (Arnett DK, et al., 2019) is: 10.7% Prediabetes: denies hypoglycemic events, wounds or sores that are not healing well, increased thirst or urination.   Outpatient Medications Prior to Visit  Medication Sig   albuterol  (VENTOLIN  HFA) 108 (90 Base) MCG/ACT inhaler Inhale 1-2 puffs into the lungs every 6 (six) hours as needed.   aspirin  EC 81 MG EC tablet Take 1 tablet (81 mg total) by mouth daily.   Docusate Calcium  (STOOL SOFTENER PO) Take by mouth.   eszopiclone  (LUNESTA ) 1 MG TABS tablet Take 1 tablet (1 mg total) by mouth at bedtime as needed for sleep. Take immediately before bedtime   fluticasone  (FLONASE ) 50 MCG/ACT nasal spray Place 2 sprays into both nostrils daily.   meloxicam  (MOBIC ) 7.5 MG tablet TAKE 1 TABLET(7.5 MG) BY MOUTH DAILY   Multiple Vitamins-Minerals (MULTIVITAMIN WITH MINERALS) tablet Take 1 tablet by mouth daily.   Omega-3 Fatty Acids (FISH OIL) 1200 MG CAPS Take 1,200 mg by mouth daily.    sildenafil  (VIAGRA ) 25 MG tablet TAKE 1 TABLET(25 MG) BY MOUTH DAILY AS NEEDED FOR ERECTILE DYSFUNCTION   [DISCONTINUED] amLODipine  (NORVASC ) 10 MG tablet TAKE 1 TABLET(10 MG) BY MOUTH DAILY   [DISCONTINUED] atorvastatin  (LIPITOR) 40 MG tablet TAKE 1 TABLET(40 MG) BY MOUTH DAILY   [DISCONTINUED] carvedilol  (COREG ) 12.5 MG tablet TAKE 1 TABLET(12.5 MG) BY MOUTH TWICE DAILY WITH A MEAL   [DISCONTINUED] losartan -hydrochlorothiazide  (HYZAAR) 100-25 MG tablet Take 1 tablet by mouth daily.   No facility-administered medications prior to visit.    ROS Negative unless otherwise noted in HPI   Objective:     BP (!) 151/94 (BP Location: Left Arm, Patient Position: Sitting, Cuff Size: Large)   Pulse 68   Resp 18   Ht 5' 10 (1.778 m)   Wt (!) 310 lb (140.6 kg)   SpO2 93%   BMI 44.48 kg/m   Physical Exam Constitutional:      General: He is not in acute distress.    Appearance: Normal appearance.  HENT:     Head: Normocephalic and atraumatic.  Cardiovascular:     Rate and Rhythm: Normal rate and regular rhythm.     Heart sounds: Normal heart sounds. No murmur heard.    No friction rub. No gallop.  Pulmonary:     Effort: Pulmonary effort is normal. No respiratory distress.     Breath sounds: Rhonchi (Scattered throughout lung fields) present. No wheezing or rales.  Skin:    General: Skin is warm and dry.  Neurological:     Mental Status: He  is alert and oriented to person, place, and time.  Psychiatric:        Mood and Affect: Mood normal.      Assessment & Plan:  Essential hypertension Assessment & Plan: BP goal <130/80.  Blood pressure remains above goal during office visit again today, suspect likely related to pain and discomfort.  Continue losartan -HCTZ 100-25 mg daily, amlodipine  10 mg daily, carvedilol  12.5 mg daily.  Continue ambulatory blood pressure monitoring.  Hopeful that blood pressure will decrease with pain control and treatment of lung condition after pulmonology  follow-up and updated plan.  Orders: -     Losartan  Potassium-HCTZ; Take 1 tablet by mouth daily.  Dispense: 90 tablet; Refill: 1 -     Carvedilol ; TAKE 1 TABLET(12.5 MG) BY MOUTH TWICE DAILY WITH A MEAL  Dispense: 180 tablet; Refill: 1 -     amLODIPine  Besylate; TAKE 1 TABLET(10 MG) BY MOUTH DAILY  Dispense: 90 tablet; Refill: 1  Dyslipidemia, goal LDL below 70 Assessment & Plan: Last lipid panel: LDL 75, HDL 56, triglycerides 134.  Continue atorvastatin  40 mg daily.  Will continue to monitor.  Orders: -     Atorvastatin  Calcium ; Take 1 tablet (40 mg total) by mouth daily.  Dispense: 90 tablet; Refill: 1  Pre-diabetes Assessment & Plan: A1c has improved to 5.7 and 6.1. Will continue to monitor.   NAFLD (nonalcoholic fatty liver disease) Assessment & Plan: AST and ALT remain elevated but fairly stable from 6 months ago.  Will continue to monitor.   Other orders -     Benzonatate ; Take 1 capsule (200 mg total) by mouth 2 (two) times daily as needed for cough.  Dispense: 30 capsule; Refill: 1  Recommend warm tea with honey and Tylenol  500 mg up to 3 times daily as needed for sore throat.  Also providing benzonatate  for cough to take particularly at nighttime to help him with improved sleep.  Return in about 4 months (around 01/04/2024) for follow-up for HTN, HLD, preDM, fasting blood work 1 week before.    Joesph DELENA Sear, PA

## 2023-09-06 NOTE — Assessment & Plan Note (Signed)
 AST and ALT remain elevated but fairly stable from 6 months ago.  Will continue to monitor.

## 2023-09-06 NOTE — Assessment & Plan Note (Signed)
 Last lipid panel: LDL 75, HDL 56, triglycerides 134.  Continue atorvastatin 40 mg daily.  Will continue to monitor.

## 2023-09-07 ENCOUNTER — Encounter: Payer: Self-pay | Admitting: Pulmonary Disease

## 2023-09-12 ENCOUNTER — Ambulatory Visit (INDEPENDENT_AMBULATORY_CARE_PROVIDER_SITE_OTHER): Payer: No Typology Code available for payment source | Admitting: Pulmonary Disease

## 2023-09-12 DIAGNOSIS — R059 Cough, unspecified: Secondary | ICD-10-CM | POA: Diagnosis not present

## 2023-09-12 LAB — PULMONARY FUNCTION TEST
DL/VA % pred: 103 %
DL/VA: 4.38 ml/min/mmHg/L
DLCO cor % pred: 90 %
DLCO cor: 24.13 ml/min/mmHg
DLCO unc % pred: 92 %
DLCO unc: 24.53 ml/min/mmHg
FEF 25-75 Post: 2.77 L/s
FEF 25-75 Pre: 2 L/s
FEF2575-%Change-Post: 38 %
FEF2575-%Pred-Post: 97 %
FEF2575-%Pred-Pre: 70 %
FEV1-%Change-Post: 7 %
FEV1-%Pred-Post: 83 %
FEV1-%Pred-Pre: 77 %
FEV1-Post: 2.88 L
FEV1-Pre: 2.67 L
FEV1FVC-%Change-Post: 2 %
FEV1FVC-%Pred-Pre: 99 %
FEV6-%Change-Post: 5 %
FEV6-%Pred-Post: 85 %
FEV6-%Pred-Pre: 81 %
FEV6-Post: 3.74 L
FEV6-Pre: 3.53 L
FEV6FVC-%Change-Post: 0 %
FEV6FVC-%Pred-Post: 104 %
FEV6FVC-%Pred-Pre: 104 %
FVC-%Change-Post: 5 %
FVC-%Pred-Post: 81 %
FVC-%Pred-Pre: 77 %
FVC-Post: 3.74 L
FVC-Pre: 3.55 L
Post FEV1/FVC ratio: 77 %
Post FEV6/FVC ratio: 100 %
Pre FEV1/FVC ratio: 75 %
Pre FEV6/FVC Ratio: 99 %
RV % pred: 139 %
RV: 3.09 L
TLC % pred: 100 %
TLC: 6.86 L

## 2023-09-12 NOTE — Patient Instructions (Signed)
 Full PFT Performed Today

## 2023-09-12 NOTE — Progress Notes (Signed)
 Full PFT Performed Today

## 2023-09-16 ENCOUNTER — Other Ambulatory Visit: Payer: Self-pay | Admitting: Family Medicine

## 2023-09-16 DIAGNOSIS — G4709 Other insomnia: Secondary | ICD-10-CM

## 2023-09-16 DIAGNOSIS — N529 Male erectile dysfunction, unspecified: Secondary | ICD-10-CM

## 2023-09-19 NOTE — Progress Notes (Signed)
Called and spoke with patient, advised of results/recommendations per Rubye Oaks NP, I scheduled a f/u with Tammy on 11/27/23 at 4pm.  Advised to arrive by 3:45 pm for check in.  He verbalized understanding.  Nothing further needed.

## 2023-09-25 NOTE — Telephone Encounter (Signed)
Keith Vance, Patient is asking about PFT results.  His OV is not until April.  Please advise.  Thank you.

## 2023-09-25 NOTE — Telephone Encounter (Signed)
He was called with PFT results , they were normal. If having breathing problems will need ov .  Please see result note from PFT 09/19/23   Please contact office for sooner follow up if symptoms do not improve or worsen or seek emergency care

## 2023-09-27 NOTE — Telephone Encounter (Signed)
Author: Ellin Saba, Ledell Peoples, RN Author Type: Registered Nurse Filed: 09/19/2023  4:59 PM  Note Status: Signed Cosign: Cosign Not Required Encounter Date: 09/12/2023  Editor: Ellin Saba, Ledell Peoples, RN (Registered Nurse)              Jeanene Erb and spoke with patient, advised of results/recommendations per Rubye Oaks NP, I scheduled a f/u with Tammy on 11/27/23 at 4pm.  Advised to arrive by 3:45 pm for check in.  He verbalized understanding.  Nothing further needed

## 2023-10-04 ENCOUNTER — Encounter: Payer: Self-pay | Admitting: Family Medicine

## 2023-11-27 ENCOUNTER — Encounter: Payer: Self-pay | Admitting: Adult Health

## 2023-11-27 ENCOUNTER — Ambulatory Visit (INDEPENDENT_AMBULATORY_CARE_PROVIDER_SITE_OTHER): Payer: No Typology Code available for payment source | Admitting: Adult Health

## 2023-11-27 VITALS — BP 153/90 | HR 64 | Ht 69.0 in | Wt 320.4 lb

## 2023-11-27 DIAGNOSIS — G4733 Obstructive sleep apnea (adult) (pediatric): Secondary | ICD-10-CM

## 2023-11-27 DIAGNOSIS — R590 Localized enlarged lymph nodes: Secondary | ICD-10-CM | POA: Insufficient documentation

## 2023-11-27 DIAGNOSIS — Z6841 Body Mass Index (BMI) 40.0 and over, adult: Secondary | ICD-10-CM

## 2023-11-27 DIAGNOSIS — R599 Enlarged lymph nodes, unspecified: Secondary | ICD-10-CM | POA: Diagnosis not present

## 2023-11-27 DIAGNOSIS — R059 Cough, unspecified: Secondary | ICD-10-CM

## 2023-11-27 DIAGNOSIS — E66813 Obesity, class 3: Secondary | ICD-10-CM | POA: Diagnosis not present

## 2023-11-27 DIAGNOSIS — J45909 Unspecified asthma, uncomplicated: Secondary | ICD-10-CM | POA: Insufficient documentation

## 2023-11-27 DIAGNOSIS — J4520 Mild intermittent asthma, uncomplicated: Secondary | ICD-10-CM

## 2023-11-27 MED ORDER — FLUTICASONE FUROATE-VILANTEROL 100-25 MCG/ACT IN AEPB: 1.0000 | INHALATION_SPRAY | Freq: Every day | RESPIRATORY_TRACT | 5 refills | Status: DC

## 2023-11-27 NOTE — Patient Instructions (Addendum)
 Begin Breo 1 puff daily, rinse after use.  Albuterol inhaler As needed  Begin Delsym 2 tsp Twice daily   Zyrtec 10mg  At bedtime As needed  drainage  Delsym 2 tsp every 12hr as needed for cough.   Continue on CPAP At bedtime   Set up for overnight oximetry test  Adjust CPAP pressure .  Work on healthy weight loss  Do not drive if sleepy   CT chest as planned with Cardiology .   Follow up in 3 months with Dr. Wynona Neat or Demontre Padin NP  and As needed  -30 min slot.

## 2023-11-27 NOTE — Assessment & Plan Note (Addendum)
 Severe obstructive sleep apnea has excellent control compliance on nocturnal CPAP.  Patient has noticed some nocturnal hypoxemia despite CPAP use.  Possible underlying obesity hypoventilation syndrome BMI is 47.  Will set up for an overnight oximetry test on CPAP on room air with CPAP-if this shows nocturnal hypoxemia will need to go to the sleep lab for CPAP titration study.

## 2023-11-27 NOTE — Assessment & Plan Note (Signed)
 Continue to work on healthy weight loss

## 2023-11-27 NOTE — Progress Notes (Signed)
 @Patient  ID: Keith Vance, male    DOB: 10-May-1961, 63 y.o.   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  pericardial lymph nodes.,  Scattered small pulmonary nodules, hazy parenchymal densities throughout both lungs with small reticular densities in the upper lungs.    PET scan that was completed on June 11, 2023 that showed mildly hypermetabolic cervical, thoracic, upper abdominal lymphadenopathy the appearance is grossly unchanged from 2022 possible benign/reactive etiology.  No suspicious pulmonary nodules.  Spleen is normal size.  No hypermetabolic skeletal activity.   11/27/2023 Follow up: Abnormal CT chest, Cough/Wheezing and OSA  Patient returns for a 4 month follow up . Last visit was having increased cough and wheezing. Felt to have a component of reactive airways /mild intermittent asthma started on Albuterol inhaler along with zyrtec and deslym . He is feeling better with 90% improvement . Still has some intermittent wheezing especially at nighttime.  Feels that albuterol really helps quite a bit.  Patient was set up for PFTs that were done on September 12, 2023 that showed minimal restriction and no airflow obstruction.  FEV1 at 83%, ratio 77, FVC 81%, no significant bronchodilator response, DLCO 92%.  Previous CT chest July 2024 showed nonspecific adenopathy and small scattered pulmonary nodules.  PET scan October 2024 showed mildly hypermetabolic lymphadenopathy with appearance grossly unchanged since 2022. No rash .  Patient denies any hemoptysis, unintentional weight loss.  Patient has  severe sleep apnea.  In-lab sleep study October 2022 showed severe sleep apnea with AHI at 79.7/hour and SpO2 low at 69%.  Patient was started on CPAP therapy.  Patient says he wears his CPAP every single night.  Feels he benefits from CPAP with decreased daytime sleepiness.  Does notice that his oxygen levels are low during the night if he checks his O2 sats.  Despite using CPAP all night long.  CPAP download shows excellent compliance with 100% usage.  Daily average usage at 7 hours.  Patient is on auto CPAP 4 to 20 cm H2O.  Daily average pressure 17.4 cm H2O.  AHI 3.7/hour.    No Known Allergies  Immunization History  Administered Date(s) Administered   Hepatitis B, ADULT 09/09/2013, 10/10/2013, 03/10/2014   Influenza Inj Mdck Quad Pf 06/09/2016, 05/17/2018   Influenza,inj,Quad PF,6+ Mos 06/08/2017, 05/29/2019, 05/10/2022   Influenza-Unspecified 05/25/2021   PFIZER(Purple Top)SARS-COV-2 Vaccination 09/18/2019, 10/09/2019, 07/08/2020   Pneumococcal Polysaccharide-23 02/28/2015   Tdap 12/14/2021    Past Medical History:  Diagnosis Date   Arthritis    Ascending aortic aneurysm (HCC)    a. 10/2015 CTA chest: 4.4cm aneurysmal dil of Asc Ao-->rec f/u in 1 yr.   Back pain    Chest pain 10/11/2018   Fatty liver    GERD (gastroesophageal reflux disease)    hx   Heart disease    History of kidney stones    Hyperlipidemia    Hypertension    Joint pain    Liver disease    Lower extremity edema    Obesity    Pneumonia    hx   Prediabetes    Rheumatoid arthritis (  HCC)    SBO (small bowel obstruction) (HCC)    a. 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   Sleep apnea    cpap    Tobacco History: Social History   Tobacco Use  Smoking Status Never   Passive exposure: Never  Smokeless Tobacco Never  Tobacco Comments   off and on as a teenager - very little per patient    Counseling given: Not Answered Tobacco comments: off and on as a teenager - very little per patient    Outpatient Medications  Prior to Visit  Medication Sig Dispense Refill   albuterol (VENTOLIN HFA) 108 (90 Base) MCG/ACT inhaler Inhale 1-2 puffs into the lungs every 6 (six) hours as needed. 8 g 2   amLODipine (NORVASC) 10 MG tablet TAKE 1 TABLET(10 MG) BY MOUTH DAILY 90 tablet 1   aspirin EC 81 MG EC tablet Take 1 tablet (81 mg total) by mouth daily. 30 tablet 0   atorvastatin (LIPITOR) 40 MG tablet Take 1 tablet (40 mg total) by mouth daily. 90 tablet 1   carvedilol (COREG) 12.5 MG tablet TAKE 1 TABLET(12.5 MG) BY MOUTH TWICE DAILY WITH A MEAL 180 tablet 1   Docusate Calcium (STOOL SOFTENER PO) Take by mouth.     eszopiclone (LUNESTA) 1 MG TABS tablet TAKE 1 TABLET(1 MG) BY MOUTH AT BEDTIME AS NEEDED FOR SLEEP 30 tablet 1   fluticasone (FLONASE) 50 MCG/ACT nasal spray Place 2 sprays into both nostrils daily. 16 g 6   losartan-hydrochlorothiazide (HYZAAR) 100-25 MG tablet Take 1 tablet by mouth daily. 90 tablet 1   meloxicam (MOBIC) 7.5 MG tablet TAKE 1 TABLET(7.5 MG) BY MOUTH DAILY 30 tablet 1   Multiple Vitamins-Minerals (MULTIVITAMIN WITH MINERALS) tablet Take 1 tablet by mouth daily.     Omega-3 Fatty Acids (FISH OIL) 1200 MG CAPS Take 1,200 mg by mouth daily.     sildenafil (VIAGRA) 25 MG tablet TAKE 1 TABLET(25 MG) BY MOUTH DAILY AS NEEDED FOR ERECTILE DYSFUNCTION 10 tablet 0   benzonatate (TESSALON) 200 MG capsule Take 1 capsule (200 mg total) by mouth 2 (two) times daily as needed for cough. (Patient not taking: Reported on 11/27/2023) 30 capsule 1   No facility-administered medications prior to visit.     Review of Systems:   Constitutional:   No  weight loss, night sweats,  Fevers, chills,+ fatigue, or  lassitude.  HEENT:   No headaches,  Difficulty swallowing,  Tooth/dental problems, or  Sore throat,                No sneezing, itching, ear ache, nasal congestion, post nasal drip,   CV:  No chest pain,  Orthopnea, PND, +swelling in lower extremities, anasarca, dizziness, palpitations, syncope.   GI   No heartburn, indigestion, abdominal pain, nausea, vomiting, diarrhea, change in bowel habits, loss of appetite, bloody stools.   Resp: No shortness of breath with exertion or at rest.  No excess mucus, no productive cough,  No non-productive cough,  No coughing up of blood.  No change in color of mucus.  No wheezing.  No chest wall deformity  Skin: no rash or lesions.  GU: no dysuria, change in color of urine, no urgency or frequency.  No flank pain, no hematuria   MS:  No joint pain or swelling.  No decreased range of motion.  No back pain.    Physical Exam  BP (!) 153/90   Pulse 64   Ht 5\' 9"  (1.753 m)   Wt [name])  320 lb 6.4 oz (145.3 kg)   SpO2 93%   BMI 47.31 kg/m   GEN: A/Ox3; pleasant , NAD, well nourished    HEENT:  [place]/AT,  NOSE-clear, THROAT-clear, no lesions, no postnasal drip or exudate noted.   NECK:  Supple w/ fair ROM; no JVD; normal carotid impulses w/o bruits; no thyromegaly or nodules palpated; no lymphadenopathy.    RESP  Clear  P & A; w/o, wheezes/ rales/ or rhonchi. no accessory muscle use, no dullness to percussion  CARD:  RRR, no m/r/g, tr peripheral edema, pulses intact, no cyanosis or clubbing.  GI:   Soft & nt; nml bowel sounds; no organomegaly or masses detected.   Musco: Warm bil, no deformities or joint swelling noted.   Neuro: alert, no focal deficits noted.    Skin: Warm, no lesions or rashes    Lab Results:  CBC6   BNP No results found for: "BNP"  ProBNP No results found for: "PROBNP"  Imaging: No results found.  Administration History     None          Latest Ref Rng & Units 09/12/2023    3:08 PM  PFT Results  FVC-Pre L 3.55   FVC-Predicted Pre % 77   FVC-Post L 3.74   FVC-Predicted Post % 81   Pre FEV1/FVC % % 75   Post FEV1/FCV % % 77   FEV1-Pre L 2.67   FEV1-Predicted Pre % 77   FEV1-Post L 2.88   DLCO uncorrected ml/min/mmHg 24.53   DLCO UNC% % 92   DLCO corrected ml/min/mmHg 24.13   DLCO COR %Predicted %  90   DLVA Predicted % 103   TLC L 6.86   TLC % Predicted % 100   RV % Predicted % 139     No results found for: "NITRICOXIDE"      Assessment & Plan:   Sleep apnea Severe obstructive sleep apnea has excellent control compliance on nocturnal CPAP.  Patient has noticed some nocturnal hypoxemia despite CPAP use.  Possible underlying obesity hypoventilation syndrome BMI is 47.  Will set up for an overnight oximetry test on CPAP on room air with CPAP-if this shows nocturnal hypoxemia will need to go to the sleep lab for CPAP titration study.  Lymphadenopathy, mediastinal Abnormal CT chest with nonspecific adenopathy within the mediastinum, paratracheal and pericardial lymph nodes with associated scattered small pulmonary nodules.  PET scan with mildly hypermetabolic adenopathy unchanged from 2022.  No suspicious nodules noted.-Patient gets yearly CT scans with cardiology following thoracic aortic aneurysm.  Will continue to monitor adenopathy and pulmonary nodules on surveillance imaging.  Check ACE level today.  If elevated could consider bronchoscopy /EBUS with tissue sampling ? Sarcoid   Class 3 severe obesity with serious comorbidity and body mass index (BMI) of 40.0 to 44.9 in adult [facility]) Continue to work on healthy weight loss  Asthma Suspected mild intermittent asthma.  Patient with significant improvement in cough or wheezing with albuterol.  Will begin a trial of Breo 1 puff daily.  May use albuterol as needed.  Asthma action plan discussed.  Continue on Zyrtec daily. PFTs show minimum airflow restriction.  No airflow obstruction.  Patient is a never smoker.  Plan  Patient Instructions  Begin Breo 1 puff daily, rinse after use.  Albuterol inhaler As needed  Begin Delsym 2 tsp Twice daily   Zyrtec 10mg  At bedtime As needed  drainage  Delsym 2 tsp every 12hr as needed for cough.   Continue on  CPAP At bedtime   Set up for overnight oximetry test  Adjust CPAP pressure .  Work on  healthy weight loss  Do not drive if sleepy   CT chest as planned with Cardiology .   Follow up in 3 months with Dr. Wynona Neat or Arsh Feutz NP  and As needed  -30 min slot.         Rubye Oaks, NP 11/27/2023

## 2023-11-27 NOTE — Assessment & Plan Note (Signed)
 Abnormal CT chest with nonspecific adenopathy within the mediastinum, paratracheal and pericardial lymph nodes with associated scattered small pulmonary nodules.  PET scan with mildly hypermetabolic adenopathy unchanged from 2022.  No suspicious nodules noted.-Patient gets yearly CT scans with cardiology following thoracic aortic aneurysm.  Will continue to monitor adenopathy and pulmonary nodules on surveillance imaging.  Check ACE level today.  If elevated could consider bronchoscopy /EBUS with tissue sampling ? [place]arcoid

## 2023-11-27 NOTE — Assessment & Plan Note (Signed)
 Suspected mild intermittent asthma.  Patient with significant improvement in cough or wheezing with albuterol.  Will begin a trial of Breo 1 puff daily.  May use albuterol as needed.  Asthma action plan discussed.  Continue on Zyrtec daily. PFTs show minimum airflow restriction.  No airflow obstruction.  Patient is a never smoker.  Plan  Patient Instructions  Begin Breo 1 puff daily, rinse after use.  Albuterol inhaler As needed  Begin Delsym 2 tsp Twice daily   Zyrtec 10mg  At bedtime As needed  drainage  Delsym 2 tsp every 12hr as needed for cough.   Continue on CPAP At bedtime   Set up for overnight oximetry test  Adjust CPAP pressure .  Work on healthy weight loss  Do not drive if sleepy   CT chest as planned with Cardiology .   Follow up in 3 months with Keith Vance or Keith Tersigni NP  and As needed  -30 min slot.

## 2023-11-29 ENCOUNTER — Telehealth: Payer: Self-pay | Admitting: Adult Health

## 2023-11-29 NOTE — Telephone Encounter (Signed)
 disregard

## 2023-12-18 ENCOUNTER — Telehealth: Payer: Self-pay | Admitting: Adult Health

## 2023-12-18 NOTE — Telephone Encounter (Signed)
 Rc'd fax from [employer] for Keith Vance's signature. Will put in her box for signature.,

## 2023-12-19 NOTE — Progress Notes (Signed)
 Office Visit Note  Patient: Keith Vance             Date of Birth: 16-Feb-1961           MRN: [id number]             PCP: Noreene Bearded, PA Referring: Noreene Bearded, PA Visit Date: 12/20/2023   Subjective:  New Patient (Initial Visit) (Patient states his worst joint pain is in his knees. Patient states at times her has pain in his feet, hands, left elbow, and left shoulder. )   Discussed the use of AI scribe software for clinical note transcription with the patient, who gave verbal consent to proceed.  History of Present Illness   Keith Vance is a 63 year old male who presents for evaluation of potential inflammatory arthritis. He was referred by his pulmonologist, Frances Ingles, for evaluation of lung nodules without clear etiology and joint problems.  He has lung nodules identified on imaging, accompanied by significant nocturnal dyspnea with oxygen  saturation levels dropping to 78-80%. He uses inhalers to manage these symptoms. A persistent cough lasting eight months has improved with over-the-counter cough syrup and medication. 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  He has a history of joint problems, particularly in his knees. A left knee replacement required revision surgery due to poor outcomes, yet he experiences worse pain than before. 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 He is hesitant to undergo further surgery unless necessary. Significant pain in the right knee affects his ability to dress and perform daily activities. He also reports pain in the left shoulder, elbow, hands, and feet, but not in the right shoulder or elbow. Pain management includes pain sap used two to three times daily, meloxicam , and ibuprofen. He has had fluid drained from his knees in the past, with the last procedure occurring about a year  ago.  His joint issues are exacerbated by a history of handling horses, which involved numerous injuries, including broken ribs. He can no longer ride horses due to pain. Significant swelling occurs in his legs, particularly the right leg, which swells more than the left. No history of blood clots or varicose veins.  No recent injections or surgeries for his joint issues and he has not been on medications like duloxetine, gabapentin , or Lyrica.       Activities of Daily Living:  Patient reports morning stiffness for 24 hours.   Patient Reports nocturnal pain.  Difficulty dressing/grooming: Reports Difficulty climbing stairs: Reports Difficulty getting out of chair: Reports Difficulty using hands for taps, buttons, cutlery, and/or writing: Reports  Review of Systems  Constitutional:  Negative for fatigue.  HENT:  Negative for mouth sores and mouth dryness.   Eyes:  Negative for dryness.  Respiratory:  Positive for shortness of breath.   Cardiovascular:  Negative for chest pain and palpitations.  Gastrointestinal:  Negative for blood in stool, constipation and diarrhea.  Endocrine: Negative for increased urination.  Genitourinary:  Negative for involuntary urination.  Musculoskeletal:  Positive for joint pain, gait problem, joint pain, joint swelling, morning stiffness and muscle tenderness. Negative for myalgias, muscle weakness and myalgias.  Skin:  Negative for color change, rash, hair loss and sensitivity to sunlight.  Allergic/Immunologic: Negative for susceptible to infections.  Neurological:  Negative for dizziness and headaches.  Hematological:  Negative for swollen glands.  Psychiatric/Behavioral:  Positive for sleep disturbance. Negative for depressed mood. The patient is not nervous/anxious.     PMFS History:  Patient Active Problem List   Diagnosis Date Noted   Lymphadenopathy, mediastinal 11/27/2023   Asthma 11/27/2023   Pulmonary nodule 04/05/2023   Hemoptysis  04/05/2023   Other insomnia 02/12/2023   Vasomotor rhinitis 02/26/2022   Class 3 severe obesity with serious comorbidity and body mass index (BMI) of 40.0 to 44.9 in adult (HCC) 03/17/2021   Elevated coronary artery calcium  score 12/03/2020   Pre-diabetes 02/24/2019   Vitamin D  deficiency 02/24/2019   Dyslipidemia, goal LDL below 70 03/18/2018   Morbid obesity (HCC) 02/27/2018   NAFLD (nonalcoholic fatty liver disease) 16/05/9603   Primary osteoarthritis of left knee 07/30/2017   Thoracic aortic aneurysm (HCC) 01/07/2016   Essential hypertension 11/14/2015   Sleep apnea 03/07/2012    Past Medical History:  Diagnosis Date   Arthritis    Ascending aortic aneurysm (HCC)    a. 10/2015 CTA chest: 4.4cm aneurysmal dil of Asc Ao-->rec f/u in 1 yr.   Back pain    Chest pain 10/11/2018   Fatty liver    GERD (gastroesophageal reflux disease)    hx   Heart disease    History of kidney stones    Hyperlipidemia    Hypertension    Joint pain    Liver disease    Lower extremity edema    Obesity    Pneumonia    hx   Prediabetes    Rheumatoid arthritis (HCC)    SBO (small bowel obstruction) (HCC)    a. 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   Sleep apnea    cpap    Family History  Problem Relation Age of Onset   Cancer Mother    Hypertension Mother    Hyperlipidemia Mother    Stroke Mother    Heart disease Mother    Depression Mother    Hypertension Father    Diabetes Father    Cancer Father        bone   Hyperlipidemia Father    Heart attack Maternal Grandmother        >30s of age   Past Surgical History:  Procedure Laterality Date   HERNIA REPAIR Left    inguinal, and umbilical   JOINT REPLACEMENT     REVISION TOTAL KNEE ARTHROPLASTY Left 07/30/2017   LEFT TOTAL KNEE PARTIAL REVISION AND SYNOVECTOMY/notes 07/30/2017   TOTAL KNEE ARTHROPLASTY Left 04/24/2016   Procedure: LEFT TOTAL KNEE ARTHROPLASTY;  Surgeon: Claiborne Crew, MD;  Location: [facility];  Service: Orthopedics;  Laterality: Left;    TOTAL KNEE REVISION Left 07/30/2017   Procedure: LEFT TOTAL KNEE PARTIAL REVISION AND SYNOVECTOMY;  Surgeon: Wendolyn Hamburger, MD;  Location: [facility];  Service: Orthopedics;  Laterality: Left;   Social History   Social History Narrative   Not on file   Immunization History  Administered Date(s) Administered   Hepatitis B, ADULT 09/09/2013, 10/10/2013, 03/10/2014   Influenza Inj Mdck Quad Pf 06/09/2016, 05/17/2018   Influenza,inj,Quad PF,6+ Mos 06/08/2017, 05/29/2019, 05/10/2022   Influenza-Unspecified 05/25/2021   PFIZER(Purple Top)SARS-COV-2 Vaccination 09/18/2019, 10/09/2019, 07/08/2020   Pneumococcal Polysaccharide-23 02/28/2015   Tdap 12/14/2021     Objective: Vital Signs: BP (!) 152/100 (BP Location: Right Arm, Patient Position: Sitting, Cuff Size: Large)   Pulse (!) 59   Resp 16   Ht 5\' 10"  (1.778 m)   Wt (!) 319 lb (144.7 kg)   BMI 45.77 kg/m    Physical Exam Constitutional:  Appearance: He is obese.  Eyes:     Conjunctiva/sclera: Conjunctivae normal.  Cardiovascular:     Rate and Rhythm: Normal rate and regular rhythm.  Pulmonary:     Effort: Pulmonary effort is normal.     Breath sounds: Normal breath sounds.  Lymphadenopathy:     Cervical: No cervical adenopathy.  Skin:    General: Skin is warm and dry.     Findings: No rash.  Neurological:     Mental Status: He is alert.  Psychiatric:        Mood and Affect: Mood normal.      Musculoskeletal Exam:  Shoulders full ROM no tenderness or swelling Elbows full ROM, left elbow tenderness to pressure without palpable swelling Wrists full ROM no tenderness or swelling Fingers full ROM no tenderness or swelling There is right worse than left knee swelling, with tenderness to pressure in the joint around all sides, range of motion is near normal though guarding with pain at limits of flexion and extension Ankles full ROM no tenderness or swelling MTPs full ROM no tenderness or  swelling    Investigation: No additional findings.  Imaging: No results found.  Recent Labs: Lab Results  Component Value Date   WBC 8.1 08/30/2023   HGB 15.2 08/30/2023   PLT 180 08/30/2023   NA 141 08/30/2023   K 4.3 08/30/2023   CL 101 08/30/2023   CO2 23 08/30/2023   GLUCOSE 101 (H) 08/30/2023   BUN 21 08/30/2023   CREATININE 1.15 08/30/2023   BILITOT 0.9 08/30/2023   ALKPHOS 68 08/30/2023   AST 74 (H) 08/30/2023   ALT 83 (H) 08/30/2023   PROT 7.5 08/30/2023   ALBUMIN 4.2 08/30/2023   CALCIUM  8.9 08/30/2023   GFRAA 92 01/02/2020    Speciality Comments: No specialty comments available.  Procedures:  No procedures performed Allergies: Patient has no known allergies.   Assessment / Plan:     Visit Diagnoses: Knee swelling - Plan: Sedimentation rate, C-reactive protein, Rheumatoid factor, Cyclic citrul peptide antibody, IgG, C3 and C4 Knee pain and swelling with definite effusion but could be due to severe underlying osteoarthritis.  Due to poor outcome so far after left knee replacement he is not interested in pursuing the right knee at this time.Chronic osteoarthritis with significant pain and functional limitations, especially in knees. Left knee replacement with complications and ongoing pain. Right knee with swelling and pain. Left shoulder and elbow pain likely due to osteoarthritis. Managed with NSAIDs.  - Checking rheumatoid factor, CCP, sed rate, and CRP for any evidence of inflammatory arthritis - If lab test all negative consider addition of duloxetine or Lyrica for additional OA pain management - Also would consider follow-up examination of the right knee to include ultrasound and if possible aspiration and fluid analysis  Lymphadenopathy, mediastinal - Plan: Angiotensin converting enzyme Mediastinal lymphadenopathy and nodules with metabolic uptake looks like a Sevilis checked screening for sarcoidosis by pulmonology clinic not yet obtained.  No other  specific characteristic clinical features.  Effusion of left elbow - Plan: Sedimentation rate, C-reactive protein, Rheumatoid factor, Cyclic citrul peptide antibody, IgG, C3 and C4 - Inflammatory workup as well for the elbow as discussed for the above     Orders: Orders Placed This Encounter  Procedures   Sedimentation rate   C-reactive protein   Rheumatoid factor   Cyclic citrul peptide antibody, IgG   C3 and C4   Angiotensin converting enzyme   No orders of the defined types were  placed in this encounter.   Follow-Up Instructions: Return in about 3 months (around 03/20/2024) for New pt ?arthritis f/u 3mos.   Matt Song, MD  Note - This record has been created using [employer].  Chart creation errors have been sought, but may not always  have been located. Such creation errors do not reflect on  the standard of medical care.

## 2023-12-20 ENCOUNTER — Encounter: Payer: Self-pay | Admitting: Internal Medicine

## 2023-12-20 ENCOUNTER — Ambulatory Visit: Payer: No Typology Code available for payment source | Attending: Internal Medicine | Admitting: Internal Medicine

## 2023-12-20 VITALS — BP 152/100 | HR 59 | Resp 16 | Ht 70.0 in | Wt 319.0 lb

## 2023-12-20 DIAGNOSIS — M1712 Unilateral primary osteoarthritis, left knee: Secondary | ICD-10-CM | POA: Diagnosis not present

## 2023-12-20 DIAGNOSIS — M25422 Effusion, left elbow: Secondary | ICD-10-CM

## 2023-12-20 DIAGNOSIS — R590 Localized enlarged lymph nodes: Secondary | ICD-10-CM

## 2023-12-20 DIAGNOSIS — M25469 Effusion, unspecified knee: Secondary | ICD-10-CM

## 2023-12-20 NOTE — Telephone Encounter (Signed)
 Order has been signed and successfully faxed 12/20/23.

## 2023-12-20 NOTE — Patient Instructions (Addendum)
 I am checking several blood tests for evidence of rheumatoid arthritis or other widespread inflammation. I am also checking a test for sarcoidosis, which would be a condition that causes inflammatory nodules in the chest and lungs.  If results are positive for these antibodies we may treat this with medications specific to those conditions such as hydroxychloroquine or follow up to talk about stronger medicines if needed.  If results are negative or nonspecific we could try starting medicine such as duloxetine for osteoarthritis pain. We could also follow up again and examine knees with possible fluid drainage to analyze.

## 2023-12-22 LAB — C-REACTIVE PROTEIN: CRP: <3 mg/L (ref <8.0)

## 2023-12-22 LAB — CYCLIC CITRUL PEPTIDE ANTIBODY, IGG: Cyclic Citrullin Peptide Ab: <16 U

## 2023-12-22 LAB — C3 AND C4
C3 Complement: 160 mg/dL (ref 82–185)
C4 Complement: 29 mg/dL (ref 15–53)

## 2023-12-22 LAB — RHEUMATOID FACTOR: Rheumatoid fact SerPl-aCnc: <10 [IU]/mL (ref <14)

## 2023-12-22 LAB — SEDIMENTATION RATE: Sed Rate: 38 mm/h — ABNORMAL HIGH (ref 0–20)

## 2023-12-22 LAB — ANGIOTENSIN CONVERTING ENZYME: Angiotensin-Converting Enzyme: 42 U/L (ref 9–67)

## 2023-12-25 ENCOUNTER — Telehealth: Payer: Self-pay | Admitting: Adult Health

## 2023-12-25 DIAGNOSIS — G4733 Obstructive sleep apnea (adult) (pediatric): Secondary | ICD-10-CM

## 2023-12-25 DIAGNOSIS — G4734 Idiopathic sleep related nonobstructive alveolar hypoventilation: Secondary | ICD-10-CM

## 2023-12-25 NOTE — Telephone Encounter (Signed)
 Overnight oximetry test on CPAP shows persistent nocturnal desaturations despite good control on CPAP.  Recommend in-lab CPAP titration study. Order in please let patient know.

## 2023-12-26 ENCOUNTER — Encounter: Payer: Self-pay | Admitting: *Deleted

## 2023-12-26 NOTE — Progress Notes (Signed)
 Called and spoke with patient, advised of results per Roena Clark NP.  He did not have a 3 month f/u scheduled.  I scheduled him to see Dr. Gaynell Keeler on 03/10/24 at 2:30 pm, advised to arrive by 2:15 pm for check in.  Nothing further needed.

## 2023-12-26 NOTE — Telephone Encounter (Signed)
 Patient is returning call. Did read what I seen from the nurse patient said ok and he will check mychart as well . Hope that was ok

## 2023-12-26 NOTE — Telephone Encounter (Signed)
 ATC x1.  Left detailed message per DPR regarding results of ONO on CPAP still showed desaturations and needs an in lab CPAP titrations study.  Sent mychart message as well.

## 2023-12-27 NOTE — Telephone Encounter (Signed)
 This has been completed, see phone note from 4/25.  Nothing further needed.

## 2024-01-02 ENCOUNTER — Emergency Department (HOSPITAL_BASED_OUTPATIENT_CLINIC_OR_DEPARTMENT_OTHER): Admitting: Radiology

## 2024-01-02 ENCOUNTER — Other Ambulatory Visit: Payer: Self-pay

## 2024-01-02 ENCOUNTER — Encounter (HOSPITAL_BASED_OUTPATIENT_CLINIC_OR_DEPARTMENT_OTHER): Payer: Self-pay | Admitting: Emergency Medicine

## 2024-01-02 ENCOUNTER — Emergency Department (HOSPITAL_BASED_OUTPATIENT_CLINIC_OR_DEPARTMENT_OTHER)
Admission: EM | Admit: 2024-01-02 | Discharge: 2024-01-02 | Disposition: A | Discharge status: Home / Self Care | Attending: Emergency Medicine | Admitting: Emergency Medicine

## 2024-01-02 ENCOUNTER — Emergency Department (HOSPITAL_BASED_OUTPATIENT_CLINIC_OR_DEPARTMENT_OTHER)

## 2024-01-02 DIAGNOSIS — R7989 Other specified abnormal findings of blood chemistry: Secondary | ICD-10-CM | POA: Diagnosis not present

## 2024-01-02 DIAGNOSIS — Z96652 Presence of left artificial knee joint: Secondary | ICD-10-CM | POA: Insufficient documentation

## 2024-01-02 DIAGNOSIS — M25562 Pain in left knee: Secondary | ICD-10-CM | POA: Diagnosis not present

## 2024-01-02 DIAGNOSIS — M25512 Pain in left shoulder: Secondary | ICD-10-CM | POA: Diagnosis not present

## 2024-01-02 DIAGNOSIS — M2550 Pain in unspecified joint: Secondary | ICD-10-CM

## 2024-01-02 DIAGNOSIS — J40 Bronchitis, not specified as acute or chronic: Secondary | ICD-10-CM | POA: Diagnosis not present

## 2024-01-02 DIAGNOSIS — R21 Rash and other nonspecific skin eruption: Secondary | ICD-10-CM | POA: Diagnosis present

## 2024-01-02 DIAGNOSIS — M1712 Unilateral primary osteoarthritis, left knee: Secondary | ICD-10-CM

## 2024-01-02 DIAGNOSIS — Z79899 Other long term (current) drug therapy: Secondary | ICD-10-CM | POA: Insufficient documentation

## 2024-01-02 DIAGNOSIS — Z7982 Long term (current) use of aspirin: Secondary | ICD-10-CM | POA: Insufficient documentation

## 2024-01-02 LAB — CBC WITH DIFFERENTIAL/PLATELET
Abs Immature Granulocytes: 0.02 10*3/uL (ref 0.00–0.07)
Basophils Absolute: 0 10*3/uL (ref 0.0–0.1)
Basophils Relative: 1 %
Eosinophils Absolute: 0.1 10*3/uL (ref 0.0–0.5)
Eosinophils Relative: 2 %
HCT: 42.7 % (ref 39.0–52.0)
Hemoglobin: 14.9 g/dL (ref 13.0–17.0)
Immature Granulocytes: 0 %
Lymphocytes Relative: 8 %
Lymphs Abs: 0.5 10*3/uL — ABNORMAL LOW (ref 0.7–4.0)
MCH: 32.4 pg (ref 26.0–34.0)
MCHC: 34.9 g/dL (ref 30.0–36.0)
MCV: 92.8 fL (ref 80.0–100.0)
Monocytes Absolute: 0.5 10*3/uL (ref 0.1–1.0)
Monocytes Relative: 7 %
Neutro Abs: 5.4 10*3/uL (ref 1.7–7.7)
Neutrophils Relative %: 82 %
Platelets: 111 10*3/uL — ABNORMAL LOW (ref 150–400)
RBC: 4.6 MIL/uL (ref 4.22–5.81)
RDW: 13.3 % (ref 11.5–15.5)
WBC: 6.6 10*3/uL (ref 4.0–10.5)
nRBC: 0 % (ref 0.0–0.2)

## 2024-01-02 LAB — COMPREHENSIVE METABOLIC PANEL WITH GFR
ALT: 51 U/L — ABNORMAL HIGH (ref 0–44)
AST: 42 U/L — ABNORMAL HIGH (ref 15–41)
Albumin: 4 g/dL (ref 3.5–5.0)
Alkaline Phosphatase: 85 U/L (ref 38–126)
Anion gap: 13 (ref 5–15)
BUN: 16 mg/dL (ref 8–23)
CO2: 26 mmol/L (ref 22–32)
Calcium: 9.3 mg/dL (ref 8.9–10.3)
Chloride: 97 mmol/L — ABNORMAL LOW (ref 98–111)
Creatinine, Ser: 1.23 mg/dL (ref 0.61–1.24)
GFR, Estimated: >60 mL/min (ref >60)
Glucose, Bld: 135 mg/dL — ABNORMAL HIGH (ref 70–99)
Potassium: 3.5 mmol/L (ref 3.5–5.1)
Sodium: 135 mmol/L (ref 135–145)
Total Bilirubin: 1 mg/dL (ref 0.0–1.2)
Total Protein: 7.2 g/dL (ref 6.5–8.1)

## 2024-01-02 LAB — D-DIMER, QUANTITATIVE: D-Dimer, Quant: 1.78 ug{FEU}/mL — ABNORMAL HIGH (ref 0.00–0.50)

## 2024-01-02 LAB — PROTIME-INR
INR: 1.2 (ref 0.8–1.2)
Prothrombin Time: 15.1 s (ref 11.4–15.2)

## 2024-01-02 LAB — PRO BRAIN NATRIURETIC PEPTIDE: Pro Brain Natriuretic Peptide: 495 pg/mL — ABNORMAL HIGH (ref <300.0)

## 2024-01-02 LAB — TROPONIN T, HIGH SENSITIVITY
Troponin T High Sensitivity: 18 ng/L (ref <19)
Troponin T High Sensitivity: 17 ng/L (ref <19)

## 2024-01-02 LAB — CBG MONITORING, ED: Glucose-Capillary: 137 mg/dL — ABNORMAL HIGH (ref 70–99)

## 2024-01-02 MED ORDER — DOXYCYCLINE HYCLATE 100 MG PO TABS
100.0000 mg | ORAL_TABLET | Freq: Two times a day (BID) | ORAL | 0 refills | Status: DC
Start: 2024-01-02 — End: 2024-03-10

## 2024-01-02 MED ORDER — IPRATROPIUM-ALBUTEROL 0.5-2.5 (3) MG/3ML IN SOLN
3.0000 mL | Freq: Once | RESPIRATORY_TRACT | Status: AC
  Administered 2024-01-02: 3 mL via RESPIRATORY_TRACT
  Filled 2024-01-02: qty 3

## 2024-01-02 MED ORDER — PREDNISONE 50 MG PO TABS: 50.0000 mg | ORAL_TABLET | Freq: Every day | ORAL | 0 refills | Status: DC

## 2024-01-02 MED ORDER — IOHEXOL 350 MG/ML SOLN
100.0000 mL | Freq: Once | INTRAVENOUS | Status: AC | PRN
  Administered 2024-01-02: 100 mL via INTRAVENOUS

## 2024-01-02 MED ORDER — MORPHINE SULFATE (PF) 4 MG/ML IV SOLN
4.0000 mg | Freq: Once | INTRAVENOUS | Status: AC
  Administered 2024-01-02: 4 mg via INTRAVENOUS
  Filled 2024-01-02: qty 1

## 2024-01-02 MED ORDER — ONDANSETRON HCL 4 MG/2ML IJ SOLN
4.0000 mg | Freq: Once | INTRAMUSCULAR | Status: AC
  Administered 2024-01-02: 4 mg via INTRAVENOUS
  Filled 2024-01-02: qty 2

## 2024-01-02 MED ORDER — ASPIRIN 81 MG PO CHEW
324.0000 mg | CHEWABLE_TABLET | Freq: Once | ORAL | Status: AC
  Administered 2024-01-02: 324 mg via ORAL
  Filled 2024-01-02: qty 4

## 2024-01-02 MED ORDER — MELOXICAM 7.5 MG PO TABS: ORAL_TABLET | ORAL | 1 refills | Status: DC

## 2024-01-02 NOTE — Discharge Instructions (Addendum)
 Take the medications as prescribed to help with the respiratory symptoms.  The abx will cover respiratory infection and possible tick illness.  Follow-up with your doctor to be rechecked.

## 2024-01-02 NOTE — ED Provider Notes (Signed)
 [place] EMERGENCY DEPARTMENT AT [facility] Provider Note   CSN: [id number] Arrival date & time: 01/02/24  0755     History  Chief Complaint  Patient presents with   Shortness of Breath   Rash    Keith Vance is a 63 y.o. male.   Shortness of Breath Associated symptoms: rash   Rash Associated symptoms: shortness of breath      Patient has a history of obesity, hypertension hyperlipidemia bowel obstruction sleep apnea acid reflux, chest pain, liver disease, heart disease, chronic joint pain.  Patient states he has issues with both his shoulder and his knee.  He has seen a rheumatologist recently for evaluation.  Patient states he has had some chronic issues with his left knee and his shoulder.  He has had a knee replacement.  Patient states he started having trouble with increasing pain in his knee and shoulder recently.  Last night symptoms became more severe.  It hurts to put weight on his knee or move his shoulder.  Patient also states he has been feeling more short of breath last night.  Symptoms became more severe this morning so he came to the ED for evaluation.  Patient denies any recent fevers or chills.  He has not noticed any leg swelling.  He did notice a rash recently on his torso.  He does report tick bites recently.  He has felt feverish and has measured temperatures but highest has been in the mid 99.5  Home Medications Prior to Admission medications   Medication Sig Start Date End Date Taking? Authorizing Provider  doxycycline (VIBRA-TABS) 100 MG tablet Take 1 tablet (100 mg total) by mouth 2 (two) times daily. 01/02/24  Yes Trish Furl, MD  predniSONE  (DELTASONE ) 50 MG tablet Take 1 tablet (50 mg total) by mouth daily. 01/02/24  Yes Trish Furl, MD  albuterol  (VENTOLIN  HFA) 108 (90 Base) MCG/ACT inhaler Inhale 1-2 puffs into the lungs every 6 (six) hours as needed. 07/13/23   Parrett, Macdonald Savoy, NP  amLODipine  (NORVASC ) 10 MG tablet TAKE 1 TABLET(10 MG) BY MOUTH  DAILY 09/06/23   Maryclare Smoke A, PA  aspirin  EC 81 MG EC tablet Take 1 tablet (81 mg total) by mouth daily. 10/13/18   Doroteo Gasmen, MD  atorvastatin  (LIPITOR) 40 MG tablet Take 1 tablet (40 mg total) by mouth daily. 09/06/23   Noreene Bearded, PA  benzonatate  (TESSALON ) 200 MG capsule Take 1 capsule (200 mg total) by mouth 2 (two) times daily as needed for cough. Patient not taking: Reported on 12/20/2023 09/06/23   Maryclare Smoke A, PA  carvedilol  (COREG ) 12.5 MG tablet TAKE 1 TABLET(12.5 MG) BY MOUTH TWICE DAILY WITH A MEAL 09/06/23   Edstrom, Tarri Farm, PA  Docusate Calcium  (STOOL SOFTENER PO) Take by mouth.    [provider]  eszopiclone  (LUNESTA ) 1 MG TABS tablet TAKE 1 TABLET(1 MG) BY MOUTH AT BEDTIME AS NEEDED FOR SLEEP 09/17/23   Maryclare Smoke A, PA  fluticasone  (FLONASE ) 50 MCG/ACT nasal spray Place 2 sprays into both nostrils daily. 02/22/22   Boscia, Heather E, NP  fluticasone  furoate-vilanterol (BREO ELLIPTA ) 100-25 MCG/ACT AEPB Inhale 1 puff into the lungs daily. 11/27/23   Parrett, Macdonald Savoy, NP  losartan -hydrochlorothiazide  (HYZAAR) 100-25 MG tablet Take 1 tablet by mouth daily. 09/06/23   Noreene Bearded, PA  meloxicam  (MOBIC ) 7.5 MG tablet TAKE 1 TABLET(7.5 MG) BY MOUTH DAILY 01/02/24   Trish Furl, MD  Multiple Vitamins-Minerals (MULTIVITAMIN WITH MINERALS) tablet  Take 1 tablet by mouth daily.    [provider]  Omega-3 Fatty Acids (FISH OIL) 1200 MG CAPS Take 1,200 mg by mouth daily.    [provider]  sildenafil  (VIAGRA ) 25 MG tablet TAKE 1 TABLET(25 MG) BY MOUTH DAILY AS NEEDED FOR ERECTILE DYSFUNCTION 09/17/23   Noreene Bearded, PA      Allergies    Patient has no known allergies.    Review of Systems   Review of Systems  Respiratory:  Positive for shortness of breath.   Skin:  Positive for rash.    Physical Exam Updated Vital Signs BP 125/77   Pulse 81   Temp 99.5 F (37.5 C) (Oral)   Resp 16   SpO2 96%  Physical Exam Vitals and  nursing note reviewed.  Constitutional:      General: He is not in acute distress.    Comments: Increased BMI  HENT:     Head: Normocephalic and atraumatic.     Right Ear: External ear normal.     Left Ear: External ear normal.  Eyes:     General: No scleral icterus.       Right eye: No discharge.        Left eye: No discharge.     Conjunctiva/sclera: Conjunctivae normal.  Neck:     Trachea: No tracheal deviation.  Cardiovascular:     Rate and Rhythm: Normal rate and regular rhythm.  Pulmonary:     Effort: Pulmonary effort is normal. No respiratory distress.     Breath sounds: Normal breath sounds. No stridor. No wheezing or rales.  Abdominal:     General: Bowel sounds are normal. There is no distension.     Palpations: Abdomen is soft.     Tenderness: There is no abdominal tenderness. There is no guarding or rebound.  Musculoskeletal:        General: No tenderness or deformity.     Cervical back: Neck supple.     Right lower leg: No edema.     Left lower leg: No edema.     Comments: No effusion noted to left knee left shoulder, no erythema, tenderness left knee and left shoulder, decreased range of motion due to pain and discomfort  Skin:    General: Skin is warm and dry.     Findings: No rash.  Neurological:     General: No focal deficit present.     Mental Status: He is alert.     Cranial Nerves: No cranial nerve deficit, dysarthria or facial asymmetry.     Sensory: No sensory deficit.     Motor: No abnormal muscle tone or seizure activity.     Coordination: Coordination normal.  Psychiatric:        Mood and Affect: Mood normal.     ED Results / Procedures / Treatments   Labs (all labs ordered are listed, but only abnormal results are displayed) Labs Reviewed  CBC WITH DIFFERENTIAL/PLATELET - Abnormal; Notable for the following components:      Result Value   Platelets 111 (*)    Lymphs Abs 0.5 (*)    All other components within normal limits  COMPREHENSIVE  METABOLIC PANEL WITH GFR - Abnormal; Notable for the following components:   Chloride 97 (*)    Glucose, Bld 135 (*)    AST 42 (*)    ALT 51 (*)    All other components within normal limits  PRO BRAIN NATRIURETIC PEPTIDE - Abnormal; Notable for the  following components:   Pro Brain Natriuretic Peptide 495.0 (*)    All other components within normal limits  D-DIMER, QUANTITATIVE - Abnormal; Notable for the following components:   D-Dimer, Quant 1.78 (*)    All other components within normal limits  CBG MONITORING, ED - Abnormal; Notable for the following components:   Glucose-Capillary 137 (*)    All other components within normal limits  PROTIME-INR  ROCKY MTN SPOTTED FVR ABS PNL(IGG+IGM)  TROPONIN T, HIGH SENSITIVITY  TROPONIN T, HIGH SENSITIVITY    EKG EKG Interpretation Date/Time:  Wednesday Jan 02 2024 08:04:38 EDT Ventricular Rate:  98 PR Interval:  173 QRS Duration:  114 QT Interval:  351 QTC Calculation: 449 R Axis:   -72  Text Interpretation: Sinus rhythm Left anterior fascicular block Consider right ventricular hypertrophy Nonspecific T abnrm, anterolateral leads , new since last Since last tracing rate faster Confirmed by Trish Furl [phone]) on 01/02/2024 8:08:53 AM  Radiology CT Angio Chest PE W and/or Wo Contrast Result Date: 01/02/2024 CLINICAL DATA:  Pulmonary embolism suspected EXAM: CT ANGIOGRAPHY CHEST WITH CONTRAST TECHNIQUE: Multidetector CT imaging of the chest was performed using the standard protocol during bolus administration of intravenous contrast. Multiplanar CT image reconstructions and MIPs were obtained to evaluate the vascular anatomy. Multiplanar image (3D post-processing) reconstructions and MIPs were obtained to evaluate the vascular anatomy. RADIATION DOSE REDUCTION: This exam was performed according to the departmental dose-optimization program which includes automated exposure control, adjustment of the mA and/or kV according to patient size and/or  use of iterative reconstruction technique. CONTRAST:  OMNIPAQUE  IOHEXOL  350 MG/ML SOLN COMPARISON:  CT angiogram of the chest performed March 23, 2023 FINDINGS: Cardiovascular: Heart size is not significantly changed. No pericardial effusion. Motion related changes limit evaluation of the coronary arteries. 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 There is no evidence of pulmonary embolus to the proximal segmental level. Two vessel aortic arch without dissection. 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 Mediastinum/Nodes: Mediastinal and hilar lymphadenopathy is present. A previously measured right hilar lymph node measures 2.0 cm, previously 2.4 cm on image 68 of series 4. A left mediastinal lymph node measures 1.3 cm in short axis dimension, previously 1.0 cm. Image 49 of series 4. Lungs/Pleura: A noncalcified right upper lobe 5 mm pulmonary nodules present on image 62 of series 4 which is not significantly changed. No pleural effusion or pneumothorax. Upper Abdomen: Mildly prominent lymph nodes are present in the upper abdomen which are similar when compared to the previous exam. The liver is not significantly changed. 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 Musculoskeletal: Nothing significant. Review of the MIP images confirms the above findings. IMPRESSION: 1. No definite CT evidence of central pulmonary embolus (to the proximal segmental level). 2. Mediastinal lymphadenopathy which was noted on previous CT scan performed March 23, 2023. 3. 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 4. A 5 mm right upper lobe pulmonary nodule is present. No follow-up needed if patient is low-risk.This recommendation follows the consensus statement: Guidelines for Management of Incidental Pulmonary Nodules Detected on CT Images: From the Fleischner Society 2017; Radiology 2017; 284:228-243. 5.  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 Recommend annual imaging followup by CTA or MRA. This recommendation follows 2010 ACCF/AHA/AATS/ACR/ASA/SCA/SCAI/SIR/STS/SVM Guidelines for the Diagnosis and Management of Patients with Thoracic Aortic Disease. Circulation. 2010; 121: U045-W098. Aortic aneurysm NOS (ICD10-I71.9)  Electronically Signed   By: Reagan Camera M.D.   On: 01/02/2024 10:07   DG Shoulder Left Result Date: 01/02/2024 CLINICAL DATA:  Shoulder pain EXAM: LEFT SHOULDER - 2+ VIEW COMPARISON:  None Available. FINDINGS: Advanced osteoarthrosis acromioclavicular joint. No other fracture or bony or articular abnormalities IMPRESSION: Osteoarthrosis without fracture Electronically Signed   By: Fredrich Jefferson M.D.   On: 01/02/2024 08:41   DG Knee 2 Views Left Result Date: 01/02/2024 CLINICAL DATA:  Knee pain EXAM: LEFT KNEE - 1-2 VIEW COMPARISON:  None Available. FINDINGS: Total left knee arthroplasty, components of the prosthesis in good position. No fractures. No evidence of loosening IMPRESSION: Total left knee arthroplasty. Electronically Signed   By: Fredrich Jefferson M.D.   On: 01/02/2024 08:40   DG Chest 2 View Result Date: 01/02/2024 CLINICAL DATA:  Shortness of breath EXAM: CHEST - 2 VIEW COMPARISON:  July 26 24 FINDINGS: The heart size and mediastinal contours are within normal limits. Both lungs are clear. The visualized skeletal structures are unremarkable. IMPRESSION: No active cardiopulmonary disease. Electronically Signed   By: Fredrich Jefferson M.D.   On: 01/02/2024 08:40    Procedures Procedures    Medications Ordered in ED Medications  aspirin  chewable tablet 324 mg (324 mg Oral Given 01/02/24 0843)  morphine  (PF) 4 MG/ML injection 4 mg (4 mg Intravenous Given 01/02/24 0849)  ondansetron  (ZOFRAN ) injection 4 mg (4 mg Intravenous Given 01/02/24 0849)  iohexol  (OMNIPAQUE ) 350 MG/ML injection 100 mL (100 mLs Intravenous Contrast Given 01/02/24 0930)  morphine  (PF)  4 MG/ML injection 4 mg (4 mg Intravenous Given 01/02/24 1105)  ipratropium-albuterol  (DUONEB) 0.5-2.5 (3) MG/3ML nebulizer solution 3 mL (3 mLs Nebulization Given 01/02/24 1044)    ED Course/ Medical Decision Making/ A&P Clinical Course as of 01/02/24 1211  Wed Jan 02, 2024  0904 CBC normal.  Troponin normal.  Comprehensive metabolic unremarkable. [JK]  0904 Pro Brain natriuretic peptide(!) BNP elevated at 495.  D-dimer elevated at 1.78 [JK]  0911 Shoulder x-ray shows osteoarthritis [JK]  0911 Knee x-ray shows total knee arthroplasty [JK]  0911 Chest x-ray does not show evidence of pneumonia [JK]  1029 CT angiogram does not show signs of pulmonary embolism.  Patient has persistent mediastinal lymphadenopathy.  Aneurysm is stable in size.  Pulmonary nodule noted. [JK]    Clinical Course User Index [JK] Trish Furl, MD                                 Medical Decision Making Problems Addressed: Arthralgia, unspecified joint: chronic illness or injury with exacerbation, progression, or side effects of treatment Bronchitis: acute illness or injury that poses a threat to life or bodily functions Rash: acute illness or injury that poses a threat to life or bodily functions  Amount and/or Complexity of Data Reviewed Labs: ordered. Decision-making details documented in ED Course. Radiology: ordered and independent interpretation performed.  Risk OTC drugs. Prescription drug management.   Patient presented to the ED for evaluation of respiratory symptoms as well as arthralgias.  Patient also recently noted a rash.  Consider the possibility of pneumonia, CHF, bronchitis, pulmonary embolism, acute coronary syndrome.  Patient did have an elevated D-dimer.  CT angio was performed.  There is no evidence of pulmonary embolism.  BNP is slightly elevated however patient has normal troponins there is no evidence of pulmonary edema on his chest x-ray or CT angiogram.  Low suspicion for CHF  exacerbation.  Patient was given a breathing treatment while he is here.  He has noticed some improvement.  I suspect there could be a component of bronchitis with his respiratory symptoms.  Will try course of antibiotics as well as steroids.  Patient also reports a rash recently.  He has been exposed to ticks.  Will add on recommended spotted fever labs.  Patient does have slight decrease in his platelet count.  Will start him on a course of doxycycline.  X-rays do not show any signs of any acute abnormality in his shoulder or knee.  There is no effusion.  Low suspicion for infectious arthritis.       Final Clinical Impression(s) / ED Diagnoses Final diagnoses:  Bronchitis  Rash  Arthralgia, unspecified joint    Rx / DC Orders ED Discharge Orders          Ordered    doxycycline (VIBRA-TABS) 100 MG tablet  2 times daily        01/02/24 1209    predniSONE  (DELTASONE ) 50 MG tablet  Daily        01/02/24 1209    meloxicam  (MOBIC ) 7.5 MG tablet        01/02/24 1209              Trish Furl, MD 01/02/24 1213

## 2024-01-02 NOTE — ED Triage Notes (Addendum)
 Pt Keith Vance, ambulatory c/o SOB x2 days, more at night. Pt uses inhalers daily and CPAP at night, family states pt had SpO2 of 78% on CPAP last night. Pt also reports he recently had multiple tick bites. Low grade fever yesterday. Pt has rash to torso, which he states he did not notice until arriving to ED. Lung sounds clear and equal.

## 2024-01-03 ENCOUNTER — Telehealth: Payer: Self-pay | Admitting: Adult Health

## 2024-01-03 NOTE — Telephone Encounter (Signed)
 GM,  Just wanted to double check about Pt. CT w/ contrast, is it not needed anymore? Thank you.

## 2024-01-03 NOTE — Telephone Encounter (Signed)
 No looks like he just had one in the ER . Make sure he keeps follow up from ER .

## 2024-01-03 NOTE — Telephone Encounter (Signed)
 Ok, thank you again. Talk w/ him to update on that and his f/u appt. as well.

## 2024-01-04 ENCOUNTER — Ambulatory Visit (HOSPITAL_BASED_OUTPATIENT_CLINIC_OR_DEPARTMENT_OTHER)

## 2024-01-11 ENCOUNTER — Ambulatory Visit (HOSPITAL_BASED_OUTPATIENT_CLINIC_OR_DEPARTMENT_OTHER)

## 2024-01-29 ENCOUNTER — Other Ambulatory Visit: Payer: Self-pay | Admitting: Family Medicine

## 2024-01-29 DIAGNOSIS — I10 Essential (primary) hypertension: Secondary | ICD-10-CM

## 2024-01-30 ENCOUNTER — Telehealth: Payer: Self-pay | Admitting: *Deleted

## 2024-01-30 NOTE — Telephone Encounter (Signed)
 Copied from CRM [id number]. Topic: General - Call Back - No Documentation >> Jan 30, 2024  9:50 AM [employer] wrote: Reason for CRM: Patient missed call from clinic and would like a callback   Callback #: [phone]

## 2024-01-30 NOTE — Telephone Encounter (Signed)
 Returned call to pt and he was notified of below.

## 2024-01-30 NOTE — Telephone Encounter (Signed)
 LVM to inform pt that we had received a refill request for his carvedilol  from [facility], it looks like he had some meds still and when I call they stated that it was transferred to [place], I called [facility] and stated that he did still have a refill and they were going to fill it and then contact pt to let him know when it was ready.  I have refused the refill to [facility].

## 2024-02-05 NOTE — Telephone Encounter (Signed)
 NFN

## 2024-02-27 ENCOUNTER — Other Ambulatory Visit: Payer: Self-pay | Admitting: Adult Health

## 2024-03-05 ENCOUNTER — Ambulatory Visit (HOSPITAL_BASED_OUTPATIENT_CLINIC_OR_DEPARTMENT_OTHER): Attending: Adult Health | Admitting: Pulmonary Disease

## 2024-03-05 DIAGNOSIS — G4733 Obstructive sleep apnea (adult) (pediatric): Secondary | ICD-10-CM | POA: Diagnosis not present

## 2024-03-05 DIAGNOSIS — G4734 Idiopathic sleep related nonobstructive alveolar hypoventilation: Secondary | ICD-10-CM | POA: Insufficient documentation

## 2024-03-07 ENCOUNTER — Other Ambulatory Visit: Payer: Self-pay

## 2024-03-07 DIAGNOSIS — I10 Essential (primary) hypertension: Secondary | ICD-10-CM

## 2024-03-07 DIAGNOSIS — E66813 Obesity, class 3: Secondary | ICD-10-CM

## 2024-03-07 DIAGNOSIS — E785 Hyperlipidemia, unspecified: Secondary | ICD-10-CM

## 2024-03-10 ENCOUNTER — Ambulatory Visit (INDEPENDENT_AMBULATORY_CARE_PROVIDER_SITE_OTHER): Admitting: Pulmonary Disease

## 2024-03-10 ENCOUNTER — Encounter: Payer: Self-pay | Admitting: Pulmonary Disease

## 2024-03-10 VITALS — BP 121/81 | HR 63 | Ht 69.0 in | Wt 323.2 lb

## 2024-03-10 DIAGNOSIS — R590 Localized enlarged lymph nodes: Secondary | ICD-10-CM

## 2024-03-10 DIAGNOSIS — G4734 Idiopathic sleep related nonobstructive alveolar hypoventilation: Secondary | ICD-10-CM

## 2024-03-10 DIAGNOSIS — G4733 Obstructive sleep apnea (adult) (pediatric): Secondary | ICD-10-CM

## 2024-03-10 DIAGNOSIS — J4520 Mild intermittent asthma, uncomplicated: Secondary | ICD-10-CM

## 2024-03-10 NOTE — Patient Instructions (Signed)
 Your CPAP is working very well  Continue using your inhalers  [place] activities as tolerated  Call us  with significant concerns  I will see you a year from now

## 2024-03-10 NOTE — Progress Notes (Signed)
 Keith Vance    [id number]    1960-10-29  Primary Care Physician:Edstrom, Joesph DELENA, PA  Referring Physician: [name], PA [address] [place],  [place] [address]  Chief complaint:   Patient being seen for obstructive sleep apnea Shortness of breath with activity  HPI:  Patient feels relatively well at present Never smoker  Has used CPAP for over 30 years  Abnormal CT scan of the chest in October 2020 Recent PET scan October 2024, grossly unchanged compared to 2022  Breathing feels about the same Still does have occasional wheezing Is using Breo on a regular basis daily Rarely needs albuterol   He has severe obstructive sleep apnea Has been compliant with CPAP with no significant problems Usually goes to bed between 730 and 8 Falls asleep easily Final wake up to about 2:30 AM  He is active through the day  Denies any chest pains or chest discomfort  No significant changes in his medication recently  He works in a [employer], drilling  Outpatient Encounter Medications as of 03/10/2024  Medication Sig   albuterol  (VENTOLIN  HFA) 108 (90 Base) MCG/ACT inhaler INHALE 1 TO 2 PUFFS INTO THE LUNGS EVERY 6 HOURS AS NEEDED   amLODipine  (NORVASC ) 10 MG tablet TAKE 1 TABLET(10 MG) BY MOUTH DAILY   aspirin  EC 81 MG EC tablet Take 1 tablet (81 mg total) by mouth daily.   atorvastatin  (LIPITOR) 40 MG tablet Take 1 tablet (40 mg total) by mouth daily.   carvedilol  (COREG ) 12.5 MG tablet TAKE 1 TABLET(12.5 MG) BY MOUTH TWICE DAILY WITH A MEAL   Docusate Calcium  (STOOL SOFTENER PO) Take by mouth.   eszopiclone  (LUNESTA ) 1 MG TABS tablet TAKE 1 TABLET(1 MG) BY MOUTH AT BEDTIME AS NEEDED FOR SLEEP   fluticasone  furoate-vilanterol (BREO ELLIPTA ) 100-25 MCG/ACT AEPB Inhale 1 puff into the lungs daily.   losartan -hydrochlorothiazide  (HYZAAR) 100-25 MG tablet Take 1 tablet by mouth daily.   meloxicam  (MOBIC ) 7.5 MG tablet TAKE 1 TABLET(7.5 MG) BY MOUTH DAILY    Multiple Vitamins-Minerals (MULTIVITAMIN WITH MINERALS) tablet Take 1 tablet by mouth daily.   Omega-3 Fatty Acids (FISH OIL) 1200 MG CAPS Take 1,200 mg by mouth daily.   sildenafil  (VIAGRA ) 25 MG tablet TAKE 1 TABLET(25 MG) BY MOUTH DAILY AS NEEDED FOR ERECTILE DYSFUNCTION   benzonatate  (TESSALON ) 200 MG capsule Take 1 capsule (200 mg total) by mouth 2 (two) times daily as needed for cough. (Patient not taking: Reported on 03/10/2024)   [DISCONTINUED] doxycycline  (VIBRA -TABS) 100 MG tablet Take 1 tablet (100 mg total) by mouth 2 (two) times daily. (Patient not taking: Reported on 03/10/2024)   [DISCONTINUED] fluticasone  (FLONASE ) 50 MCG/ACT nasal spray Place 2 sprays into both nostrils daily. (Patient not taking: Reported on 03/10/2024)   [DISCONTINUED] predniSONE  (DELTASONE ) 50 MG tablet Take 1 tablet (50 mg total) by mouth daily. (Patient not taking: Reported on 03/10/2024)   No facility-administered encounter medications on file as of 03/10/2024.    Allergies as of 03/10/2024   (No Known Allergies)    Past Medical History:  Diagnosis Date   Arthritis    Ascending aortic aneurysm (HCC)    a. 10/2015 CTA chest: 4.4cm aneurysmal dil of Asc Ao-->rec f/u in 1 yr.   Back pain    Chest pain 10/11/2018   Fatty liver    GERD (gastroesophageal reflux disease)    hx   Heart disease    History of kidney stones  Hyperlipidemia    Hypertension    Joint pain    Liver disease    Lower extremity edema    Obesity    Pneumonia    hx   Prediabetes    Rheumatoid arthritis (HCC)    SBO (small bowel obstruction) (HCC)    a. 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   Sleep apnea    cpap    Past Surgical History:  Procedure Laterality Date   HERNIA REPAIR Left    inguinal, and umbilical   JOINT REPLACEMENT     REVISION TOTAL KNEE ARTHROPLASTY Left 07/30/2017   LEFT TOTAL KNEE PARTIAL REVISION AND SYNOVECTOMY/notes 07/30/2017   TOTAL KNEE ARTHROPLASTY Left 04/24/2016   Procedure: LEFT TOTAL KNEE ARTHROPLASTY;   Surgeon: Donnice Car, MD;  Location: [facility];  Service: Orthopedics;  Laterality: Left;   TOTAL KNEE REVISION Left 07/30/2017   Procedure: LEFT TOTAL KNEE PARTIAL REVISION AND SYNOVECTOMY;  Surgeon: Liam Lerner, MD;  Location: [facility] OR;  Service: Orthopedics;  Laterality: Left;    Family History  Problem Relation Age of Onset   Cancer Mother    Hypertension Mother    Hyperlipidemia Mother    Stroke Mother    Heart disease Mother    Depression Mother    Hypertension Father    Diabetes Father    Cancer Father        bone   Hyperlipidemia Father    Heart attack Maternal Grandmother        >54s of age    Social History   Socioeconomic History   Marital status: Married    Spouse name: Not on file   Number of children: Not on file   Years of education: Not on file   Highest education level: Not on file  Occupational History   Occupation: welder Therapist, art  Tobacco Use   Smoking status: Never    Passive exposure: Never   Smokeless tobacco: Never   Tobacco comments:    off and on as a teenager - very little per patient   Vaping Use   Vaping status: Never Used  Substance and Sexual Activity   Alcohol use: No   Drug use: No   Sexual activity: Yes  Other Topics Concern   Not on file  Social History Narrative   Not on file   Social Drivers of Health   Financial Resource Strain: Not on file  Food Insecurity: Not on file  Transportation Needs: Not on file  Physical Activity: Not on file  Stress: Not on file  Social Connections: Not on file  Intimate Partner Violence: Not on file    Review of Systems  Respiratory:  Positive for apnea and shortness of breath.   Psychiatric/Behavioral:  Positive for sleep disturbance.     Vitals:   03/10/24 1423  BP: 121/81  Pulse: 63  SpO2: 95%     Physical Exam Constitutional:      Appearance: He is obese.  HENT:     Head: Normocephalic.     Mouth/Throat:     Mouth: Mucous membranes are moist.  Eyes:     General: No  scleral icterus. Cardiovascular:     Rate and Rhythm: Normal rate and regular rhythm.     Heart sounds: No murmur heard.    No friction rub.  Pulmonary:     Effort: No respiratory distress.     Breath sounds: No stridor. No wheezing or rhonchi.  Musculoskeletal:     Cervical back: No rigidity or tenderness.  Neurological:     Mental Status: He is alert.  Psychiatric:        Mood and Affect: Mood normal.    Data Reviewed: CPAP compliance shows excellent compliance AutoSet 8-20, 95 percentile pressure of 15.8,, maximum pressure of 17.5 AHI of 2.2  CT scan 01/02/2024 with midsternal adenopathy, stable from previous  PET scan 06/11/2023 with adenopathy, grossly unchanged compared to 1 performed in 2022  Assessment:  Severe obstructive sleep apnea - Compliant with CPAP - Continues to benefit from CPAP use  Shortness of breath with exertion - Has been on Breo  PET scan with mediastinal adenopathy which is stable from prior  Plan/Recommendations: Encouraged to continue CPAP use on a nightly basis  Graded activity as tolerated  Will follow-up on a yearly basis  Encouraged to give us  a call with any significant concerns   Jennet Epley MD [facility] 03/10/2024, 2:30 PM  CC: Wallace Joesph LABOR, PA

## 2024-03-14 ENCOUNTER — Emergency Department (HOSPITAL_BASED_OUTPATIENT_CLINIC_OR_DEPARTMENT_OTHER)

## 2024-03-14 ENCOUNTER — Encounter (HOSPITAL_BASED_OUTPATIENT_CLINIC_OR_DEPARTMENT_OTHER): Payer: Self-pay

## 2024-03-14 ENCOUNTER — Other Ambulatory Visit: Payer: Self-pay

## 2024-03-14 ENCOUNTER — Emergency Department (HOSPITAL_BASED_OUTPATIENT_CLINIC_OR_DEPARTMENT_OTHER)
Admission: EM | Admit: 2024-03-14 | Discharge: 2024-03-14 | Disposition: A | Discharge status: Home / Self Care | Attending: Emergency Medicine | Admitting: Emergency Medicine

## 2024-03-14 DIAGNOSIS — Z79899 Other long term (current) drug therapy: Secondary | ICD-10-CM | POA: Insufficient documentation

## 2024-03-14 DIAGNOSIS — M7989 Other specified soft tissue disorders: Secondary | ICD-10-CM | POA: Diagnosis present

## 2024-03-14 DIAGNOSIS — Z7982 Long term (current) use of aspirin: Secondary | ICD-10-CM | POA: Diagnosis not present

## 2024-03-14 DIAGNOSIS — I10 Essential (primary) hypertension: Secondary | ICD-10-CM | POA: Diagnosis not present

## 2024-03-14 DIAGNOSIS — R944 Abnormal results of kidney function studies: Secondary | ICD-10-CM | POA: Insufficient documentation

## 2024-03-14 LAB — COMPREHENSIVE METABOLIC PANEL WITH GFR
ALT: 52 U/L — ABNORMAL HIGH (ref 0–44)
AST: 38 U/L (ref 15–41)
Albumin: 4.3 g/dL (ref 3.5–5.0)
Alkaline Phosphatase: 105 U/L (ref 38–126)
Anion gap: 15 (ref 5–15)
BUN: 27 mg/dL — ABNORMAL HIGH (ref 8–23)
CO2: 23 mmol/L (ref 22–32)
Calcium: 8.9 mg/dL (ref 8.9–10.3)
Chloride: 102 mmol/L (ref 98–111)
Creatinine, Ser: 1.44 mg/dL — ABNORMAL HIGH (ref 0.61–1.24)
GFR, Estimated: 55 mL/min — ABNORMAL LOW (ref >60)
Glucose, Bld: 136 mg/dL — ABNORMAL HIGH (ref 70–99)
Potassium: 3.1 mmol/L — ABNORMAL LOW (ref 3.5–5.1)
Sodium: 141 mmol/L (ref 135–145)
Total Bilirubin: 1 mg/dL (ref 0.0–1.2)
Total Protein: 7.6 g/dL (ref 6.5–8.1)

## 2024-03-14 LAB — CBC WITH DIFFERENTIAL/PLATELET
Abs Immature Granulocytes: 0.03 K/uL (ref 0.00–0.07)
Basophils Absolute: 0.1 K/uL (ref 0.0–0.1)
Basophils Relative: 1 %
Eosinophils Absolute: 0.1 K/uL (ref 0.0–0.5)
Eosinophils Relative: 1 %
HCT: 40.1 % (ref 39.0–52.0)
Hemoglobin: 14 g/dL (ref 13.0–17.0)
Immature Granulocytes: 0 %
Lymphocytes Relative: 16 %
Lymphs Abs: 1.5 K/uL (ref 0.7–4.0)
MCH: 32.9 pg (ref 26.0–34.0)
MCHC: 34.9 g/dL (ref 30.0–36.0)
MCV: 94.1 fL (ref 80.0–100.0)
Monocytes Absolute: 0.8 K/uL (ref 0.1–1.0)
Monocytes Relative: 9 %
Neutro Abs: 6.6 K/uL (ref 1.7–7.7)
Neutrophils Relative %: 73 %
Platelets: 142 K/uL — ABNORMAL LOW (ref 150–400)
RBC: 4.26 MIL/uL (ref 4.22–5.81)
RDW: 13.6 % (ref 11.5–15.5)
WBC: 9.1 K/uL (ref 4.0–10.5)
nRBC: 0 % (ref 0.0–0.2)

## 2024-03-14 LAB — LACTIC ACID, PLASMA
Lactic Acid, Venous: 1 mmol/L (ref 0.5–1.9)
Lactic Acid, Venous: 0.7 mmol/L (ref 0.5–1.9)

## 2024-03-14 LAB — PRO BRAIN NATRIURETIC PEPTIDE: Pro Brain Natriuretic Peptide: 375 pg/mL — ABNORMAL HIGH (ref <300.0)

## 2024-03-14 MED ORDER — POTASSIUM CHLORIDE CRYS ER 20 MEQ PO TBCR
20.0000 meq | EXTENDED_RELEASE_TABLET | Freq: Once | ORAL | Status: AC
Start: 2024-03-14 — End: 2024-03-14
  Administered 2024-03-14: 20 meq via ORAL
  Filled 2024-03-14: qty 1

## 2024-03-14 MED ORDER — CEPHALEXIN 500 MG PO CAPS: 500.0000 mg | ORAL_CAPSULE | Freq: Two times a day (BID) | ORAL | 0 refills | Status: DC

## 2024-03-14 MED ORDER — CEPHALEXIN 500 MG PO CAPS: 500.0000 mg | ORAL_CAPSULE | Freq: Two times a day (BID) | ORAL | 0 refills | Status: AC

## 2024-03-14 NOTE — ED Triage Notes (Signed)
 Pt c/o swollen, red R calf area. Advises that it's been going on for a little while, worse this morning. Advises swelling is better since taking sock off, but the color is worse. Advise that area is warm, tender to touch, chills x2 nights.

## 2024-03-14 NOTE — Discharge Instructions (Addendum)
 I have prescribed medications to help treat your likely infection of the right lower leg, please take 1 tablet twice a day for the next 7 days.  Please follow-up with your primary care physician for further workup of your right lower leg redness.

## 2024-03-14 NOTE — ED Provider Notes (Signed)
 [place] EMERGENCY DEPARTMENT AT [facility] Provider Note   CSN: [id number] Arrival date & time: 03/14/24  1141     Patient presents with: Leg Swelling   Keith Vance is a 63 y.o. male.   63 year old male with a past medical history of high blood pressure presents to the ED with a chief complaint of redness, swelling to the right lower leg which began yesterday evening.  According to significant other at the bedside, he noticed his leg to be more swollen than normal, this subsequently continued to get red.  He reports his legs usually do not get swollen or red unless he has worked a full 12 hours at work.  He does report this feels out of the ordinary, this is not painful.  He is currently on no fluid pills.  Over the last 2 nights, patient has had chills at home, however they have not recorded a fever.  He reports his symptoms are exacerbated with walking and moving around.  No nausea, no vomiting, no prior history of blood clots, no trauma.  The history is provided by the patient.       Prior to Admission medications   Medication Sig Start Date End Date Taking? Authorizing Provider  albuterol  (VENTOLIN  HFA) 108 (90 Base) MCG/ACT inhaler INHALE 1 TO 2 PUFFS INTO THE LUNGS EVERY 6 HOURS AS NEEDED 02/27/24   Parrett, Madelin RAMAN, NP  amLODipine  (NORVASC ) 10 MG tablet TAKE 1 TABLET(10 MG) BY MOUTH DAILY 09/06/23   [name], PA  aspirin  EC 81 MG EC tablet Take 1 tablet (81 mg total) by mouth daily. 10/13/18   Rosario Leatrice FERNS, MD  atorvastatin  (LIPITOR) 40 MG tablet Take 1 tablet (40 mg total) by mouth daily. 09/06/23   [name], PA  benzonatate  (TESSALON ) 200 MG capsule Take 1 capsule (200 mg total) by mouth 2 (two) times daily as needed for cough. Patient not taking: Reported on 03/10/2024 09/06/23   Wallace Joesph A, PA  carvedilol  (COREG ) 12.5 MG tablet TAKE 1 TABLET(12.5 MG) BY MOUTH TWICE DAILY WITH A MEAL 09/06/23   Wallace Joesph A, PA  cephALEXin  (KEFLEX ) 500  MG capsule Take 1 capsule (500 mg total) by mouth 2 (two) times daily for 7 days. 03/14/24 03/21/24  Koraline Phillipson, PA-C  Docusate Calcium  (STOOL SOFTENER PO) Take by mouth.    [provider]  eszopiclone  (LUNESTA ) 1 MG TABS tablet TAKE 1 TABLET(1 MG) BY MOUTH AT BEDTIME AS NEEDED FOR SLEEP 09/17/23   Wallace Joesph A, PA  fluticasone  furoate-vilanterol (BREO ELLIPTA ) 100-25 MCG/ACT AEPB Inhale 1 puff into the lungs daily. 11/27/23   Parrett, Madelin RAMAN, NP  losartan -hydrochlorothiazide  (HYZAAR) 100-25 MG tablet Take 1 tablet by mouth daily. 09/06/23   [name], PA  meloxicam  (MOBIC ) 7.5 MG tablet TAKE 1 TABLET(7.5 MG) BY MOUTH DAILY 01/02/24   Randol Simmonds, MD  Multiple Vitamins-Minerals (MULTIVITAMIN WITH MINERALS) tablet Take 1 tablet by mouth daily.    [provider]  Omega-3 Fatty Acids (FISH OIL) 1200 MG CAPS Take 1,200 mg by mouth daily.    [provider]  sildenafil  (VIAGRA ) 25 MG tablet TAKE 1 TABLET(25 MG) BY MOUTH DAILY AS NEEDED FOR ERECTILE DYSFUNCTION 09/17/23   [name], PA    Allergies: Patient has no known allergies.    Review of Systems  Constitutional:  Positive for chills. Negative for fever.  Respiratory:  Negative for shortness of breath.   Cardiovascular:  Positive for leg swelling.  Negative for chest pain.  Gastrointestinal:  Negative for abdominal pain, nausea and vomiting.  Genitourinary:  Negative for flank pain.  Musculoskeletal:  Negative for back pain.  Skin:  Positive for wound.  All other systems reviewed and are negative.   Updated Vital Signs BP 103/63   Pulse 66   Temp 98.6 F (37 C)   Resp 16   SpO2 95%   Physical Exam Vitals and nursing note reviewed.  Constitutional:      Appearance: He is well-developed.  HENT:     Head: Normocephalic and atraumatic.  Eyes:     General: No scleral icterus.    Pupils: Pupils are equal, round, and reactive to light.  Cardiovascular:     Heart sounds: Normal heart sounds.      Comments: MOZ:Zmbuyzfj, swelling, 2+ pitting edema. Pulmonary:     Effort: Pulmonary effort is normal.     Breath sounds: Normal breath sounds. No wheezing.  Chest:     Chest wall: No tenderness.  Abdominal:     General: Bowel sounds are normal. There is no distension.     Palpations: Abdomen is soft.     Tenderness: There is no abdominal tenderness.  Musculoskeletal:        General: No tenderness or deformity.     Cervical back: Normal range of motion.     Right lower leg: 2+ Edema present.  Skin:    General: Skin is warm and dry.  Neurological:     Mental Status: He is alert and oriented to person, place, and time.     (all labs ordered are listed, but only abnormal results are displayed) Labs Reviewed  COMPREHENSIVE METABOLIC PANEL WITH GFR - Abnormal; Notable for the following components:      Result Value   Potassium 3.1 (*)    Glucose, Bld 136 (*)    BUN 27 (*)    Creatinine, Ser 1.44 (*)    ALT 52 (*)    GFR, Estimated 55 (*)    All other components within normal limits  CBC WITH DIFFERENTIAL/PLATELET - Abnormal; Notable for the following components:   Platelets 142 (*)    All other components within normal limits  PRO BRAIN NATRIURETIC PEPTIDE - Abnormal; Notable for the following components:   Pro Brain Natriuretic Peptide 375.0 (*)    All other components within normal limits  CULTURE, BLOOD (ROUTINE X 2)  CULTURE, BLOOD (ROUTINE X 2)  LACTIC ACID, PLASMA  LACTIC ACID, PLASMA    EKG: None  Radiology: US  Venous Img Lower Unilateral Right (DVT) Result Date: 03/14/2024 CLINICAL DATA:  Leg swelling EXAM: Right LOWER EXTREMITY VENOUS DOPPLER ULTRASOUND TECHNIQUE: Gray-scale sonography with compression, as well as color and duplex ultrasound, were performed to evaluate the deep venous system(s) from the level of the common femoral vein through the popliteal and proximal calf veins. COMPARISON:  None Available. FINDINGS: VENOUS Normal compressibility of the  common femoral, superficial femoral, and popliteal veins, as well as the visualized calf veins. Visualized portions of profunda femoral vein and great saphenous vein unremarkable. No filling defects to suggest DVT on grayscale or color Doppler imaging. Doppler waveforms show normal direction of venous flow, normal respiratory plasticity and response to augmentation. Limited views of the contralateral common femoral vein are unremarkable. OTHER None. Limitations: none IMPRESSION: No evidence of right lower extremity DVT. Electronically Signed   By: Ranell Bring M.D.   On: 03/14/2024 14:17     Procedures   Medications Ordered in  the ED  potassium chloride  SA (KLOR-CON  M) CR tablet 20 mEq (20 mEq Oral Given 03/14/24 1613)                                    Medical Decision Making Amount and/or Complexity of Data Reviewed Labs: ordered.  Risk Prescription drug management.   This patient presents to the ED for concern of right leg swelling, this involves a number of treatment options, and is a complaint that carries with it a high risk of complications and morbidity.  The differential diagnosis includes DVT, cellulitis, heart failure.    Co morbidities: Discussed in HPI   Brief History:  See HPI.   EMR reviewed including pt PMHx, past surgical history and past visits to ER.   See HPI for more details   Lab Tests:  I ordered and independently interpreted labs.  The pertinent results include:    Labs notable for CBC with no leukocytosis, hemoglobin is within normal limits.  CMP with slight hypokalemia noted, orally replaced on today's visit.  Creatinine level slightly elevated and BUN is slightly elevated, patient does work 12 hours a day, reports lack of oral intake, we discussed increasing his fluids.   Imaging Studies:  Ultrasound of his right leg did not show any DVT or thrombophlebitis noted.  Medicines ordered:  I ordered medication including potassium for  hypokalemia Reevaluation of the patient after these medicines showed that the patient improved I have reviewed the patients home medicines and have made adjustments as needed  Reevaluation:  After the interventions noted above I re-evaluated patient and found that they have :stayed the same  Social Determinants of Health:  The patient's social determinants of health were a factor in the care of this patient  Problem List / ED Course:  Patient presented to the ED with a chief complaint of right lower leg which has been ongoing for the past 3 days.  She also reports some chills, no fever at home.  He does report a lot of standing for work, does have a small wound located to the right inner leg which looks like he has picked at.  Some suspicion for cellulitis at this time which streaking, erythema to the skin.  There is also 2+ pitting edema, I do suspect some venous stasis component. His exam is benign, he is neurovascularly intact otherwise, good pulses, good plantarflexion and dorsiflexion.  Blood work is unremarkable, CBC without any leukocytosis.  Lactic acid is negative.  CMP remarkable for hypokalemia, therefore orally replaced while in the emergency department.  DVT study is negative today.  BNP is normal for his age, denies any shortness of breath, denies any chest pain.  I have a low suspicion for heart failure component.  Some suspicion for superimposed cellulitis on the right lower leg, we discussed a short course treatment with Keflex  twice daily for the next 7 days.  He is following up with PCP in about 1 week, we discussed further follow-up with vein specialist.  He is agreeable to plan and treatment at this time, return precautions discussed at length.  Patient hemodynamically stable for discharge.  Dispostion:  After consideration of the diagnostic results and the patients response to treatment, I feel that the patent would benefit from treatment of cellulitis to the right lower leg  with Keflex  for the next 7 days.       Final diagnoses:  Right leg  swelling    ED Discharge Orders          Ordered    cephALEXin  (KEFLEX ) 500 MG capsule  2 times daily,   Status:  Discontinued        03/14/24 1614    cephALEXin  (KEFLEX ) 500 MG capsule  2 times daily        03/14/24 1615               Azahel Belcastro, PA-C 03/14/24 1620    Dreama Longs, MD 03/15/24 0730

## 2024-03-17 ENCOUNTER — Other Ambulatory Visit: Payer: Self-pay

## 2024-03-17 DIAGNOSIS — Z1321 Encounter for screening for nutritional disorder: Secondary | ICD-10-CM

## 2024-03-17 DIAGNOSIS — R7303 Prediabetes: Secondary | ICD-10-CM

## 2024-03-17 DIAGNOSIS — E66813 Obesity, class 3: Secondary | ICD-10-CM

## 2024-03-17 DIAGNOSIS — E785 Hyperlipidemia, unspecified: Secondary | ICD-10-CM

## 2024-03-17 DIAGNOSIS — I10 Essential (primary) hypertension: Secondary | ICD-10-CM

## 2024-03-17 NOTE — Addendum Note (Signed)
 Addended by: ZIMMERMAN RUMPLE, Yochanan Eddleman D on: 03/17/2024 05:31 PM   Modules accepted: Orders

## 2024-03-18 ENCOUNTER — Other Ambulatory Visit

## 2024-03-18 DIAGNOSIS — I10 Essential (primary) hypertension: Secondary | ICD-10-CM

## 2024-03-18 DIAGNOSIS — R7303 Prediabetes: Secondary | ICD-10-CM

## 2024-03-18 DIAGNOSIS — Z1321 Encounter for screening for nutritional disorder: Secondary | ICD-10-CM

## 2024-03-18 DIAGNOSIS — E785 Hyperlipidemia, unspecified: Secondary | ICD-10-CM

## 2024-03-18 DIAGNOSIS — E66813 Obesity, class 3: Secondary | ICD-10-CM

## 2024-03-19 ENCOUNTER — Ambulatory Visit: Payer: Self-pay

## 2024-03-19 LAB — CBC WITH DIFFERENTIAL/PLATELET
Basophils Absolute: 0.1 x10E3/uL (ref 0.0–0.2)
Basos: 1 %
EOS (ABSOLUTE): 0.3 x10E3/uL (ref 0.0–0.4)
Eos: 3 %
Hematocrit: 45.4 % (ref 37.5–51.0)
Hemoglobin: 15.1 g/dL (ref 13.0–17.7)
Immature Grans (Abs): 0 x10E3/uL (ref 0.0–0.1)
Immature Granulocytes: 0 %
Lymphocytes Absolute: 1.9 x10E3/uL (ref 0.7–3.1)
Lymphs: 24 %
MCH: 32.5 pg (ref 26.6–33.0)
MCHC: 33.3 g/dL (ref 31.5–35.7)
MCV: 98 fL — ABNORMAL HIGH (ref 79–97)
Monocytes Absolute: 0.7 x10E3/uL (ref 0.1–0.9)
Monocytes: 8 %
Neutrophils Absolute: 5 x10E3/uL (ref 1.4–7.0)
Neutrophils: 64 %
Platelets: 224 x10E3/uL (ref 150–450)
RBC: 4.65 x10E6/uL (ref 4.14–5.80)
RDW: 13 % (ref 11.6–15.4)
WBC: 7.9 x10E3/uL (ref 3.4–10.8)

## 2024-03-19 LAB — CULTURE, BLOOD (ROUTINE X 2)
Culture: NO GROWTH
Culture: NO GROWTH
Special Requests: ADEQUATE

## 2024-03-19 LAB — COMPREHENSIVE METABOLIC PANEL WITH GFR
ALT: 65 IU/L — ABNORMAL HIGH (ref 0–44)
AST: 72 IU/L — ABNORMAL HIGH (ref 0–40)
Albumin: 4.3 g/dL (ref 3.9–4.9)
Alkaline Phosphatase: 122 IU/L — ABNORMAL HIGH (ref 44–121)
BUN/Creatinine Ratio: 19 (ref 10–24)
BUN: 26 mg/dL (ref 8–27)
Bilirubin Total: 0.8 mg/dL (ref 0.0–1.2)
CO2: 23 mmol/L (ref 20–29)
Calcium: 9.8 mg/dL (ref 8.6–10.2)
Chloride: 97 mmol/L (ref 96–106)
Creatinine, Ser: 1.36 mg/dL — ABNORMAL HIGH (ref 0.76–1.27)
Globulin, Total: 3.3 g/dL (ref 1.5–4.5)
Glucose: 124 mg/dL — ABNORMAL HIGH (ref 70–99)
Potassium: 4.4 mmol/L (ref 3.5–5.2)
Sodium: 138 mmol/L (ref 134–144)
Total Protein: 7.6 g/dL (ref 6.0–8.5)
eGFR: 59 mL/min/1.73 — ABNORMAL LOW (ref >59)

## 2024-03-19 LAB — HEMOGLOBIN A1C
Est. average glucose Bld gHb Est-mCnc: 123 mg/dL
Hgb A1c MFr Bld: 5.9 % — ABNORMAL HIGH (ref 4.8–5.6)

## 2024-03-19 LAB — LIPID PANEL
Chol/HDL Ratio: 3.5 ratio (ref 0.0–5.0)
Cholesterol, Total: 159 mg/dL (ref 100–199)
HDL: 45 mg/dL (ref >39)
LDL Chol Calc (NIH): 92 mg/dL (ref 0–99)
Triglycerides: 120 mg/dL (ref 0–149)
VLDL Cholesterol Cal: 22 mg/dL (ref 5–40)

## 2024-03-19 LAB — TSH: TSH: 2.69 u[IU]/mL (ref 0.450–4.500)

## 2024-03-19 LAB — VITAMIN D 25 HYDROXY (VIT D DEFICIENCY, FRACTURES): Vit D, 25-Hydroxy: 31.1 ng/mL (ref 30.0–100.0)

## 2024-03-21 ENCOUNTER — Ambulatory Visit

## 2024-03-21 VITALS — BP 119/76 | HR 56 | Ht 69.0 in | Wt 311.8 lb

## 2024-03-21 DIAGNOSIS — E785 Hyperlipidemia, unspecified: Secondary | ICD-10-CM | POA: Diagnosis not present

## 2024-03-21 DIAGNOSIS — R7303 Prediabetes: Secondary | ICD-10-CM | POA: Diagnosis not present

## 2024-03-21 DIAGNOSIS — I10 Essential (primary) hypertension: Secondary | ICD-10-CM

## 2024-03-21 DIAGNOSIS — L03115 Cellulitis of right lower limb: Secondary | ICD-10-CM | POA: Diagnosis not present

## 2024-03-21 DIAGNOSIS — N529 Male erectile dysfunction, unspecified: Secondary | ICD-10-CM

## 2024-03-21 DIAGNOSIS — M1712 Unilateral primary osteoarthritis, left knee: Secondary | ICD-10-CM

## 2024-03-21 DIAGNOSIS — L0390 Cellulitis, unspecified: Secondary | ICD-10-CM | POA: Insufficient documentation

## 2024-03-21 MED ORDER — LOSARTAN POTASSIUM-HCTZ 100-25 MG PO TABS: 1.0000 | ORAL_TABLET | Freq: Every day | ORAL | 1 refills | Status: DC

## 2024-03-21 MED ORDER — CEPHALEXIN 500 MG PO CAPS: 500.0000 mg | ORAL_CAPSULE | Freq: Two times a day (BID) | ORAL | 0 refills | Status: AC

## 2024-03-21 MED ORDER — MELOXICAM 7.5 MG PO TABS: ORAL_TABLET | ORAL | 1 refills | Status: DC

## 2024-03-21 MED ORDER — CARVEDILOL 12.5 MG PO TABS: ORAL_TABLET | ORAL | 1 refills | Status: DC

## 2024-03-21 MED ORDER — ATORVASTATIN CALCIUM 40 MG PO TABS: 40.0000 mg | ORAL_TABLET | Freq: Every day | ORAL | 1 refills | Status: DC

## 2024-03-21 MED ORDER — SILDENAFIL CITRATE 25 MG PO TABS
ORAL_TABLET | ORAL | 0 refills | Status: AC
Start: 2024-03-21 — End: ?

## 2024-03-21 MED ORDER — AMLODIPINE BESYLATE 10 MG PO TABS: ORAL_TABLET | ORAL | 1 refills | Status: DC

## 2024-03-21 NOTE — Assessment & Plan Note (Signed)
 Patient has been on 500 mg of Keflex  twice daily for 7 days.  Last dose was tonight.  Given that the weekend is coming up and her office will be closed and the patient still has lingering swelling/erythema, suspect that cellulitis is improving but patient will require 3 more days of antibiotics.  Extending his course of Keflex  500 mg twice daily for 3 days to ensure completely resolved infection.  Advised patient to follow-up if redness or swelling or pain worsens by Monday.  Will continue to monitor.

## 2024-03-21 NOTE — Assessment & Plan Note (Signed)
 Last lipid panel: LDL 92, HDL 45, triglycerides 120.  Continue atorvastatin  40 mg daily.  Will continue to monitor.

## 2024-03-21 NOTE — Progress Notes (Signed)
 Established Patient Office Visit  Subjective   Patient ID: Keith Vance, male    DOB: [birth year]  Age: 63 y.o. MRN: [id number]  Chief Complaint  Patient presents with   Medical Management of Chronic Issues    HPI  Keith Vance is a 63 y.o. y/o male who presents to the clinic today for follow up on HTN, HLD, preDM, hospital follow up.   HTN: Patient currently taking amlodipine  10 mg, carvedilol  12.5 mg, losartan -HCTZ 100-25 mg for control of their blood pressure. Reports excellence compliance with this medication. Denies side effects or episodes of hypotension. Checks BP at home periodically. Denies CP, SOB, Palpitations, vision changes, HA, or edema.  HLD: Patient currently taking atorvastatin  40 mg daily for their cholesterol. Reports excellent compliance with this medication. Denies side effects/new myalgias.   preDM: Patient not currently taking any glucose lowering medications.  Controlling with diet.  Reports that he is cutting back on foods high in carbs and sugar.   Hospital follow up: Patient was seen in the ED on 03/14/2024 with complaints of right leg swelling.  He also reported increased tenderness and redness to the area.  After venous duplex ruled out DVT, patient was diagnosed with cellulitis and treated with a course of Keflex .  Patient reports that he has been taking this medication as prescribed and has not missed any doses. Reports that his pain has improved greatly with the course of Keflex . Still dealing with swelling in the lower extremity and lingering erythema. Last dose of Keflex  tonight. Denies fevers.    ROS Per HPI.    Objective:     BP 119/76   Pulse (!) 56   Ht 5' 9 (1.753 m)   Wt (!) 311 lb 12 oz (141.4 kg)   SpO2 96%   BMI 46.04 kg/m    Physical Exam Constitutional:      General: He is not in acute distress.    Appearance: Normal appearance.  Cardiovascular:     Rate and Rhythm: Normal rate and regular rhythm.     Heart sounds: Normal  heart sounds. No murmur heard.    No friction rub. No gallop.  Pulmonary:     Effort: Pulmonary effort is normal. No respiratory distress.     Breath sounds: Normal breath sounds.  Musculoskeletal:        General: No swelling.  Skin:    General: Skin is warm and dry.     Findings: Erythema (Right lower extremity, mild) present.  Neurological:     General: No focal deficit present.     Mental Status: He is alert.  Psychiatric:        Mood and Affect: Mood normal.        Behavior: Behavior normal.        Thought Content: Thought content normal.    No results found for any visits on 03/21/24.  Last CBC Lab Results  Component Value Date   WBC 7.9 03/18/2024   HGB 15.1 03/18/2024   HCT 45.4 03/18/2024   MCV 98 (H) 03/18/2024   MCH 32.5 03/18/2024   RDW 13.0 03/18/2024   PLT 224 03/18/2024   Last metabolic panel Lab Results  Component Value Date   GLUCOSE 124 (H) 03/18/2024   NA 138 03/18/2024   K 4.4 03/18/2024   CL 97 03/18/2024   CO2 23 03/18/2024   BUN 26 03/18/2024   CREATININE 1.36 (H) 03/18/2024   EGFR 59 (L) 03/18/2024   CALCIUM  9.8 03/18/2024  PHOS 3.8 10/12/2018   PROT 7.6 03/18/2024   ALBUMIN 4.3 03/18/2024   LABGLOB 3.3 03/18/2024   AGRATIO 1.6 02/05/2023   BILITOT 0.8 03/18/2024   ALKPHOS 122 (H) 03/18/2024   AST 72 (H) 03/18/2024   ALT 65 (H) 03/18/2024   ANIONGAP 15 03/14/2024   Last lipids Lab Results  Component Value Date   CHOL 159 03/18/2024   HDL 45 03/18/2024   LDLCALC 92 03/18/2024   TRIG 120 03/18/2024   CHOLHDL 3.5 03/18/2024   Last hemoglobin A1c Lab Results  Component Value Date   HGBA1C 5.9 (H) 03/18/2024   Last thyroid  functions Lab Results  Component Value Date   TSH 2.690 03/18/2024   Last vitamin D  Lab Results  Component Value Date   VD25OH 31.1 03/18/2024      The 10-year ASCVD risk score (Arnett DK, et al., 2019) is: 9.2%    Assessment & Plan:   Pre-diabetes Assessment & Plan: A1c stable at 5.9.  Not  currently on any glucose lowering medications.  Continue controlling with diet and exercise as tolerated.  Will continue to monitor.   Essential hypertension Assessment & Plan: BP goal <130/80.  Blood pressure at goal today in office.  Continue losartan -HCTZ 100-25 mg daily, amlodipine  10 mg daily, carvedilol  12.5 mg daily.  Recommend to continue periodic ambulatory blood pressure monitoring.  Will continue to monitor.  Orders: -     amLODIPine  Besylate; TAKE 1 TABLET(10 MG) BY MOUTH DAILY  Dispense: 90 tablet; Refill: 1 -     Carvedilol ; TAKE 1 TABLET(12.5 MG) BY MOUTH TWICE DAILY WITH A MEAL  Dispense: 180 tablet; Refill: 1 -     Losartan  Potassium-HCTZ; Take 1 tablet by mouth daily.  Dispense: 90 tablet; Refill: 1  Dyslipidemia, goal LDL below 70 Assessment & Plan: Last lipid panel: LDL 92, HDL 45, triglycerides 120.  Continue atorvastatin  40 mg daily.  Will continue to monitor.  Orders: -     Atorvastatin  Calcium ; Take 1 tablet (40 mg total) by mouth daily.  Dispense: 90 tablet; Refill: 1  Erectile dysfunction, unspecified erectile dysfunction type -     Sildenafil  Citrate; TAKE 1 TABLET(25 MG) BY MOUTH DAILY AS NEEDED FOR ERECTILE DYSFUNCTION  Dispense: 10 tablet; Refill: 0  Primary osteoarthritis of left knee -     Meloxicam ; TAKE 1 TABLET(7.5 MG) BY MOUTH DAILY  Dispense: 30 tablet; Refill: 1  Cellulitis of right lower extremity Assessment & Plan: Patient has been on 500 mg of Keflex  twice daily for 7 days.  Last dose was tonight.  Given that the weekend is coming up and her office will be closed and the patient still has lingering swelling/erythema, suspect that cellulitis is improving but patient will require 3 more days of antibiotics.  Extending his course of Keflex  500 mg twice daily for 3 days to ensure completely resolved infection.  Advised patient to follow-up if redness or swelling or pain worsens by Monday.  Will continue to monitor.   Other orders -     Cephalexin ;  Take 1 capsule (500 mg total) by mouth 2 (two) times daily for 3 days.  Dispense: 6 capsule; Refill: 0    Return in about 6 months (around 09/21/2024) for HTN, HLD, pre-DM.    Saddie JULIANNA Sacks, PA-C

## 2024-03-21 NOTE — Assessment & Plan Note (Signed)
 BP goal <130/80.  Blood pressure at goal today in office.  Continue losartan -HCTZ 100-25 mg daily, amlodipine  10 mg daily, carvedilol  12.5 mg daily.  Recommend to continue periodic ambulatory blood pressure monitoring.  Will continue to monitor.

## 2024-03-21 NOTE — Patient Instructions (Signed)
 It was nice to see you today!  As we discussed in clinic:  -I have sent your refills next door to [facility]! -I have extended your course of Keflex  for 3 days to ensure that the cellulitis is completely knocked out over the weekend  -Continue all of your current medications as prescribed  -I will see you back in 6 months for a follow up with labs the week before!  If you have any problems before your next visit feel free to message me via MyChart (minor issues or questions) or call the office, otherwise you may reach out to schedule an office visit.  Thank you! Saddie Sacks, PA-C

## 2024-03-21 NOTE — Assessment & Plan Note (Signed)
 A1c stable at 5.9.  Not currently on any glucose lowering medications.  Continue controlling with diet and exercise as tolerated.  Will continue to monitor.

## 2024-03-24 ENCOUNTER — Ambulatory Visit

## 2024-03-25 NOTE — Progress Notes (Signed)
 Office Visit Note  Patient: Keith Vance             Date of Birth: 08-Jan-1961           MRN: [id number]             PCP: Gayle Saddie JULIANNA DEVONNA Referring: [name], PA Visit Date: 03/26/2024   Subjective:  Follow-up (Patient states he has to pry his hands open in the morning. )   Discussed the use of AI scribe software for clinical note transcription with the patient, who gave verbal consent to proceed.  History of Present Illness   Keith Vance is a 63 year old male with osteoarthritis who presents with joint pain and swelling.  He experiences persistent joint pain and swelling, particularly in his knees and fingers. His artificial knee has not been successful, and both knees remain swollen, with the right knee being worse. He has difficulty with activities such as putting on shoes due to knee swelling.  He describes morning stiffness in his fingers, particularly in three fingers, which requires him to manually open them. This stiffness improves as the day progresses but worsens after periods of inactivity, such as watching TV. He finds relief by applying ice to his knees after work.  He is currently taking meloxicam  7.5 mg daily for inflammation, which he believes is helping. He has also tried magnesium  oil on his hands to alleviate pain.  A recent blood test showed a slightly elevated sedimentation rate of 38, and tests for rheumatoid arthritis markers were negative.       Previous HPI 12/20/23 Keith Vance is a 63 year old male who presents for evaluation of potential inflammatory arthritis. He was referred by his pulmonologist, Madelin Passy, for evaluation of lung nodules without clear etiology and joint problems.   He has lung nodules identified on imaging, accompanied by significant nocturnal dyspnea with oxygen  saturation levels dropping to 78-80%. He uses inhalers to manage these symptoms. A persistent cough lasting eight months has improved with  over-the-counter cough syrup and medication. 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   He has a history of joint problems, particularly in his knees. A left knee replacement required revision surgery due to poor outcomes, yet he experiences worse pain than before. 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 He is hesitant to undergo further surgery unless necessary. Significant pain in the right knee affects his ability to dress and perform daily activities. He also reports pain in the left shoulder, elbow, hands, and feet, but not in the right shoulder or elbow. Pain management includes pain sap used two to three times daily, meloxicam , and ibuprofen. He has had fluid drained from his knees in the past, with the last procedure occurring about a year ago.   His joint issues are exacerbated by a history of handling horses, which involved numerous injuries, including broken ribs. He can no longer ride horses due to pain. Significant swelling occurs in his legs, particularly the right leg, which swells more than the left. No history of blood clots or varicose veins.   No recent injections or surgeries for his joint issues and he has not been on medications like duloxetine , gabapentin , or Lyrica.    Review of Systems  Constitutional:  Positive for fatigue.  HENT:  Negative for mouth sores and mouth dryness.   Eyes:  Negative for dryness.  Respiratory:  Positive for shortness of breath.   Cardiovascular:  Negative for chest pain and palpitations.  Gastrointestinal:  Negative for blood in stool, constipation and diarrhea.  Endocrine: Negative for increased urination.  Genitourinary:  Negative for involuntary urination.  Musculoskeletal:  Positive for joint pain, gait problem, joint pain, joint swelling, myalgias, morning stiffness, muscle tenderness and myalgias.  Negative for muscle weakness.  Skin:  Positive for color change. Negative for rash, hair loss and sensitivity to sunlight.  Allergic/Immunologic: Negative for susceptible to infections.  Neurological:  Negative for dizziness and headaches.  Hematological:  Negative for swollen glands.  Psychiatric/Behavioral:  Positive for sleep disturbance. Negative for depressed mood. The patient is not nervous/anxious.     PMFS History:  Patient Active Problem List   Diagnosis Date Noted   Cellulitis 03/21/2024   Lymphadenopathy, mediastinal 11/27/2023   Asthma 11/27/2023   Pulmonary nodule 04/05/2023   Hemoptysis 04/05/2023   Other insomnia 02/12/2023   Vasomotor rhinitis 02/26/2022   Class 3 severe obesity with serious comorbidity and body mass index (BMI) of 40.0 to 44.9 in adult 03/17/2021   Elevated coronary artery calcium  score 12/03/2020   Pre-diabetes 02/24/2019   Vitamin D  deficiency 02/24/2019   Dyslipidemia, goal LDL below 70 03/18/2018   Morbid obesity (HCC) 02/27/2018   NAFLD (nonalcoholic fatty liver disease) 92/96/7980   Primary osteoarthritis of left knee 07/30/2017   Thoracic aortic aneurysm (HCC) 01/07/2016   Essential hypertension 11/14/2015   Sleep apnea 03/07/2012    Past Medical History:  Diagnosis Date   Arthritis    Ascending aortic aneurysm (HCC)    a. 10/2015 CTA chest: 4.4cm aneurysmal dil of Asc Ao-->rec f/u in 1 yr.   Back pain    Cellulitis    Chest pain 10/11/2018   Fatty liver    GERD (gastroesophageal reflux disease)    hx   Heart disease    History of kidney stones    Hyperlipidemia    Hypertension    Joint pain    Liver disease    Lower extremity edema    Obesity    Pneumonia    hx   Prediabetes    Rheumatoid arthritis (HCC)    SBO (small bowel obstruction) (HCC)    a. 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   Sleep apnea    cpap    Family History  Problem Relation Age of Onset   Cancer Mother    Hypertension Mother    Hyperlipidemia Mother    Stroke  Mother    Heart disease Mother    Depression Mother    Hypertension Father    Diabetes Father    Cancer Father        bone   Hyperlipidemia Father    Heart attack Maternal Grandmother        >5s of age   Past Surgical History:  Procedure Laterality Date   HERNIA REPAIR Left    inguinal, and umbilical   JOINT REPLACEMENT     REVISION TOTAL KNEE ARTHROPLASTY Left 07/30/2017   LEFT TOTAL KNEE PARTIAL REVISION AND SYNOVECTOMY/notes 07/30/2017   TOTAL KNEE ARTHROPLASTY Left 04/24/2016   Procedure: LEFT TOTAL KNEE ARTHROPLASTY;  Surgeon: Donnice Car, MD;  Location: [facility];  Service: Orthopedics;  Laterality: Left;   TOTAL KNEE REVISION Left 07/30/2017   Procedure: LEFT TOTAL KNEE PARTIAL REVISION AND SYNOVECTOMY;  Surgeon: Liam Lerner, MD;  Location: [facility];  Service: Orthopedics;  Laterality: Left;   Social History   Social  History Narrative   Not on file   Immunization History  Administered Date(s) Administered   Hepatitis B, ADULT 09/09/2013, 10/10/2013, 03/10/2014   Influenza Inj Mdck Quad Pf 06/09/2016, 05/17/2018   Influenza,inj,Quad PF,6+ Mos 06/08/2017, 05/29/2019, 05/10/2022   Influenza-Unspecified 05/25/2021   PFIZER(Purple Top)SARS-COV-2 Vaccination 09/18/2019, 10/09/2019, 07/08/2020   Pneumococcal Polysaccharide-23 02/28/2015   Tdap 12/14/2021     Objective: Vital Signs: BP 138/87 (BP Location: Left Arm, Patient Position: Sitting, Cuff Size: Large)   Pulse 68   Resp 16   Ht 5' 9.5 (1.765 m)   Wt (!) 311 lb (141.1 kg)   BMI 45.27 kg/m    Physical Exam Constitutional:      Appearance: He is obese.  Eyes:     Conjunctiva/sclera: Conjunctivae normal.  Cardiovascular:     Rate and Rhythm: Normal rate and regular rhythm.  Pulmonary:     Effort: Pulmonary effort is normal.     Breath sounds: Normal breath sounds.  Skin:    General: Skin is warm and dry.  Neurological:     Mental Status: He is alert.  Psychiatric:        Mood and Affect: Mood normal.       Musculoskeletal Exam:  Shoulders full ROM no tenderness or swelling Elbows full ROM, left elbow tenderness to pressure without palpable swelling Wrists full ROM no tenderness or swelling Fingers full ROM no tenderness or swelling To medial joint line tenderness to pressure, chronic knee change with some widening right greater than left but no large palpable effusion Ankles full ROM no tenderness or swelling MTPs full ROM no tenderness or swelling  Investigation: No additional findings.  Imaging: US  Venous Img Lower Unilateral Right (DVT) Result Date: 03/14/2024 CLINICAL DATA:  Leg swelling EXAM: Right LOWER EXTREMITY VENOUS DOPPLER ULTRASOUND TECHNIQUE: Gray-scale sonography with compression, as well as color and duplex ultrasound, were performed to evaluate the deep venous system(s) from the level of the common femoral vein through the popliteal and proximal calf veins. COMPARISON:  None Available. FINDINGS: VENOUS Normal compressibility of the common femoral, superficial femoral, and popliteal veins, as well as the visualized calf veins. Visualized portions of profunda femoral vein and great saphenous vein unremarkable. No filling defects to suggest DVT on grayscale or color Doppler imaging. Doppler waveforms show normal direction of venous flow, normal respiratory plasticity and response to augmentation. Limited views of the contralateral common femoral vein are unremarkable. OTHER None. Limitations: none IMPRESSION: No evidence of right lower extremity DVT. Electronically Signed   By: Ranell Bring M.D.   On: 03/14/2024 14:17    Recent Labs: Lab Results  Component Value Date   WBC 7.9 03/18/2024   HGB 15.1 03/18/2024   PLT 224 03/18/2024   NA 138 03/18/2024   K 4.4 03/18/2024   CL 97 03/18/2024   CO2 23 03/18/2024   GLUCOSE 124 (H) 03/18/2024   BUN 26 03/18/2024   CREATININE 1.36 (H) 03/18/2024   BILITOT 0.8 03/18/2024   ALKPHOS 122 (H) 03/18/2024   AST 72 (H) 03/18/2024   ALT  65 (H) 03/18/2024   PROT 7.6 03/18/2024   ALBUMIN 4.3 03/18/2024   CALCIUM  9.8 03/18/2024   GFRAA 92 01/02/2020    Speciality Comments: No specialty comments available.  Procedures:  No procedures performed Allergies: Patient has no known allergies.   Assessment / Plan:     Visit Diagnoses: Knee swelling Bilateral knee osteoarthritis (status post right knee replacement) with chronic pain and swelling Chronic bilateral knee osteoarthritis with  persistent pain and swelling, especially in the right knee post-replacement. Left knee may require future replacement. - Continue meloxicam  7.5 mg daily due to cardiovascular concerns. - Initiate duloxetine  30 mg daily and plan titration to 60 mg as tolerable  for pain and stiffness management, safe with meloxicam , no cardiovascular risk. - Monitor for duloxetine  side effects: fluid retention, mood changes.  Generalized osteoarthritis of multiple sites - Plan: DULoxetine  (CYMBALTA ) 30 MG capsule Hand osteoarthritis with morning stiffness Hand osteoarthritis with morning stiffness, particularly in three fingers. Stiffness improves with movement. Elevated ESR at 38, indicating inflammation likely related to osteoarthritis.        Orders: No orders of the defined types were placed in this encounter.  Meds ordered this encounter  Medications   DULoxetine  (CYMBALTA ) 30 MG capsule    Sig: Take 1 capsule (30 mg total) by mouth daily for 30 days, THEN 1 capsule (30 mg total) 2 (two) times daily.    Dispense:  60 capsule    Refill:  2     Follow-Up Instructions: Return in about 3 months (around 06/26/2024) for OA CYM start f/u 3mos.   Lonni LELON Ester, MD  Note - This record has been created using [employer].  Chart creation errors have been sought, but may not always  have been located. Such creation errors do not reflect on  the standard of medical care.

## 2024-03-26 ENCOUNTER — Encounter: Payer: Self-pay | Admitting: Internal Medicine

## 2024-03-26 ENCOUNTER — Ambulatory Visit: Attending: Internal Medicine | Admitting: Internal Medicine

## 2024-03-26 VITALS — BP 138/87 | HR 68 | Resp 16 | Ht 69.5 in | Wt 311.0 lb

## 2024-03-26 DIAGNOSIS — M25469 Effusion, unspecified knee: Secondary | ICD-10-CM | POA: Diagnosis not present

## 2024-03-26 DIAGNOSIS — M25422 Effusion, left elbow: Secondary | ICD-10-CM | POA: Diagnosis not present

## 2024-03-26 DIAGNOSIS — M159 Polyosteoarthritis, unspecified: Secondary | ICD-10-CM | POA: Diagnosis not present

## 2024-03-26 MED ORDER — DULOXETINE HCL 30 MG PO CPEP: ORAL_CAPSULE | ORAL | 2 refills | Status: DC

## 2024-04-03 ENCOUNTER — Ambulatory Visit: Payer: Self-pay | Admitting: Adult Health

## 2024-04-03 ENCOUNTER — Telehealth: Payer: Self-pay | Admitting: Pulmonary Disease

## 2024-04-03 ENCOUNTER — Other Ambulatory Visit: Payer: Self-pay

## 2024-04-03 DIAGNOSIS — G4733 Obstructive sleep apnea (adult) (pediatric): Secondary | ICD-10-CM

## 2024-04-03 NOTE — Procedures (Signed)
 [facility] [address] [place], [place] [address] Tel: [phone]   Fax: [phone]  Titration Interpretation  Patient Name:  Keith Vance, Keith Vance Date:  03/05/2024 Referring Physician:  MADELIN PARRETT [phone]) %%startinterp%% Indications for Polysomnography The patient is a 63 year-old Male who is 5' 9 and weighs 310.0 lbs. His BMI equals 45.9.  A full night titration treatment study was performed.  Medication  No Data.   Polysomnogram Data A full night polysomnogram recorded the standard physiologic parameters including EEG, EOG, EMG, EKG, nasal and oral airflow.  Respiratory parameters of chest and abdominal movements were recorded with Respiratory Inductance Plethysmography belts.  Oxygen  saturation was recorded by pulse oximetry.   Sleep Architecture The total recording time of the polysomnogram was 491.0 minutes.  The total sleep time was 462.0 minutes.  The patient spent 3.1% of total sleep time in Stage N1, 71.2% in Stage N2, 0.0% in Stages N3, and 25.6% in REM.  Sleep latency was 5.0 minutes.  REM latency was 57.0 minutes.  Sleep Efficiency was 94.1%.  Wake after Sleep Onset time was 24.0 minutes.  Titration Summary The patient was titrated at pressures ranging from 17/2** cm/H20 with supplemental oxygen  at - up to 21.5/17/0- cm/H20 with supplemental oxygen  at -.  The last pressure used in the study was 21.5/17/0- cm/H20 with supplemental oxygen  at -.  Respiratory Events The polysomnogram revealed a presence of - obstructive, 1 central, and - mixed apneas resulting in an Apnea index of 0.1 events per hour.  There were 129 hypopneas (>=3% desaturation and/or arousal) resulting in an Apnea\Hypopnea Index (AHI >=3% desaturation and/or arousal) of 16.9 events per hour.  There were 84 hypopneas (>=4% desaturation) resulting in an Apnea\Hypopnea Index (AHI >=4% desaturation) of 11.0 events per hour.  There were 10 Respiratory Effort Related  Arousals resulting in a RERA index of 1.3 events per hour. The Respiratory Disturbance Index is 18.2 events per hour.  The snore index was - events per hour.  Mean oxygen  saturation was 91.6%.  The lowest oxygen  saturation during sleep was 82.0%.  Time spent <=88% oxygen  saturation was 55.0 minutes (11.2%).  Limb Activity There were 1 limb movements recorded.  Of this total, - were classified as PLMs.  Of the PLMs, - were associated with arousals.  The Limb Movement index was 0.1 per hour while the PLM index was - per hour.  Cardiac Summary The average pulse rate was 60.3 bpm.  The minimum pulse rate was 49.0 bpm while the maximum pulse rate was 83.0 bpm.  Cardiac rhythm was normal/abnormal.  Diagnosis:   Severe obstructive sleep apnea successfully titrated to CPAP of 17, EPR 2 Fisher [employer]kel Simplus full face mask,medium size. Excellent sleep efficiency with good sleep architecture No significant periodic limb movement Cardiac rhythm was sinus, some bradycardia noted  Recommendations: CPAP of 17 with EPR of 2 with heated humidification Fisher and Paykel Simplus fullface mask, medium size  Clinical follow-up as previously scheduled  Close clinical follow-up optimization of treatment  This study was personally reviewed and electronically signed by: Jennet Epley, MD Accredited Board Certified in Sleep Medicine Date/Time:  04/03/24  %%endinterp%%  Titration Report  Patient Name: Keith Vance, Keith Vance Date: 03/05/2024  Date of Birth: [birth year] Study Type: CPAP Titration  Age: 80 year MRN #: [id number]  Sex: Male Interpreting Physician: EPLEY JENNET, [id number]  Height: 5' 9 Referring Physician: MADELIN PARRETT [phone])  Weight: 310.0 lbs Recording Tech: Firman Anon RPSGT RST  BMI:  45.9 Scoring Tech: Firman Anon RPSGT RST  ESS: 0 Neck Size: 19  Mask Type Fisher & Paykel Simplus Final Pressure: 21/17  Mask Size: Medium Supplemental O2: N/A   Study Overview  Lights Off:  09:20:26 PM  Count Index  Lights On: 05:31:25 AM Awakenings: 16 2.1  Time in Bed: 491.0 min. Arousals: 82 10.6  Total Sleep Time: 462.0 min. AHI (>=3% Desat and/or Ar.): 130 16.9   Sleep Efficiency: 94.1% AHI (>=4% Desat): 85 11.0   Sleep Latency: 5.0 min. Limb Movements: 1 0.1  Wake After Sleep Onset: 24.0 min. Snore: - -  REM Latency from Sleep Onset: 57.0 min. Desaturations: 175 22.7     Minimum SpO2 TST: 82.0%    Sleep Architecture  % of Time in Bed Stages Time (mins) % Sleep Time  Wake 29.0   Stage N1 14.5 3.1%  Stage N2 329.0 71.2%  Stage N3 0.0 0.0%  REM 118.5 25.6%   Arousal Summary   NREM REM Sleep Index  Respiratory Arousals 49 3 52 6.8  PLM Arousals - - - -  Isolated Limb Movement Arousals - - - -  Snore Arousals - - - -  Spontaneous Arousals 21 9 30  3.9  Total 70 12 82 10.6   Limb Movement Summary   Count Index  Isolated Limb Movements 1 0.1  Periodic Limb Movements (PLMs) - -  Total Limb Movements 1 0.1    Respiratory Summary   By Sleep Stage By Body Position Total   NREM REM Supine Non-Supine   Time (min) 343.5 118.5 81.0 381.0 462.0         Obstructive Apnea - - - - -  Mixed Apnea - - - - -  Central Apnea 1 - 1 - 1  Total Apneas 1 - 1 - 1  Total Apnea Index 0.2 - 0.7 - 0.1         Hypopneas (>=3% Desat and/or Ar.) 66 63 21 108 129  AHI (>=3% Desat and/or Ar.) 11.7 31.9 16.3 17.0 16.9         Hypopneas (>=4% Desat) 50 34 16 68 84  AHI (>=4% Desat) 8.9 17.2 12.6 10.7 11.0          RERAs 10 - 1 9 10   RERA Index 1.7 - 0.7 1.4 1.3         RDI 13.4 31.9 17.0 18.4 18.2     Respiratory Event Durations   Apnea Hypopnea   NREM REM NREM REM  Average (seconds) 13.1 - 21.3 27.1  Maximum (seconds) 13.1 - 54.9 60.8    Oxygen  Saturation Summary   Wake NREM REM TST TIB  Average SpO2 91.4% 91.8% 90.9% 91.6% 91.6%  Minimum SpO2 84.0% 82.0% 84.0% 82.0% 82.0%  Maximum SpO2 97.0% 97.0% 97.0% 97.0% 97.0%   Oxygen  Saturation Distribution  Range (%)  Time in range (min) Time in range (%)  90.0 - 100.0 357.5 73.0%  80.0 - 90.0 132.2 27.0%  70.0 - 80.0 - -  60.0 - 70.0 - -  50.0 - 60.0 - -  0.0 - 50.0 - -  Time Spent <=88% SpO2  Range (%) Time in range (min) Time in range (%)  0.0 - 88.0 55.0 11.2%      Count Index  Desaturations 175 22.7    Cardiac Summary   Wake NREM REM Sleep Total  Average Pulse Rate (BPM) 66.4 60.4 58.8 60.0 60.3  Minimum Pulse Rate (BPM) 52.0 49.0 51.0 49.0 49.0  Maximum Pulse  Rate (BPM) 81.0 83.0 80.0 83.0 83.0   Pulse Rate Distribution:  Range (bpm) Time in range (min) Time in range (%)  0.0 - 40.0 - -  40.0 - 60.0 307.2 62.7%  60.0 - 80.0 179.2 36.6%  80.0 - 100.0 0.1 0.0%  100.0 - 120.0 - -  120.0 - 140.0 - -  140.0 - 200.0 - -   Titration Summary  PAP Device PAP Level O2 Level Time (min) Wake (min) NREM (min) REM (min) Sleep Eff% OA# CA# MA# Hyp# (>=3%) AHI (>=3%) Hyp# (>=4%) AHI (>=%4) RERA RDI SpO2 <=88% (min) Min SpO2 Mean SpO2 Ar. Index  - Off - 0.5 0.0 0.0 0.0 0.0%               CPAP EPR 17/2 - 38.0 3.5 18.5 16.0 90.8% - - - 9 15.7 7  12.2 -  15.7  1.8 87.0 91.8 7.0  CPAP EPR 18/2 - 121.5 10.0 99.5 12.0 91.8% - - - 15 8.1 10  5.4 1  8.6  6.5 85.0 92.5 5.4  CPAP EPR 19/2 - 48.5 0.5 13.5 34.5 99.0% - - - 19 23.8 4  5.0 -  23.8  0.4 88.0 93.1 3.8  CPAP 6 - 24.0 11.0 13.0 0.0 54.2% - - - 19 87.7 17  78.5 5  110.8  7.0 84.0 88.2 92.3  CPAP 8 - 15.0 0.0 15.0 0.0 100.0% - - - 26 104.0 21  84.0 3  116.0  6.2 82.0 88.6 96.0  CPAP 10 - 15.0 0.0 15.0 0.0 100.0% - - - 5 20.0 3  12.0 1  24.0  11.6 85.0 87.9 20.0  CPAP 12 - 23.5 2.0 11.5 10.0 91.5% - - - 8 22.3 6  16.7 -  22.3  5.8 84.0 89.8 8.4  CPAP 13 - 19.0 0.5 18.5 0.0 97.4% - - - 1 3.2 1  3.2 -  3.2  3.5 88.0 89.3 6.5  CPAP 14 - 23.0 0.0 23.0 0.0 100.0% - - - - - -  - -  -  0.5 88.0 90.4 -  CPAP 15 - 35.0 0.5 29.0 5.5 98.6% - 1 - 3 7.0 2  5.2 -  7.0  3.9 85.0 91.0 5.2  CPAP 16 - 33.5 0.0 5.0 28.5 100.0% - - - 20 35.8 12  21.5 -  35.8  5.6  85.0 90.5 3.6  CPAP 17 - 60.5 0.5 48.0 12.0 99.2% - - - 4 4.0 1  1.0 -  4.0  0.0 89.0 92.5 3.0  BiLevel 21.5/17/0 - 34.5 0.5 34.0 0.0 98.6% - - - - - -  - -  -  0.0 92.0 93.8 5.3   Hypnograms                           Technologist Comments  THE 33-YEAR-OLD MALE PATIENT PRESENTED TO THE SLEEP DISORDER CENTER FOR A CPAP TITRATION STUDY WITH A CHIEF OSA. THE PATIENT WAS FITTED WITH A MEDIUM FISHER & PAYKEL SIMPLUS FULL FACE MASK FOR PRE-CPAP TRIAL, WHICH WAS TOLERATED WELL. NO BEDTIME MEDICATIONS WERE SELF ADMINISTERED. THE LEAD PLACEMENT WAS INITIATED, THEN THE STUDY WAS BEGUN. THE CPAP TRIAL WAS INITIATED USING THE ABOVE MASK TYPE ON A PRESSURE OF 6cmH2O AND WAS TITRATED ON BIPAP 21/17cmH20 WITH AN EPR OF 2 AND HEATED HUMIDIFICATION. THE CPAP PRESSURE WAS INCREASED FOR SNORING, RESPIRATORY EVENTS AND RERAs UNTIL OPTIMAL PRESSURE WAS ACHIEVED. SUPPLEMENTAL OXYGEN  WAS NOT WARRANTED DURING THE STUDY.  NO PLMs - PLMAs WERE NOTED. NO RESTROOM VISITS WERE MADE. NO OBVIOUS PARASOMNIAs WERE OBSERVED. SINUS BRADYCARDIA WAS NOTED THROUGHOUT WITH QUESTIONABLE CARDIAC ARRHYTHMIAS WERE OBSERVED ON EPOCHS 176 AND 360. SPONTANEOUS AROUSALS WERE NOTED. THE PATIENT TOLERATED THE CPAP TRIAL WELL.

## 2024-04-03 NOTE — Procedures (Signed)
  Indications for Polysomnography The patient is a 63 year-old Male who is 5' 9 and weighs 310.0 lbs. His BMI equals 45.9.  A full night titration treatment study was performed.  MedicationNo Data. Polysomnogram Data A full night polysomnogram recorded the standard physiologic parameters including EEG, EOG, EMG, EKG, nasal and oral airflow.  Respiratory parameters of chest and abdominal movements were recorded with Respiratory Inductance Plethysmography belts.   Oxygen  saturation was recorded by pulse oximetry.  Sleep Architecture The total recording time of the polysomnogram was 491.0 minutes.  The total sleep time was 462.0 minutes.  The patient spent 3.1% of total sleep time in Stage N1, 71.2% in Stage N2, 0.0% in Stages N3, and 25.6% in REM.  Sleep latency was 5.0 minutes.   REM latency was 57.0 minutes.  Sleep Efficiency was 94.1%.  Wake after Sleep Onset time was 24.0 minutes.  Titration Summary The patient was titrated at pressures ranging from 17/2** cm/H20 with supplemental oxygen  at - up to 21.5/17/0*** cm/H20 with supplemental oxygen  at -.  The last pressure used in the study was 21.5/17/0*** cm/H20 with supplemental oxygen  at -.  Respiratory Events The polysomnogram revealed a presence of - obstructive, 1 central, and - mixed apneas resulting in an Apnea index of 0.1 events per hour.  There were 129 hypopneas (GreaterEqual to3% desaturation and/or arousal) resulting in an Apnea\Hypopnea Index (AHI  GreaterEqual to3% desaturation and/or arousal) of 16.9 events per hour.  There were 84 hypopneas (GreaterEqual to4% desaturation) resulting in an Apnea\Hypopnea Index (AHI GreaterEqual to4% desaturation) of 11.0 events per hour.  There were 10  Respiratory Effort Related Arousals resulting in a RERA index of 1.3 events per hour. The Respiratory Disturbance Index is 18.2 events per hour.  The snore index was - events per hour.  Mean oxygen  saturation was 91.6%.  The lowest oxygen  saturation  during sleep was 82.0%.  Time spent LessEqual to88% oxygen  saturation was  minutes ().  Limb Activity There were 1 limb movements recorded.  Of this total, - were classified as PLMs.  Of the PLMs, - were associated with arousals.  The Limb Movement index was 0.1 per hour while the PLM index was - per hour.  Cardiac Summary The average pulse rate was 60.3 bpm.  The minimum pulse rate was 49.0 bpm while the maximum pulse rate was 83.0 bpm.  Cardiac rhythm was normal/abnormal.  Diagnosis:  Severe obstructive sleep apnea successfully titrated to CPAP of 17, EPR 2 Fisher [employer] Paykel Simplus full face mask,medium size. Excellent sleep efficiency with good sleep architecture No significant periodic limb movement Cardiac rhythm was sinus, some bradycardia noted  Recommendations: CPAP of 17 with EPR of 2 with heated humidification Fisher and Paykel Simplus fullface mask, medium size  Clinical follow-up as previously scheduled  Close clinical follow-up optimization of treatment  This study was personally reviewed and electronically signed by: Jennet Epley, MD Accredited Board Certified in Sleep Medicine Date/Time: 04/03/24

## 2024-04-03 NOTE — Telephone Encounter (Signed)
 Call patient  Sleep study result  Date of study: 03/11/2024  Impression: Severe obstructive sleep apnea adequately treated with CPAP therapy CPAP was well-tolerated  Recommendation: CPAP of 17 with EPR of 2 with heated humidification Fisher and Paykel Simplus fullface mask, medium size  Follow-up as previously scheduled

## 2024-04-04 NOTE — Telephone Encounter (Signed)
 I called and spoke with the pt and notified of response from Dr Neda. He verbalized understanding. Referral sent for new settings.

## 2024-05-27 DIAGNOSIS — G4709 Other insomnia: Secondary | ICD-10-CM

## 2024-05-28 MED ORDER — ESZOPICLONE 1 MG PO TABS: 1.0000 mg | ORAL_TABLET | Freq: Every day | ORAL | 1 refills | Status: DC

## 2024-06-17 NOTE — Progress Notes (Signed)
 Office Visit Note  Patient: Keith Vance             Date of Birth: [birth year]           MRN: [id number]             PCP: Gayle Saddie FALCON, PA-C Referring: Gayle Saddie FALCON DEVONNA Visit Date: 06/30/2024   Subjective:  Pain (Joint Pain)   Discussed the use of AI scribe software for clinical note transcription with the patient, who gave verbal consent to proceed.  History of Present Illness   Keith Vance is a 63 y.o. male here for follow up with osteoarthritis who presents with joint pain and swelling.   He experiences persistent joint pain, particularly in his knees and wrists, despite ongoing treatment with low-dose meloxicam  and a trial of Cymbalta , which has not made a noticeable difference in his symptoms. Nocturnal pain in his knees and joints affects his sleep.  He describes a new onset of pain in his wrist, present for about four weeks, exacerbated by movements such as bending the hand down, and localized to the wrist without radiation up the arm. No recent trauma or unusual activity is noted. Wearing a wrist brace at night alleviates the pain, allowing him to sleep without disturbance.  His knees are described as 'terrible,' with worsening swelling and some redness around the ankle. He attributes some of the discomfort to his demanding work schedule, noting that he worked 89 hours last week and expects a similar workload this week.      Previous HPI 03/26/2024 Keith Vance is a 63 year old male with osteoarthritis who presents with joint pain and swelling.   He experiences persistent joint pain and swelling, particularly in his knees and fingers. His artificial knee has not been successful, and both knees remain swollen, with the right knee being worse. He has difficulty with activities such as putting on shoes due to knee swelling.   He describes morning stiffness in his fingers, particularly in three fingers, which requires him to manually open them. This stiffness improves  as the day progresses but worsens after periods of inactivity, such as watching TV. He finds relief by applying ice to his knees after work.   He is currently taking meloxicam  7.5 mg daily for inflammation, which he believes is helping. He has also tried magnesium  oil on his hands to alleviate pain.   A recent blood test showed a slightly elevated sedimentation rate of 38, and tests for rheumatoid arthritis markers were negative.         Previous HPI 12/20/23 Keith Vance is a 63 year old male who presents for evaluation of potential inflammatory arthritis. He was referred by his pulmonologist, Madelin Passy, for evaluation of lung nodules without clear etiology and joint problems.   He has lung nodules identified on imaging, accompanied by significant nocturnal dyspnea with oxygen  saturation levels dropping to 78-80%. He uses inhalers to manage these symptoms. A persistent cough lasting eight months has improved with over-the-counter cough syrup and medication. 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   He has a history of joint problems, particularly in his knees. A left knee replacement required revision surgery due to poor outcomes, yet he experiences worse pain than before. 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 He is hesitant to undergo further surgery unless necessary. Significant pain  in the right knee affects his ability to dress and perform daily activities. He also reports pain in the left shoulder, elbow, hands, and feet, but not in the right shoulder or elbow. Pain management includes pain sap used two to three times daily, meloxicam , and ibuprofen. He has had fluid drained from his knees in the past, with the last procedure occurring about a year ago.   His joint issues are exacerbated by a history of handling horses, which involved numerous  injuries, including broken ribs. He can no longer ride horses due to pain. Significant swelling occurs in his legs, particularly the right leg, which swells more than the left. No history of blood clots or varicose veins.   No recent injections or surgeries for his joint issues and he has not been on medications like duloxetine , gabapentin , or Lyrica.    Review of Systems  Constitutional:  Negative for fatigue.  HENT:  Negative for mouth sores and mouth dryness.   Eyes:  Negative for dryness.  Respiratory:  Negative for shortness of breath.   Cardiovascular:  Negative for chest pain and palpitations.  Gastrointestinal:  Negative for blood in stool, constipation and diarrhea.  Endocrine: Negative for increased urination.  Genitourinary:  Negative for involuntary urination.  Musculoskeletal:  Positive for joint pain, gait problem, joint pain, joint swelling, myalgias, muscle weakness, morning stiffness, muscle tenderness and myalgias.  Skin:  Negative for color change, rash, hair loss and sensitivity to sunlight.  Allergic/Immunologic: Negative for susceptible to infections.  Neurological:  Negative for dizziness and headaches.  Hematological:  Negative for swollen glands.  Psychiatric/Behavioral:  Positive for sleep disturbance. Negative for depressed mood. The patient is not nervous/anxious.     PMFS History:  Patient Active Problem List   Diagnosis Date Noted   Cellulitis 03/21/2024   Lymphadenopathy, mediastinal 11/27/2023   Pulmonary nodule 04/05/2023   Hemoptysis 04/05/2023   Other insomnia 02/12/2023   Vasomotor rhinitis 02/26/2022   Class 3 severe obesity with serious comorbidity and body mass index (BMI) of 40.0 to 44.9 in adult (HCC) 03/17/2021   Elevated coronary artery calcium  score 12/03/2020   Pre-diabetes 02/24/2019   Vitamin D  deficiency 02/24/2019   Dyslipidemia, goal LDL below 70 03/18/2018   Morbid obesity (HCC) 02/27/2018   NAFLD (nonalcoholic fatty liver  disease) 02/27/2018   Primary osteoarthritis of left knee 07/30/2017   Thoracic aortic aneurysm 01/07/2016   Essential hypertension 11/14/2015   Sleep apnea 03/07/2012    Past Medical History:  Diagnosis Date   Arthritis    Ascending aortic aneurysm    a. 10/2015 CTA chest: 4.4cm aneurysmal dil of Asc Ao-->rec f/u in 1 yr.   Back pain    Cellulitis    Chest pain 10/11/2018   Fatty liver    GERD (gastroesophageal reflux disease)    hx   Heart disease    History of kidney stones    Hyperlipidemia    Hypertension    Joint pain    Liver disease    Lower extremity edema    Obesity    Pneumonia    hx   Prediabetes    Rheumatoid arthritis (HCC)    SBO (small bowel obstruction) (HCC)    a. 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   Sleep apnea    cpap    Family History  Problem Relation Age of Onset   Cancer Mother    Hypertension Mother    Hyperlipidemia Mother    Stroke Mother    Heart disease Mother  Depression Mother    Hypertension Father    Diabetes Father    Cancer Father        bone   Hyperlipidemia Father    Heart attack Maternal Grandmother        >55s of age   Past Surgical History:  Procedure Laterality Date   HERNIA REPAIR Left    inguinal, and umbilical   JOINT REPLACEMENT     REVISION TOTAL KNEE ARTHROPLASTY Left 07/30/2017   LEFT TOTAL KNEE PARTIAL REVISION AND SYNOVECTOMY/notes 07/30/2017   TOTAL KNEE ARTHROPLASTY Left 04/24/2016   Procedure: LEFT TOTAL KNEE ARTHROPLASTY;  Surgeon: Donnice Car, MD;  Location: [facility];  Service: Orthopedics;  Laterality: Left;   TOTAL KNEE REVISION Left 07/30/2017   Procedure: LEFT TOTAL KNEE PARTIAL REVISION AND SYNOVECTOMY;  Surgeon: Liam Lerner, MD;  Location: [facility];  Service: Orthopedics;  Laterality: Left;   Social History   Social History Narrative   Not on file   Immunization History  Administered Date(s) Administered   Hepatitis B, ADULT 09/09/2013, 10/10/2013, 03/10/2014   Influenza Inj Mdck Quad Pf 06/09/2016,  05/17/2018   Influenza,inj,Quad PF,6+ Mos 06/08/2017, 05/29/2019, 05/10/2022   Influenza-Unspecified 05/25/2021   PFIZER(Purple Top)SARS-COV-2 Vaccination 09/18/2019, 10/09/2019, 07/08/2020   Pneumococcal Polysaccharide-23 02/28/2015   Tdap 12/14/2021     Objective: Vital Signs: BP 126/73 (BP Location: Right Arm, Patient Position: Sitting, Cuff Size: Large)   Pulse 69   Temp 98 F (36.7 C)   Resp 13   Ht 5' 9.5 (1.765 m)   Wt (!) 301 lb 9.6 oz (136.8 kg)   BMI 43.90 kg/m    Physical Exam Eyes:     Conjunctiva/sclera: Conjunctivae normal.  Cardiovascular:     Rate and Rhythm: Normal rate and regular rhythm.  Pulmonary:     Effort: Pulmonary effort is normal.     Breath sounds: Normal breath sounds.  Lymphadenopathy:     Cervical: No cervical adenopathy.  Skin:    General: Skin is warm and dry.  Neurological:     Mental Status: He is alert.  Psychiatric:        Mood and Affect: Mood normal.      Musculoskeletal Exam:  Shoulders full ROM no tenderness or swelling Elbows full ROM, left elbow tenderness to pressure without palpable swelling Wrists full ROM right wrist tenderness to pressure on radial side/1st dorsal compartment Fingers full ROM no tenderness or swelling To medial joint line tenderness to pressure, chronic knee change with some widening right greater than left but no large palpable effusion Ankles full ROM no tenderness or swelling MTPs full ROM no tenderness or swelling  Investigation: No additional findings.  Imaging: No results found.  Recent Labs: Lab Results  Component Value Date   WBC 7.9 03/18/2024   HGB 15.1 03/18/2024   PLT 224 03/18/2024   NA 138 03/18/2024   K 4.4 03/18/2024   CL 97 03/18/2024   CO2 23 03/18/2024   GLUCOSE 124 (H) 03/18/2024   BUN 26 03/18/2024   CREATININE 1.36 (H) 03/18/2024   BILITOT 0.8 03/18/2024   ALKPHOS 122 (H) 03/18/2024   AST 72 (H) 03/18/2024   ALT 65 (H) 03/18/2024   PROT 7.6 03/18/2024    ALBUMIN 4.3 03/18/2024   CALCIUM  9.8 03/18/2024   GFRAA 92 01/02/2020    Speciality Comments: No specialty comments available.  Procedures:  No procedures performed Allergies: Patient has no known allergies.   Assessment / Plan:     Visit Diagnoses: Generalized osteoarthritis  of multiple sites - Plan: pregabalin (LYRICA) 75 MG capsule Chronic polyosteoarthritis with persistent pain, particularly in knees and joints. Current treatment with meloxicam  and Cymbalta  ineffective. Cymbalta  not preferred long-term. - Discontinue Cymbalta  by tapering to once daily for two weeks, then stop. - Initiate Lyrica at 75 mg twice daily after discontinuing Cymbalta . Monitor for side effects such as leg swelling, drowsiness, or sleepiness. Start low with potential to increase. - Continue meloxicam  as previously prescribed.  Knee pain and effusion Chronic knee pain and effusion likely related to osteoarthritis. Artificial joints eliminate bone contact but tendons, ligaments, and joint lining can be affected. - Continue current management and monitor symptoms.  De Quervain's tenosynovitis of the right wrist De Quervain's tenosynovitis likely use-related. Symptoms include pain with certain wrist movements. - Provided education on De Quervain's tenosynovitis. - Consider short course of steroids if symptoms worsen; contact office if needed.        Orders: No orders of the defined types were placed in this encounter.  Meds ordered this encounter  Medications   pregabalin (LYRICA) 75 MG capsule    Sig: Take 1 capsule (75 mg total) by mouth 2 (two) times daily.    Dispense:  60 capsule    Refill:  2     Follow-Up Instructions: Return in about 3 months (around 09/30/2024) for OA LYR start f/u 3mos.   Lonni LELON Ester, MD  Note - This record has been created using [employer].  Chart creation errors have been sought, but may not always  have been located. Such creation errors do not reflect  on  the standard of medical care.

## 2024-06-30 ENCOUNTER — Encounter: Payer: Self-pay | Admitting: Internal Medicine

## 2024-06-30 ENCOUNTER — Ambulatory Visit: Attending: Internal Medicine | Admitting: Internal Medicine

## 2024-06-30 VITALS — BP 126/73 | HR 69 | Temp 98.0°F | Resp 13 | Ht 69.5 in | Wt 301.6 lb

## 2024-06-30 DIAGNOSIS — M25469 Effusion, unspecified knee: Secondary | ICD-10-CM

## 2024-06-30 DIAGNOSIS — M159 Polyosteoarthritis, unspecified: Secondary | ICD-10-CM

## 2024-06-30 MED ORDER — PREGABALIN 75 MG PO CAPS: 75.0000 mg | ORAL_CAPSULE | Freq: Two times a day (BID) | ORAL | 2 refills | Status: AC

## 2024-07-09 ENCOUNTER — Emergency Department (HOSPITAL_BASED_OUTPATIENT_CLINIC_OR_DEPARTMENT_OTHER)
Admission: EM | Admit: 2024-07-09 | Discharge: 2024-07-09 | Disposition: A | Discharge status: Home / Self Care | Attending: Emergency Medicine | Admitting: Emergency Medicine

## 2024-07-09 ENCOUNTER — Emergency Department (HOSPITAL_BASED_OUTPATIENT_CLINIC_OR_DEPARTMENT_OTHER): Admitting: Radiology

## 2024-07-09 ENCOUNTER — Encounter (HOSPITAL_BASED_OUTPATIENT_CLINIC_OR_DEPARTMENT_OTHER): Payer: Self-pay | Admitting: Emergency Medicine

## 2024-07-09 ENCOUNTER — Other Ambulatory Visit: Payer: Self-pay

## 2024-07-09 DIAGNOSIS — Z7982 Long term (current) use of aspirin: Secondary | ICD-10-CM | POA: Diagnosis not present

## 2024-07-09 DIAGNOSIS — Z23 Encounter for immunization: Secondary | ICD-10-CM | POA: Diagnosis not present

## 2024-07-09 DIAGNOSIS — L03012 Cellulitis of left finger: Secondary | ICD-10-CM | POA: Diagnosis not present

## 2024-07-09 DIAGNOSIS — R2232 Localized swelling, mass and lump, left upper limb: Secondary | ICD-10-CM | POA: Diagnosis present

## 2024-07-09 DIAGNOSIS — Z79899 Other long term (current) drug therapy: Secondary | ICD-10-CM | POA: Diagnosis not present

## 2024-07-09 DIAGNOSIS — I119 Hypertensive heart disease without heart failure: Secondary | ICD-10-CM | POA: Diagnosis not present

## 2024-07-09 MED ORDER — DOXYCYCLINE HYCLATE 100 MG PO TABS
100.0000 mg | ORAL_TABLET | Freq: Once | ORAL | Status: AC
  Administered 2024-07-09: 100 mg via ORAL
  Filled 2024-07-09: qty 1

## 2024-07-09 MED ORDER — DOXYCYCLINE HYCLATE 100 MG PO CAPS: 100.0000 mg | ORAL_CAPSULE | Freq: Two times a day (BID) | ORAL | 0 refills | Status: AC

## 2024-07-09 MED ORDER — TETANUS-DIPHTH-ACELL PERTUSSIS 5-2-15.5 LF-MCG/0.5 IM SUSP
0.5000 mL | Freq: Once | INTRAMUSCULAR | Status: AC
  Administered 2024-07-09: 0.5 mL via INTRAMUSCULAR
  Filled 2024-07-09: qty 0.5

## 2024-07-09 NOTE — Discharge Instructions (Addendum)
 Take the antibiotics as discussed.  Keep your finger clean and dry.  Use warm soaks or warm compresses several times a day to help encourage any drainage.  Follow-up with a hand surgeon listed above if your symptoms are not improving.  Return to the emergency room if you have any worsening symptoms such as increased redness, swelling or drainage

## 2024-07-09 NOTE — ED Triage Notes (Signed)
 Left index finger swelling and redness x 2 days. Denies injury to area. Endorses yellow drainiage

## 2024-07-09 NOTE — ED Provider Notes (Signed)
 [place] EMERGENCY DEPARTMENT AT [facility] Provider Note   CSN: [id number] Arrival date & time: 07/09/24  9253     Patient presents with: Finger Swelling   Keith Vance is a 63 y.o. male.   Patient is a 63 year old male who presents with pain and swelling from his left index finger that started about 2 days ago.  He does a lot of work with his hands and so he has frequent scratches in his hands.  He said that this morning he noticed a little bit of clear drainage from his knuckle and when he bent it there was a small amount of purulent drainage that came out.  He denies any other known injuries.  No fevers.  His tetanus shot is not up-to-date.       Prior to Admission medications   Medication Sig Start Date End Date Taking? Authorizing Provider  doxycycline  (VIBRAMYCIN ) 100 MG capsule Take 1 capsule (100 mg total) by mouth 2 (two) times daily. One po bid x 7 days 07/09/24  Yes Lenor Hollering, MD  albuterol  (VENTOLIN  HFA) 108 (90 Base) MCG/ACT inhaler INHALE 1 TO 2 PUFFS INTO THE LUNGS EVERY 6 HOURS AS NEEDED 02/27/24   Parrett, Madelin RAMAN, NP  amLODipine  (NORVASC ) 10 MG tablet TAKE 1 TABLET(10 MG) BY MOUTH DAILY 03/21/24   Gayle Numbers F, PA-C  aspirin  EC 81 MG EC tablet Take 1 tablet (81 mg total) by mouth daily. 10/13/18   Rosario Leatrice FERNS, MD  atorvastatin  (LIPITOR) 40 MG tablet Take 1 tablet (40 mg total) by mouth daily. 03/21/24   Gayle Numbers FALCON, PA-C  carvedilol  (COREG ) 12.5 MG tablet TAKE 1 TABLET(12.5 MG) BY MOUTH TWICE DAILY WITH A MEAL 03/21/24   Clapp, Kara F, PA-C  Docusate Calcium  (STOOL SOFTENER PO) Take by mouth.    [provider]  eszopiclone  (LUNESTA ) 1 MG TABS tablet Take 1 tablet (1 mg total) by mouth at bedtime. Take immediately before bedtime 05/28/24   Gayle Numbers F, PA-C  fluticasone  furoate-vilanterol (BREO ELLIPTA ) 100-25 MCG/ACT AEPB Inhale 1 puff into the lungs daily. 11/27/23   Parrett, Madelin RAMAN, NP  loratadine (CLARITIN) 10 MG tablet Take 10  mg by mouth daily. Patient not taking: Reported on 06/30/2024    [provider]  losartan -hydrochlorothiazide  (HYZAAR) 100-25 MG tablet Take 1 tablet by mouth daily. 03/21/24   Gayle Numbers FALCON, PA-C  meloxicam  (MOBIC ) 7.5 MG tablet TAKE 1 TABLET(7.5 MG) BY MOUTH DAILY 03/21/24   Clapp, Kara F, PA-C  Multiple Vitamins-Minerals (MULTIVITAMIN WITH MINERALS) tablet Take 1 tablet by mouth daily.    [provider]  Omega-3 Fatty Acids (FISH OIL) 1200 MG CAPS Take 1,200 mg by mouth daily.    [provider]  pregabalin (LYRICA) 75 MG capsule Take 1 capsule (75 mg total) by mouth 2 (two) times daily. 06/30/24   Jeannetta Lonni ORN, MD  sildenafil  (VIAGRA ) 25 MG tablet TAKE 1 TABLET(25 MG) BY MOUTH DAILY AS NEEDED FOR ERECTILE DYSFUNCTION 03/21/24   Gayle Numbers FALCON, PA-C    Allergies: Patient has no known allergies.    Review of Systems  Constitutional:  Negative for fever.  Gastrointestinal:  Negative for nausea and vomiting.  Musculoskeletal:  Positive for arthralgias and joint swelling. Negative for back pain and neck pain.  Skin:  Negative for wound.  Neurological:  Negative for weakness, numbness and headaches.    Updated Vital Signs BP 124/73 (BP Location: Right Arm)   Pulse 68   Temp 98.9  F (37.2 C) (Oral)   Resp 18   SpO2 99%   Physical Exam Constitutional:      Appearance: He is well-developed.  HENT:     Head: Normocephalic and atraumatic.  Cardiovascular:     Rate and Rhythm: Normal rate.  Pulmonary:     Effort: Pulmonary effort is normal.  Musculoskeletal:        General: Tenderness present.     Cervical back: Normal range of motion and neck supple.     Comments: Patient has redness to the left index finger extending down to the hand but not past the wrist.  There is some localized tenderness to the dorsum of the finger.  I do not feel any induration or fluctuance.  There is no drainage.  There are some superficial abrasions to the area.  He mild pain  on range of motion of the finger but mostly just localized tenderness to the dorsum of the finger.  Skin:    General: Skin is warm and dry.  Neurological:     Mental Status: He is alert and oriented to person, place, and time.     (all labs ordered are listed, but only abnormal results are displayed) Labs Reviewed - No data to display  EKG: None  Radiology: DG Finger Index Left Result Date: 07/09/2024 EXAM: 3 VIEW(S) XRAY OF THE FINGER(S) 07/09/2024 08:23:00 AM COMPARISON: None available. CLINICAL HISTORY: swelling of finger FINDINGS: BONES AND JOINTS: No acute fracture. No focal osseous lesion. Mild joint space loss of both interphalangeal joints. SOFT TISSUES: Mild soft tissue swelling about the finger. IMPRESSION: 1. Mild soft tissue swelling about the finger. No acute fracture or dislocation. 2. Mild osteoarthritis of the finger. Electronically signed by: Rogelia Myers MD 07/09/2024 08:49 AM EST RP Workstation: [id number]     Procedures   Medications Ordered in the ED  Tdap (ADACEL) injection 0.5 mL (0.5 mLs Intramuscular Given 07/09/24 0828)  doxycycline  (VIBRA -TABS) tablet 100 mg (100 mg Oral Given 07/09/24 9167)                                    Medical Decision Making Amount and/or Complexity of Data Reviewed Radiology: ordered.  Risk Prescription drug management.   This patient presents to the ED for concern of finger pain, swelling, this involves an extensive number of treatment options, and is a complaint that carries with it a high risk of complications and morbidity.  I considered the following differential and admission for this acute, potentially life threatening condition.  The differential diagnosis includes cellulitis, wound infection, septic arthritis, tenosynovitis, foreign body  MDM:    Patient is a 63 year old who presents with pain and swelling of his left index finger.  He has some localized tenderness, mostly over the PIP joint.  I do not  appreciate any palpable abscess.  He does not have significant pain or range of motion that would be more concerning for tenosynovitis.  Suspect he has localized cellulitis with possible early abscess.  I do not see anything that is drainable currently.  Will start him on antibiotics.  His tetanus shot was updated.  He was placed in a finger splint.  He was advised to do warm soaks at home.  X-ray does not reveal any bony injury or foreign body.  He was given strict return precautions to return to the ED if he has any increased swelling, pain, redness or other worsening  symptoms.  (Labs, imaging, consults)  Labs: I Ordered, and personally interpreted labs.  The pertinent results include: None  Imaging Studies ordered: I ordered imaging studies including index finger I independently visualized and interpreted imaging. I agree with the radiologist interpretation  Additional history obtained from  .  External records from outside source obtained and reviewed including    Cardiac Monitoring: The patient was not maintained on a cardiac monitor.  If on the cardiac monitor, I personally viewed and interpreted the cardiac monitored which showed an underlying rhythm of:    Reevaluation: After the interventions noted above, I reevaluated the patient and found that they have :stayed the same  Social Determinants of Health:    Disposition: Discharged to home  Co morbidities that complicate the patient evaluation  Past Medical History:  Diagnosis Date   Arthritis    Ascending aortic aneurysm    a. 10/2015 CTA chest: 4.4cm aneurysmal dil of Asc Ao-->rec f/u in 1 yr.   Back pain    Cellulitis    Chest pain 10/11/2018   Fatty liver    GERD (gastroesophageal reflux disease)    hx   Heart disease    History of kidney stones    Hyperlipidemia    Hypertension    Joint pain    Liver disease    Lower extremity edema    Obesity    Pneumonia    hx   Prediabetes    Rheumatoid arthritis (HCC)     SBO (small bowel obstruction) (HCC)    a. 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   Sleep apnea    cpap     Medicines Meds ordered this encounter  Medications   Tdap (ADACEL) injection 0.5 mL   doxycycline  (VIBRA -TABS) tablet 100 mg   doxycycline  (VIBRAMYCIN ) 100 MG capsule    Sig: Take 1 capsule (100 mg total) by mouth 2 (two) times daily. One po bid x 7 days    Dispense:  14 capsule    Refill:  0    I have reviewed the patients home medicines and have made adjustments as needed  Problem List / ED Course: Problem List Items Addressed This Visit       Other   Cellulitis - Primary             Final diagnoses:  Cellulitis of finger of left hand    ED Discharge Orders          Ordered    doxycycline  (VIBRAMYCIN ) 100 MG capsule  2 times daily        07/09/24 0909               Lenor Hollering, MD 07/09/24 [phone]

## 2024-07-18 ENCOUNTER — Other Ambulatory Visit: Payer: Self-pay | Admitting: Adult Health

## 2024-08-01 ENCOUNTER — Other Ambulatory Visit: Payer: Self-pay | Admitting: Family Medicine

## 2024-08-01 DIAGNOSIS — I10 Essential (primary) hypertension: Secondary | ICD-10-CM

## 2024-08-12 ENCOUNTER — Other Ambulatory Visit: Payer: Self-pay

## 2024-08-12 DIAGNOSIS — M1712 Unilateral primary osteoarthritis, left knee: Secondary | ICD-10-CM

## 2024-08-13 ENCOUNTER — Other Ambulatory Visit: Payer: Self-pay | Admitting: Adult Health

## 2024-08-14 ENCOUNTER — Other Ambulatory Visit: Payer: Self-pay

## 2024-08-14 DIAGNOSIS — G4709 Other insomnia: Secondary | ICD-10-CM

## 2024-09-11 DIAGNOSIS — G4733 Obstructive sleep apnea (adult) (pediatric): Secondary | ICD-10-CM

## 2024-09-15 ENCOUNTER — Other Ambulatory Visit: Payer: Self-pay

## 2024-09-15 DIAGNOSIS — I10 Essential (primary) hypertension: Secondary | ICD-10-CM

## 2024-09-15 DIAGNOSIS — E785 Hyperlipidemia, unspecified: Secondary | ICD-10-CM

## 2024-09-24 NOTE — Progress Notes (Unsigned)
 "  Office Visit Note  Patient: Keith Vance             Date of Birth: 16-Feb-1961           MRN: [id number]             PCP: Gayle Saddie JULIANNA DEVONNA Referring: Gayle Saddie JULIANNA DEVONNA Visit Date: 10/06/2024   Subjective:  No chief complaint on file.   History of Present Illness: Keith Vance is a 64 y.o. male here for follow up with osteoarthritis who presents with joint pain and swelling.    Previous HPI 06/30/2024 Keith Vance is a 64 y.o. male here for follow up with osteoarthritis who presents with joint pain and swelling.    He experiences persistent joint pain, particularly in his knees and wrists, despite ongoing treatment with low-dose meloxicam  and a trial of Cymbalta , which has not made a noticeable difference in his symptoms. Nocturnal pain in his knees and joints affects his sleep.   He describes a new onset of pain in his wrist, present for about four weeks, exacerbated by movements such as bending the hand down, and localized to the wrist without radiation up the arm. No recent trauma or unusual activity is noted. Wearing a wrist brace at night alleviates the pain, allowing him to sleep without disturbance.   His knees are described as 'terrible,' with worsening swelling and some redness around the ankle. He attributes some of the discomfort to his demanding work schedule, noting that he worked 89 hours last week and expects a similar workload this week.        Previous HPI 03/26/2024 Keith Vance is a 64 year old male with osteoarthritis who presents with joint pain and swelling.   He experiences persistent joint pain and swelling, particularly in his knees and fingers. His artificial knee has not been successful, and both knees remain swollen, with the right knee being worse. He has difficulty with activities such as putting on shoes due to knee swelling.   He describes morning stiffness in his fingers, particularly in three fingers, which requires him to manually open  them. This stiffness improves as the day progresses but worsens after periods of inactivity, such as watching TV. He finds relief by applying ice to his knees after work.   He is currently taking meloxicam  7.5 mg daily for inflammation, which he believes is helping. He has also tried magnesium  oil on his hands to alleviate pain.   A recent blood test showed a slightly elevated sedimentation rate of 38, and tests for rheumatoid arthritis markers were negative.         Previous HPI 12/20/23 Keith Vance is a 64 year old male who presents for evaluation of potential inflammatory arthritis. He was referred by his pulmonologist, Madelin Passy, for evaluation of lung nodules without clear etiology and joint problems.   He has lung nodules identified on imaging, accompanied by significant nocturnal dyspnea with oxygen  saturation levels dropping to 78-80%. He uses inhalers to manage these symptoms. A persistent cough lasting eight months has improved with over-the-counter cough syrup and medication. 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   He has a history of joint problems, particularly in his knees. A left knee replacement required revision surgery due to poor outcomes, yet he experiences worse pain than before. 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 He is hesitant to undergo further surgery unless necessary. Significant pain in the right knee affects his ability to dress and perform daily activities. He also reports pain in the left shoulder, elbow, hands, and feet, but not in the right shoulder or elbow. Pain management includes pain sap used two to three times daily, meloxicam , and ibuprofen. He has had fluid drained from his knees in the past, with the last procedure occurring about a year ago.   His joint issues are exacerbated by a history of handling  horses, which involved numerous injuries, including broken ribs. He can no longer ride horses due to pain. Significant swelling occurs in his legs, particularly the right leg, which swells more than the left. No history of blood clots or varicose veins.   No recent injections or surgeries for his joint issues and he has not been on medications like duloxetine , gabapentin , or Lyrica .    No Rheumatology ROS completed.   PMFS History:  Patient Active Problem List   Diagnosis Date Noted   Cellulitis 03/21/2024   Lymphadenopathy, mediastinal 11/27/2023   Pulmonary nodule 04/05/2023   Hemoptysis 04/05/2023   Other insomnia 02/12/2023   Vasomotor rhinitis 02/26/2022   Class 3 severe obesity with serious comorbidity and body mass index (BMI) of 40.0 to 44.9 in adult (HCC) 03/17/2021   Elevated coronary artery calcium  score 12/03/2020   Pre-diabetes 02/24/2019   Vitamin D  deficiency 02/24/2019   Dyslipidemia, goal LDL below 70 03/18/2018   Morbid obesity (HCC) 02/27/2018   NAFLD (nonalcoholic fatty liver disease) 92/96/7980   Primary osteoarthritis of left knee 07/30/2017   Thoracic aortic aneurysm 01/07/2016   Essential hypertension 11/14/2015   Sleep apnea 03/07/2012    Past Medical History:  Diagnosis Date   Arthritis    Ascending aortic aneurysm    a. 10/2015 CTA chest: 4.4cm aneurysmal dil of Asc Ao-->rec f/u in 1 yr.   Back pain    Cellulitis    Chest pain 10/11/2018   Fatty liver    GERD (gastroesophageal reflux disease)    hx   Heart disease    History of kidney stones    Hyperlipidemia    Hypertension    Joint pain    Liver disease    Lower extremity edema    Obesity    Pneumonia    hx   Prediabetes    Rheumatoid arthritis (HCC)    SBO (small bowel obstruction) (HCC)    a. 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   Sleep apnea    cpap    Family History  Problem Relation Age of Onset   Cancer Mother    Hypertension Mother    Hyperlipidemia Mother    Stroke Mother    Heart  disease Mother    Depression Mother    Hypertension Father    Diabetes Father    Cancer Father        bone   Hyperlipidemia Father    Heart attack Maternal Grandmother        >65s of age   Past Surgical History:  Procedure Laterality Date   HERNIA REPAIR Left    inguinal, and umbilical   JOINT REPLACEMENT     REVISION TOTAL KNEE ARTHROPLASTY Left 07/30/2017   LEFT TOTAL KNEE PARTIAL REVISION AND SYNOVECTOMY/notes 07/30/2017   TOTAL KNEE ARTHROPLASTY Left 04/24/2016   Procedure: LEFT TOTAL KNEE ARTHROPLASTY;  Surgeon: Donnice Car, MD;  Location: [facility];  Service: Orthopedics;  Laterality: Left;   TOTAL KNEE REVISION Left 07/30/2017  Procedure: LEFT TOTAL KNEE PARTIAL REVISION AND SYNOVECTOMY;  Surgeon: Liam Lerner, MD;  Location: [facility];  Service: Orthopedics;  Laterality: Left;   Social History   Social History Narrative   Not on file   Immunization History  Administered Date(s) Administered   Hepatitis B, ADULT 09/09/2013, 10/10/2013, 03/10/2014   Influenza Inj Mdck Quad Pf 06/09/2016, 05/17/2018   Influenza,inj,Quad PF,6+ Mos 06/08/2017, 05/29/2019, 05/10/2022   Influenza-Unspecified 05/25/2021   PFIZER(Purple Top)SARS-COV-2 Vaccination 09/18/2019, 10/09/2019, 07/08/2020   Pneumococcal Polysaccharide-23 02/28/2015   Tdap 12/14/2021, 07/09/2024     Objective: Vital Signs: There were no vitals taken for this visit.   Physical Exam   Musculoskeletal Exam: ***   Investigation: No additional findings.  Imaging: No results found.  Recent Labs: Lab Results  Component Value Date   WBC 7.9 03/18/2024   HGB 15.1 03/18/2024   PLT 224 03/18/2024   NA 138 03/18/2024   K 4.4 03/18/2024   CL 97 03/18/2024   CO2 23 03/18/2024   GLUCOSE 124 (H) 03/18/2024   BUN 26 03/18/2024   CREATININE 1.36 (H) 03/18/2024   BILITOT 0.8 03/18/2024   ALKPHOS 122 (H) 03/18/2024   AST 72 (H) 03/18/2024   ALT 65 (H) 03/18/2024   PROT 7.6 03/18/2024   ALBUMIN 4.3 03/18/2024    CALCIUM  9.8 03/18/2024   GFRAA 92 01/02/2020    Speciality Comments: No specialty comments available.  Procedures:  No procedures performed Allergies: Patient has no known allergies.   Assessment / Plan:     Visit Diagnoses:  Assessment & Plan Generalized osteoarthritis of multiple sites      ***  Follow-Up Instructions: No follow-ups on file.   Aloysius Heinle M Rissa Turley, CMA  Note - This record has been created using Animal nutritionist.  Chart creation errors have been sought, but may not always  have been located. Such creation errors do not reflect on  the standard of medical care. "

## 2024-10-06 ENCOUNTER — Ambulatory Visit: Admitting: Internal Medicine

## 2024-10-06 DIAGNOSIS — M159 Polyosteoarthritis, unspecified: Secondary | ICD-10-CM
